# Patient Record
Sex: Male | Born: 1985 | Race: Black or African American | Hispanic: No | Marital: Single | Smoking: Former smoker
Health system: Southern US, Community
[De-identification: ages and names within clinical notes are randomized; demographics above are authoritative.]

## PROBLEM LIST (undated history)

## (undated) ENCOUNTER — Emergency Department (HOSPITAL_COMMUNITY): Admission: EM | Discharge status: Against Medical Advice | Payer: Self-pay

## (undated) DIAGNOSIS — K219 Gastro-esophageal reflux disease without esophagitis: Secondary | ICD-10-CM

## (undated) DIAGNOSIS — R578 Other shock: Secondary | ICD-10-CM

## (undated) DIAGNOSIS — S61512A Laceration without foreign body of left wrist, initial encounter: Secondary | ICD-10-CM

---
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
## 2012-11-20 DIAGNOSIS — D649 Anemia, unspecified: Secondary | ICD-10-CM

## 2012-11-20 DIAGNOSIS — Z9289 Personal history of other medical treatment: Secondary | ICD-10-CM

## 2012-11-20 HISTORY — DX: Personal history of other medical treatment: Z92.89

## 2012-11-20 HISTORY — DX: Anemia, unspecified: D64.9

## 2012-12-11 ENCOUNTER — Observation Stay (HOSPITAL_COMMUNITY)
Admission: EM | Admit: 2012-12-11 | Discharge: 2012-12-12 | Disposition: A | Discharge status: Home / Self Care | Payer: MEDICAID | Attending: General Surgery | Admitting: General Surgery

## 2012-12-11 DIAGNOSIS — R571 Hypovolemic shock: Secondary | ICD-10-CM

## 2012-12-11 DIAGNOSIS — S61509A Unspecified open wound of unspecified wrist, initial encounter: Principal | ICD-10-CM | POA: Insufficient documentation

## 2012-12-11 DIAGNOSIS — S55109A Unspecified injury of radial artery at forearm level, unspecified arm, initial encounter: Secondary | ICD-10-CM | POA: Insufficient documentation

## 2012-12-11 DIAGNOSIS — T794XXA Traumatic shock, initial encounter: Secondary | ICD-10-CM | POA: Insufficient documentation

## 2012-12-11 DIAGNOSIS — S61512A Laceration without foreign body of left wrist, initial encounter: Secondary | ICD-10-CM

## 2012-12-11 DIAGNOSIS — R578 Other shock: Secondary | ICD-10-CM | POA: Diagnosis present

## 2012-12-11 DIAGNOSIS — S66909A Unspecified injury of unspecified muscle, fascia and tendon at wrist and hand level, unspecified hand, initial encounter: Principal | ICD-10-CM | POA: Insufficient documentation

## 2012-12-11 DIAGNOSIS — D62 Acute posthemorrhagic anemia: Secondary | ICD-10-CM

## 2012-12-11 HISTORY — DX: Other shock: R57.8

## 2012-12-11 HISTORY — DX: Laceration without foreign body of left wrist, initial encounter: S61.512A

## 2012-12-12 ENCOUNTER — Encounter (HOSPITAL_COMMUNITY): Payer: Self-pay | Admitting: *Deleted

## 2012-12-12 ENCOUNTER — Emergency Department (HOSPITAL_COMMUNITY): Payer: Self-pay

## 2012-12-12 ENCOUNTER — Encounter (HOSPITAL_COMMUNITY): Admission: EM | Disposition: A | Discharge status: Home / Self Care | Payer: Self-pay | Source: Home / Self Care

## 2012-12-12 ENCOUNTER — Emergency Department (HOSPITAL_COMMUNITY): Payer: Self-pay | Admitting: Anesthesiology

## 2012-12-12 ENCOUNTER — Encounter (HOSPITAL_COMMUNITY): Payer: Self-pay | Admitting: Anesthesiology

## 2012-12-12 DIAGNOSIS — T794XXA Traumatic shock, initial encounter: Secondary | ICD-10-CM

## 2012-12-12 DIAGNOSIS — R578 Other shock: Secondary | ICD-10-CM | POA: Diagnosis present

## 2012-12-12 DIAGNOSIS — S61512A Laceration without foreign body of left wrist, initial encounter: Secondary | ICD-10-CM

## 2012-12-12 DIAGNOSIS — S61509A Unspecified open wound of unspecified wrist, initial encounter: Secondary | ICD-10-CM

## 2012-12-12 DIAGNOSIS — D62 Acute posthemorrhagic anemia: Secondary | ICD-10-CM

## 2012-12-12 HISTORY — DX: Other shock: R57.8

## 2012-12-12 HISTORY — PX: WOUND EXPLORATION: SHX6188

## 2012-12-12 HISTORY — DX: Laceration without foreign body of left wrist, initial encounter: S61.512A

## 2012-12-12 LAB — BASIC METABOLIC PANEL
CO2: 24 mEq/L (ref 19–32)
Calcium: 8.1 mg/dL — ABNORMAL LOW (ref 8.4–10.5)
Creatinine, Ser: 1.09 mg/dL (ref 0.50–1.35)
GFR calc Af Amer: >90 mL/min (ref >90)
Sodium: 139 mEq/L (ref 135–145)

## 2012-12-12 LAB — CBC
HCT: 35.7 % — ABNORMAL LOW (ref 39.0–52.0)
Hemoglobin: 14.3 g/dL (ref 13.0–17.0)
MCH: 28.4 pg (ref 26.0–34.0)
MCH: 28.6 pg (ref 26.0–34.0)
MCH: 28.9 pg (ref 26.0–34.0)
MCHC: 35.3 g/dL (ref 30.0–36.0)
MCV: 84.6 fL (ref 78.0–100.0)
Platelets: 148 10*3/uL — ABNORMAL LOW (ref 150–400)
RBC: 4.51 MIL/uL (ref 4.22–5.81)
RBC: 4.95 MIL/uL (ref 4.22–5.81)
RDW: 14.7 % (ref 11.5–15.5)
RDW: 14.8 % (ref 11.5–15.5)
WBC: 17.6 10*3/uL — ABNORMAL HIGH (ref 4.0–10.5)

## 2012-12-12 LAB — POCT I-STAT, CHEM 8
Chloride: 107 mEq/L (ref 96–112)
Glucose, Bld: 194 mg/dL — ABNORMAL HIGH (ref 70–99)
HCT: 38 % — ABNORMAL LOW (ref 39.0–52.0)
Potassium: 3.7 mEq/L (ref 3.5–5.1)
Sodium: 140 mEq/L (ref 135–145)

## 2012-12-12 LAB — COMPREHENSIVE METABOLIC PANEL
AST: 19 U/L (ref 0–37)
CO2: 19 mEq/L (ref 19–32)
Calcium: 7.5 mg/dL — ABNORMAL LOW (ref 8.4–10.5)
Creatinine, Ser: 1.13 mg/dL (ref 0.50–1.35)
GFR calc Af Amer: >90 mL/min (ref >90)
GFR calc non Af Amer: 87 mL/min — ABNORMAL LOW (ref >90)
Total Protein: 5 g/dL — ABNORMAL LOW (ref 6.0–8.3)

## 2012-12-12 LAB — POCT I-STAT 4, (NA,K, GLUC, HGB,HCT)
HCT: 38 % — ABNORMAL LOW (ref 39.0–52.0)
Hemoglobin: 12.9 g/dL — ABNORMAL LOW (ref 13.0–17.0)
Potassium: 4.1 mEq/L (ref 3.5–5.1)
Sodium: 140 mEq/L (ref 135–145)

## 2012-12-12 LAB — MRSA PCR SCREENING: MRSA by PCR: NEGATIVE

## 2012-12-12 LAB — MASSIVE TRANSFUSION PROTOCOL ORDER (BLOOD BANK NOTIFICATION)

## 2012-12-12 SURGERY — WOUND EXPLORATION
Anesthesia: General | Site: Arm Lower | Laterality: Left | Wound class: Dirty or Infected

## 2012-12-12 MED ORDER — DOCUSATE SODIUM 100 MG PO CAPS
100.0000 mg | ORAL_CAPSULE | Freq: Two times a day (BID) | ORAL | Status: DC
  Filled 2012-12-12: qty 1

## 2012-12-12 MED ORDER — SODIUM CHLORIDE 0.9 % IV SOLN
INTRAVENOUS | Status: AC | PRN
  Administered 2012-12-12 (×3): 1000 mL via INTRAVENOUS

## 2012-12-12 MED ORDER — PANTOPRAZOLE SODIUM 40 MG PO TBEC
40.0000 mg | DELAYED_RELEASE_TABLET | Freq: Every day | ORAL | Status: DC
  Filled 2012-12-12: qty 1

## 2012-12-12 MED ORDER — MORPHINE SULFATE 2 MG/ML IJ SOLN
1.0000 mg | INTRAMUSCULAR | Status: DC | PRN
  Administered 2012-12-12 (×3): 4 mg via INTRAVENOUS
  Filled 2012-12-12 (×3): qty 2

## 2012-12-12 MED ORDER — ACETAMINOPHEN 325 MG PO TABS: 650.0000 mg | ORAL_TABLET | Freq: Four times a day (QID) | ORAL | Status: DC | PRN

## 2012-12-12 MED ORDER — CEFAZOLIN SODIUM-DEXTROSE 2-3 GM-% IV SOLR
INTRAVENOUS | Status: DC | PRN
  Administered 2012-12-12: 2 g via INTRAVENOUS

## 2012-12-12 MED ORDER — OXYCODONE HCL 5 MG PO TABS: 5.0000 mg | ORAL_TABLET | Freq: Four times a day (QID) | ORAL | Status: DC | PRN

## 2012-12-12 MED ORDER — BUPIVACAINE HCL 0.5 % IJ SOLN
INTRAMUSCULAR | Status: DC | PRN
  Administered 2012-12-12: 10 mL

## 2012-12-12 MED ORDER — DEXTROSE 50 % IV SOLN
INTRAVENOUS | Status: AC
  Filled 2012-12-12: qty 50

## 2012-12-12 MED ORDER — PROMETHAZINE HCL 25 MG/ML IJ SOLN: 6.2500 mg | INTRAMUSCULAR | Status: DC | PRN

## 2012-12-12 MED ORDER — OXYCODONE HCL 5 MG PO TABS: 2.5000 mg | ORAL_TABLET | ORAL | Status: DC | PRN

## 2012-12-12 MED ORDER — TETANUS-DIPHTH-ACELL PERTUSSIS 5-2.5-18.5 LF-MCG/0.5 IM SUSP
0.5000 mL | Freq: Once | INTRAMUSCULAR | Status: AC
  Administered 2012-12-12: 0.5 mL via INTRAMUSCULAR

## 2012-12-12 MED ORDER — SODIUM CHLORIDE 0.9 % IV SOLN
INTRAVENOUS | Status: DC | PRN
  Administered 2012-12-12: 01:00:00 via INTRAVENOUS

## 2012-12-12 MED ORDER — FENTANYL CITRATE 0.05 MG/ML IJ SOLN
INTRAMUSCULAR | Status: DC | PRN
  Administered 2012-12-12 (×2): 50 ug via INTRAVENOUS

## 2012-12-12 MED ORDER — PANTOPRAZOLE SODIUM 40 MG IV SOLR
40.0000 mg | Freq: Every day | INTRAVENOUS | Status: DC
  Filled 2012-12-12: qty 40

## 2012-12-12 MED ORDER — ONDANSETRON HCL 4 MG/2ML IJ SOLN
INTRAMUSCULAR | Status: DC | PRN
  Administered 2012-12-12: 4 mg via INTRAVENOUS

## 2012-12-12 MED ORDER — ONDANSETRON HCL 4 MG PO TABS: 4.0000 mg | ORAL_TABLET | Freq: Four times a day (QID) | ORAL | Status: DC | PRN

## 2012-12-12 MED ORDER — OXYCODONE HCL 5 MG PO TABS: 5.0000 mg | ORAL_TABLET | Freq: Once | ORAL | Status: DC | PRN

## 2012-12-12 MED ORDER — LIDOCAINE HCL (CARDIAC) 20 MG/ML IV SOLN
INTRAVENOUS | Status: DC | PRN
  Administered 2012-12-12: 100 mg via INTRAVENOUS

## 2012-12-12 MED ORDER — HYDROMORPHONE HCL PF 1 MG/ML IJ SOLN: 0.2500 mg | INTRAMUSCULAR | Status: DC | PRN

## 2012-12-12 MED ORDER — TETANUS-DIPHTH-ACELL PERTUSSIS 5-2.5-18.5 LF-MCG/0.5 IM SUSP
INTRAMUSCULAR | Status: AC
  Filled 2012-12-12: qty 0.5

## 2012-12-12 MED ORDER — OXYCODONE HCL 5 MG/5ML PO SOLN: 5.0000 mg | Freq: Once | ORAL | Status: DC | PRN

## 2012-12-12 MED ORDER — OXYCODONE HCL 5 MG PO TABS: 5.0000 mg | ORAL_TABLET | ORAL | Status: DC | PRN

## 2012-12-12 MED ORDER — DEXTROSE IN LACTATED RINGERS 5 % IV SOLN
INTRAVENOUS | Status: DC
  Administered 2012-12-12 (×2): via INTRAVENOUS

## 2012-12-12 MED ORDER — CEPHALEXIN 500 MG PO CAPS
500.0000 mg | ORAL_CAPSULE | Freq: Four times a day (QID) | ORAL | Status: DC
  Administered 2012-12-12: 500 mg via ORAL
  Filled 2012-12-12: qty 1

## 2012-12-12 MED ORDER — FENTANYL CITRATE 0.05 MG/ML IJ SOLN: 50.0000 ug | Freq: Once | INTRAMUSCULAR | Status: DC

## 2012-12-12 MED ORDER — LACTATED RINGERS IV SOLN
INTRAVENOUS | Status: DC | PRN
  Administered 2012-12-12: 02:00:00 via INTRAVENOUS

## 2012-12-12 MED ORDER — MIDAZOLAM HCL 2 MG/2ML IJ SOLN: 1.0000 mg | INTRAMUSCULAR | Status: DC | PRN

## 2012-12-12 MED ORDER — SUCCINYLCHOLINE CHLORIDE 20 MG/ML IJ SOLN
INTRAMUSCULAR | Status: DC | PRN
  Administered 2012-12-12: 140 mg via INTRAVENOUS

## 2012-12-12 MED ORDER — PROPOFOL 10 MG/ML IV BOLUS
INTRAVENOUS | Status: DC | PRN
  Administered 2012-12-12: 180 mg via INTRAVENOUS

## 2012-12-12 MED ORDER — ONDANSETRON HCL 4 MG/2ML IJ SOLN: 4.0000 mg | Freq: Four times a day (QID) | INTRAMUSCULAR | Status: DC | PRN

## 2012-12-12 MED ORDER — CEPHALEXIN 500 MG PO CAPS: 500.0000 mg | ORAL_CAPSULE | Freq: Four times a day (QID) | ORAL | Status: DC

## 2012-12-12 MED ORDER — OXYCODONE HCL 5 MG PO TABS: 10.0000 mg | ORAL_TABLET | ORAL | Status: DC | PRN

## 2012-12-12 MED ORDER — IBUPROFEN 200 MG PO TABS: ORAL_TABLET | ORAL | Status: DC

## 2012-12-12 MED ORDER — 0.9 % SODIUM CHLORIDE (POUR BTL) OPTIME
TOPICAL | Status: DC | PRN
  Administered 2012-12-12: 1000 mL

## 2012-12-12 SURGICAL SUPPLY — 33 items
BANDAGE ELASTIC 3 VELCRO ST LF (GAUZE/BANDAGES/DRESSINGS) ×1 IMPLANT
CANISTER SUCTION 2500CC (MISCELLANEOUS) ×1 IMPLANT
CLOTH BEACON ORANGE TIMEOUT ST (SAFETY) ×1 IMPLANT
CORDS BIPOLAR (ELECTRODE) ×1 IMPLANT
COVER LIGHT HANDLE  DEROYL (MISCELLANEOUS) ×1 IMPLANT
GAUZE XEROFORM 5X9 LF (GAUZE/BANDAGES/DRESSINGS) ×1 IMPLANT
GLOVE BIO SURGEON STRL SZ7 (GLOVE) ×1 IMPLANT
GLOVE BIOGEL M STRL SZ7.5 (GLOVE) ×1 IMPLANT
GLOVE BIOGEL PI IND STRL 7.0 (GLOVE) IMPLANT
GLOVE BIOGEL PI INDICATOR 7.0 (GLOVE) ×1
GLOVE SURG SS PI 7.0 STRL IVOR (GLOVE) ×2 IMPLANT
GOWN PREVENTION PLUS XLARGE (GOWN DISPOSABLE) ×1 IMPLANT
GOWN STRL NON-REIN LRG LVL3 (GOWN DISPOSABLE) ×1 IMPLANT
KIT BASIN OR (CUSTOM PROCEDURE TRAY) ×1 IMPLANT
NEEDLE 22X1 1/2 (OR ONLY) (NEEDLE) ×1 IMPLANT
PACK ORTHO EXTREMITY (CUSTOM PROCEDURE TRAY) ×1 IMPLANT
PADDING CAST ABS 4INX4YD NS (CAST SUPPLIES) ×1
PADDING CAST ABS COTTON 4X4 ST (CAST SUPPLIES) IMPLANT
SPLINT FIBERGLASS 3X12 (CAST SUPPLIES) ×1 IMPLANT
SPONGE GAUZE 4X4 12PLY (GAUZE/BANDAGES/DRESSINGS) ×1 IMPLANT
SPONGE LAP 4X18 X RAY DECT (DISPOSABLE) ×1 IMPLANT
STAPLER VISISTAT 35W (STAPLE) ×1 IMPLANT
STOCKINETTE 4X48 STRL (DRAPES) ×1 IMPLANT
SUT PROLENE 4 0 P 3 18 (SUTURE) ×1 IMPLANT
SUT PROLENE 4 0 PS 2 18 (SUTURE) ×1 IMPLANT
SUT PROLENE 5 0 RB 1 DA (SUTURE) ×1 IMPLANT
SUT VIC AB 4-0 P-3 18X BRD (SUTURE) IMPLANT
SUT VIC AB 4-0 P3 18 (SUTURE) ×4
SYR CONTROL 10ML LL (SYRINGE) ×1 IMPLANT
TOWEL OR 17X24 6PK STRL BLUE (TOWEL DISPOSABLE) ×1 IMPLANT
TOWEL OR 17X26 10 PK STRL BLUE (TOWEL DISPOSABLE) ×1 IMPLANT
TUBE SUCT ARGYLE STRL (TUBING) ×1 IMPLANT
YANKAUER SUCT BULB TIP NO VENT (SUCTIONS) ×1 IMPLANT

## 2012-12-12 NOTE — OR Nursing (Signed)
Crime Scene Investigator present in OR pre-operatively to take pictures of patient and wound with permission of surgeon.

## 2012-12-12 NOTE — Op Note (Signed)
Alejandro Shelton, Alejandro Shelton          ACCOUNT NO.:  [id number]  MEDICAL RECORD NO.:  [id number]  LOCATION:  OTFC                         FACILITY:  [facility]  PHYSICIAN:  Johnette Abraham, MD    DATE OF BIRTH:  10-22-1985  DATE OF PROCEDURE:  12/12/2012 DATE OF DISCHARGE:                              OPERATIVE REPORT   PREOPERATIVE DIAGNOSIS:  Complex laceration of the left wrist with hemodynamic instability.  POSTOPERATIVE DIAGNOSIS:  Complex laceration of the left wrist with hemodynamic instability.  PROCEDURE:  Exploration of complex wound, ligation of radial artery. Repair of the abductor pollicis longus tendon.  Repair of the extensor pollicis longus tendon.  Release of the first dorsal compartment with reconstruction of the first dorsal compartment.  Repair of the extensor pollicis brevis tendon, repair of ERCL.  INDICATIONS:  Alejandro Shelton is a black male, who presented to the emergency room with severe bleeding and hemodynamic instability.  On evaluation, he had a very large complex laceration to the distal wrist on the left side.  Upon entering the [facility], he was stabilized.  A tourniquet was inflated  on the left arm to control the bleeding from the laceration.  On my initial evaluation, the hand was cool.  The patient was unable to move the digits.  The dressing was not taken down at this time due to hemodynamic instability and active bleeding.  The patient was consented verbally and taken to the operating room.  DESCRIPTION OF PROCEDURE:  The patient was taken to the operating room, placed supine on the operating room table.  The originally tourniquet was released and a new tourniquet was placed and inflated.  The dressing was removed.  There was a large gaping wound overlying  the first dorsal compartment that extended volarly and dorsally.  There was obvious tendon involvement and most likely arterial injury due to the severity of the bleeding.  The wound was  lengthened both proximally and distally to gain adequate exploration of the structures.  Immediately there was evidence that there was a radial artery injury.  There was a skiving almost splaying of the radial artery lumen for approximately 1 inch. Primary repair was not possible and therefore each end was grasped and suture ligated. Next, further exploration was performed.  Volarly  the FCR tendon was intact and the median nerve was intact.  There was laceration and shredding of the radial dorsal sensory branch. Several branches were resected and placed deep in the soft tissue to avoid neuroma.  There was laceration to both tendons in the first dorsal compartment, each of these were grasped and repaired with modified Kessler repair with 4-0 FiberWire and a epitendinous 5-0 Prolene suture.  The first dorsal compartment was then taken down in a stepwise fashion and Repaired at the conclusion of the tendon repair to prevent bowstringing.   Next, there was evidence that the extensor pollicis longus tendon was lacerated.  The incision had to be lengthened just proximal to Listers tubercle, the EPL tendon was located and both ends were brought in approximation and again repaired with 4-0 FiberWire in a modified Kessler repair with epitendinous 5-0 Prolene suture repair as well.  The ECRL was also repaired in a  similar fashion.  The wound was thoroughly irrigated.  There was entry into the radiocarpal joint. Irrigation of the wrist joint was then performed with approximately 1 L of saline solution.  Afterwards the soft tissues were reapproximated over the tendon repairs with 4-0 Vicryl.  The tourniquet was released. Hemostasis was complete.  Approximately 10 mL of 0.5% plain Marcaine were infiltrated proximally to the wound and around the wound edges for postoperative pain control.  The wound totaling approximately 15 cm was closed in layers.  Xeroform ointment, sterile dressing, and  thumb spica splint were placed.  The patient tolerated the procedure well, was hemodynamic throughout the procedure.  Urine output was approximately 200 mL for the case was approximately an hour and a half.     Johnette Abraham, MD     HCC/MEDQ  D:  12/12/2012  T:  12/12/2012  Job:  [id number]
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
## 2012-12-12 NOTE — ED Notes (Addendum)
Emergency release blood rapid infusion initiated, [facility] unit number [id number]
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
## 2012-12-12 NOTE — ED Notes (Addendum)
Emergency release blood unit #2 started via rapid infuser, [facility] unit number [id number]
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
## 2012-12-12 NOTE — Progress Notes (Signed)
Alejandro Shelton. Corliss Skains, MD, [facility]  General/ Trauma Surgery  12/12/2012 4:42 PM

## 2012-12-12 NOTE — ED Notes (Signed)
Emergency release blood third unit being infused via rapid infuser, unit number [id number]

## 2012-12-12 NOTE — Discharge Summary (Signed)
Wilmon Arms. Corliss Skains, MD, [facility]  General/ Trauma Surgery  12/12/2012 4:56 PM

## 2012-12-12 NOTE — Transfer of Care (Signed)
Immediate Anesthesia Transfer of Care Note  Patient: Alejandro Shelton  Procedure(s) Performed: Procedure(s): WOUND EXPLORATION, LIGATION BLEEDING VESSEL, REPAIR MULTIPLE TENDONS LEFT ARM (Left)  Patient Location: PACU  Anesthesia Type:General  Level of Consciousness: oriented, sedated and patient cooperative  Airway & Oxygen Therapy: Patient connected to nasal cannula oxygen  Post-op Assessment: Report given to PACU RN, Post -op Vital signs reviewed and stable and Patient moving all extremities X 4  Post vital signs: Reviewed and stable  Complications: No apparent anesthesia complications

## 2012-12-12 NOTE — Anesthesia Procedure Notes (Signed)
Procedure Name: Intubation Date/Time: 12/12/2012 1:16 AM Performed by: Molli Hazard Pre-anesthesia Checklist: Patient identified, Emergency Drugs available, Suction available and Patient being monitored Patient Re-evaluated:Patient Re-evaluated prior to inductionOxygen Delivery Method: Circle system utilized Preoxygenation: Pre-oxygenation with 100% oxygen Intubation Type: IV induction, Rapid sequence and Cricoid Pressure applied Laryngoscope Size: Miller and 2 Grade View: Grade I Tube type: Oral Tube size: 7.5 mm Number of attempts: 1 Airway Equipment and Method: Stylet Placement Confirmation: ETT inserted through vocal cords under direct vision,  positive ETCO2 and breath sounds checked- equal and bilateral Secured at: 22 cm Tube secured with: Tape Dental Injury: Teeth and Oropharynx as per pre-operative assessment

## 2012-12-12 NOTE — Progress Notes (Signed)
Utilization Review completed.  
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
## 2012-12-12 NOTE — Progress Notes (Signed)
S:  Pt with mild hand discomfort  O:Blood pressure 137/79, pulse 86, temperature 98.6 F (37 C), temperature source Oral, resp. rate 19, height 5\' 11"  (1.803 m), weight 86.4 kg (190 lb 7.6 oz), SpO2 99.00%. Results for orders placed during the hospital encounter of 12/11/12  MRSA PCR SCREENING     Status: None   Collection Time    12/12/12  5:02 AM      Result Value Range Status   MRSA by PCR NEGATIVE  NEGATIVE Final   Comment:            The GeneXpert MRSA Assay (FDA     approved for NASAL specimens     only), is one component of a     comprehensive MRSA colonization     surveillance program. It is not     intended to diagnose MRSA     infection nor to guide or     monitor treatment for     MRSA infections.    L hand/fingers: n/v intact, finger tips warm, able to move all fingers except for thumb - splint  A:  S/p laceration repair, multiple tendon repair   P:cont elevation, finger motion, splint, NWB L thumb, would give oral abx for 7-10 days; f/u in office in 2 wks

## 2012-12-12 NOTE — Anesthesia Postprocedure Evaluation (Signed)
  Anesthesia Post-op Note  Patient: Alejandro Shelton  Procedure(s) Performed: Procedure(s): WOUND EXPLORATION, LIGATION BLEEDING VESSEL, REPAIR MULTIPLE TENDONS LEFT ARM (Left)  Patient Location: PACU  Anesthesia Type:General  Level of Consciousness: awake  Airway and Oxygen Therapy: Patient Spontanous Breathing  Post-op Pain: mild  Post-op Assessment: Post-op Vital signs reviewed, Patient's Cardiovascular Status Stable, Respiratory Function Stable, Patent Airway, No signs of Nausea or vomiting and Pain level controlled  Post-op Vital Signs: stable  Complications: No apparent anesthesia complications
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
## 2012-12-12 NOTE — Progress Notes (Signed)
Pt tx 6N per MD order, pt VSS, family at [facility], pt and family verbalized understanding of tx, report called to receiving RN, all questions asnwered

## 2012-12-12 NOTE — Progress Notes (Signed)
Day of Surgery  Subjective: Hemodynamically stable Complains of left wrist pain  Objective: Vital signs in last 24 hours: Temp:  [97.6 F (36.4 C)-98.6 F (37 C)] 98.6 F (37 C) (08/23 0700) Pulse Rate:  [76-121] 76 (08/23 0700) Resp:  [11-54] 11 (08/23 0700) BP: (55-138)/(38-88) 127/60 mmHg (08/23 0700) SpO2:  [95 %-100 %] 100 % (08/23 0700) FiO2 (%):  [28 %] 28 % (08/23 0314) Weight:  [190 lb 7.6 oz (86.4 kg)-200 lb (90.719 kg)] 190 lb 7.6 oz (86.4 kg) (08/23 0403)    Intake/Output from previous day: 08/22 0701 - 08/23 0700 In: 5150 [I.V.:4250; Blood:900] Out: 200 [Urine:200] Intake/Output this shift: Total I/O In: 363.3 [I.V.:363.3] Out: -   Lungs clear CV RRR Left arm dressing intact.  Moves fingers  Lab Results:   Recent Labs  12/12/12 0008  12/12/12 0135 12/12/12 0526  WBC 9.7  --   --  20.6*  HGB 14.3  < > 12.9* 12.6*  HCT 41.9  < > 38.0* 35.7*  PLT 302  --   --  150  < > = values in this interval not displayed. BMET  Recent Labs  12/12/12 0130 12/12/12 0135 12/12/12 0526  NA 138 140 139  K 4.1 4.1 4.1  CL 106  --  106  CO2 19  --  24  GLUCOSE 122* 122* 129*  BUN 10  --  10  CREATININE 1.13  --  1.09  CALCIUM 7.5*  --  8.1*   PT/INR  Recent Labs  12/12/12 0008  LABPROT 15.2  INR 1.23   ABG No results found for this basename: PHART, PCO2, PO2, HCO3,  in the last 72 hours  Studies/Results: Dg Hand 2 View Left  12/12/2012   *RADIOLOGY REPORT*  Clinical Data: Trauma.  Laceration to the left wrist and hand.  LEFT HAND - 2 VIEW  Comparison: None.  Findings: Limited two-view study with overlying gauze material limits bone detail.  There is no definite evidence of any acute fracture or displacement of the left wrist. No radiopaque soft tissue foreign bodies are demonstrated.  No focal bone lesion or bone destruction.  IMPRESSION: Overlying gauze material limits technique.  No gross evidence of any acute bony abnormality or radiopaque foreign  bodies.   Original Report Authenticated By: Burman Nieves, M.D.   Dg Chest Port 1 View  12/12/2012   CLINICAL DATA:  Trauma.  EXAM: PORTABLE CHEST - 1 VIEW  COMPARISON:  None.  FINDINGS: The heart size and mediastinal contours are within normal limits. Both lungs are clear.  IMPRESSION: Normal examination.   Electronically Signed   By: Gordan Payment   On: 12/12/2012 00:43    Anti-infectives: Anti-infectives   None      Assessment/Plan: s/p Procedure(s): WOUND EXPLORATION, LIGATION BLEEDING VESSEL, REPAIR MULTIPLE TENDONS LEFT ARM (Left)  Transfer to floor Pain control  LOS: 1 day    Alejandro Shelton 12/12/2012

## 2012-12-12 NOTE — Progress Notes (Signed)
Because of emergent nature of pt's condition, no formal informed consent was signed by patient prior to surgery.  Risks of pt losing is hand was explained to the patient in the Trauma room prior to taking patient to the OR.  The patient expressed understanding of the seriousness of the injury and risks of the surgery.

## 2012-12-13 LAB — TYPE AND SCREEN
Antibody Screen: NEGATIVE
Unit division: 0
Unit division: 0
Unit division: 0
Unit division: 0
Unit division: 0

## 2012-12-13 LAB — PREPARE FRESH FROZEN PLASMA
Unit division: 0
Unit division: 0

## 2012-12-15 ENCOUNTER — Encounter (HOSPITAL_COMMUNITY): Payer: Self-pay | Admitting: General Surgery

## 2012-12-29 ENCOUNTER — Telehealth (HOSPITAL_COMMUNITY): Payer: Self-pay | Admitting: Emergency Medicine

## 2012-12-29 ENCOUNTER — Telehealth (INDEPENDENT_AMBULATORY_CARE_PROVIDER_SITE_OTHER): Payer: Self-pay

## 2012-12-29 NOTE — Telephone Encounter (Signed)
Pt called requesting pain med refill. Pt directed to call Dr Izora Ribas for medication refills for pain from wrist surgery. Pt also given trauma # if he has any questions re: this trauma surgery. Pt states he understands and will call Dr Izora Ribas.

## 2012-12-29 NOTE — Telephone Encounter (Signed)
Referred patient to call Dr. Debby Bud office for any medication refills.

## 2013-02-25 ENCOUNTER — Other Ambulatory Visit: Payer: Self-pay

## 2016-04-18 ENCOUNTER — Encounter (HOSPITAL_COMMUNITY): Payer: Self-pay | Admitting: Family Medicine

## 2016-04-18 ENCOUNTER — Ambulatory Visit (HOSPITAL_COMMUNITY)
Admission: EM | Admit: 2016-04-18 | Discharge: 2016-04-18 | Disposition: A | Discharge status: Home / Self Care | Payer: Self-pay | Attending: Family Medicine | Admitting: Family Medicine

## 2016-04-18 DIAGNOSIS — A64 Unspecified sexually transmitted disease: Secondary | ICD-10-CM

## 2016-04-18 DIAGNOSIS — N342 Other urethritis: Secondary | ICD-10-CM

## 2016-04-18 DIAGNOSIS — F17210 Nicotine dependence, cigarettes, uncomplicated: Secondary | ICD-10-CM | POA: Insufficient documentation

## 2016-04-18 MED ORDER — LIDOCAINE HCL (PF) 1 % IJ SOLN
INTRAMUSCULAR | Status: AC
  Filled 2016-04-18: qty 2

## 2016-04-18 MED ORDER — CEFTRIAXONE SODIUM 250 MG IJ SOLR
INTRAMUSCULAR | Status: AC
  Filled 2016-04-18: qty 250

## 2016-04-18 MED ORDER — AZITHROMYCIN 250 MG PO TABS
1000.0000 mg | ORAL_TABLET | Freq: Once | ORAL | Status: AC
  Administered 2016-04-18: 1000 mg via ORAL

## 2016-04-18 MED ORDER — AZITHROMYCIN 250 MG PO TABS
ORAL_TABLET | ORAL | Status: AC
  Filled 2016-04-18: qty 4

## 2016-04-18 MED ORDER — CEFTRIAXONE SODIUM 250 MG IJ SOLR
250.0000 mg | Freq: Once | INTRAMUSCULAR | Status: AC
  Administered 2016-04-18: 250 mg via INTRAMUSCULAR

## 2016-04-18 NOTE — ED Provider Notes (Signed)
CSN: [id number]     Arrival date & time 04/18/16  1032 History   First MD Initiated Contact with Patient 04/18/16 1133     Chief Complaint  Patient presents with  . Penile Discharge   (Consider location/radiation/quality/duration/timing/severity/associated sxs/prior Treatment) Patient c/o penile discharge for 2 days.  He admits to having unprotected sex recently.   The history is provided by the patient.  Penile Discharge  This is a new problem. The current episode started 2 days ago. The problem occurs constantly. The problem has not changed since onset.Nothing aggravates the symptoms.    Past Medical History:  Diagnosis Date  . Hemorrhagic shock 12/12/2012  . Laceration of wrist, left, complicated 12/12/2012   Past Surgical History:  Procedure Laterality Date  . WOUND EXPLORATION Left 12/12/2012   Procedure: WOUND EXPLORATION, LIGATION BLEEDING VESSEL, REPAIR MULTIPLE TENDONS LEFT ARM;  Surgeon: Knute NeuHarrill Coley, MD;  Location: [facility] OR;  Service: Plastics;  Laterality: Left;   History reviewed. No pertinent family history. Social History  Substance Use Topics  . Smoking status: Current Every Day Smoker    Packs/day: 1.00    Years: 10.00  . Smokeless tobacco: Current User  . Alcohol use 2.5 oz/week    5 drink(s) per week    Review of Systems  Constitutional: Negative.   HENT: Negative.   Eyes: Negative.   Respiratory: Negative.   Cardiovascular: Negative.   Gastrointestinal: Negative.   Endocrine: Negative.   Genitourinary: Positive for discharge.  Musculoskeletal: Negative.   Allergic/Immunologic: Negative.   Neurological: Negative.   Hematological: Negative.   Psychiatric/Behavioral: Negative.     Allergies  Patient has no known allergies.  Home Medications   Prior to Admission medications   Not on File   Meds Ordered and Administered this Visit   Medications  azithromycin (ZITHROMAX) tablet 1,000 mg (not administered)  cefTRIAXone (ROCEPHIN) injection 250  mg (not administered)    BP 137/80   Pulse 72   Temp 98.6 F (37 C)   Resp 18   SpO2 100%  No data found.   Physical Exam  Constitutional: He is oriented to person, place, and time. He appears well-developed and well-nourished.  HENT:  Head: Normocephalic and atraumatic.  Eyes: EOM are normal. Pupils are equal, round, and reactive to light.  Neck: Normal range of motion. Neck supple.  Cardiovascular: Normal rate, regular rhythm and normal heart sounds.   Pulmonary/Chest: Effort normal and breath sounds normal.  Genitourinary: Penile tenderness present.  Genitourinary Comments: Urethral DC clear whitish  Neurological: He is alert and oriented to person, place, and time.  Nursing note and vitals reviewed.   Urgent Care Course   Clinical Course     Procedures (including critical care time)  Labs Review Labs Reviewed  URINE CYTOLOGY ANCILLARY ONLY    Imaging Review No results found.   Visual Acuity Review  Right Eye Distance:   Left Eye Distance:   Bilateral Distance:    Right Eye Near:   Left Eye Near:    Bilateral Near:         MDM   1. STD (male)   2. Urethritis    Rocephin 250mg  IM Azithromycin 250mg  x4  Urine cytology GC/chlamydia and trichomonas      Alejandro CanterWilliam J Israella Hubert, FNP 04/18/16 1158

## 2016-04-18 NOTE — ED Triage Notes (Signed)
Pt here for clear whit discharge coming from penis x 2 days. denise pain.

## 2016-04-18 NOTE — ED Notes (Signed)
Urine specimen obtained and specimen in lab

## 2016-04-19 LAB — URINE CYTOLOGY ANCILLARY ONLY
Chlamydia: NEGATIVE
Neisseria Gonorrhea: NEGATIVE
Trichomonas: NEGATIVE

## 2018-10-23 ENCOUNTER — Encounter (HOSPITAL_COMMUNITY): Payer: Self-pay | Admitting: Emergency Medicine

## 2018-10-23 ENCOUNTER — Ambulatory Visit (HOSPITAL_COMMUNITY)
Admission: EM | Admit: 2018-10-23 | Discharge: 2018-10-23 | Disposition: A | Discharge status: Home / Self Care | Payer: 59 | Attending: Family Medicine | Admitting: Family Medicine

## 2018-10-23 DIAGNOSIS — R369 Urethral discharge, unspecified: Secondary | ICD-10-CM | POA: Diagnosis present

## 2018-10-23 MED ORDER — AZITHROMYCIN 250 MG PO TABS
ORAL_TABLET | ORAL | Status: AC
  Filled 2018-10-23: qty 4

## 2018-10-23 MED ORDER — AZITHROMYCIN 250 MG PO TABS
1000.0000 mg | ORAL_TABLET | Freq: Once | ORAL | Status: AC
  Administered 2018-10-23: 1000 mg via ORAL

## 2018-10-23 MED ORDER — CEFTRIAXONE SODIUM 250 MG IJ SOLR
250.0000 mg | Freq: Once | INTRAMUSCULAR | Status: AC
  Administered 2018-10-23: 250 mg via INTRAMUSCULAR

## 2018-10-23 MED ORDER — CEFTRIAXONE SODIUM 250 MG IJ SOLR
INTRAMUSCULAR | Status: AC
  Filled 2018-10-23: qty 250

## 2018-10-23 NOTE — Discharge Instructions (Signed)
Avoid all forms of sexual intercourse (oral, vaginal, anal) for the next 7 days to avoid spreading/reinfecting. Return if symptoms worsen/do not resolve, you develop fever, abdominal pain, blood in your urine, or are re-exposed to an STI.  

## 2018-10-23 NOTE — ED Notes (Signed)
Sent patient to obtain a urine specimen. Specimen in lab

## 2018-10-23 NOTE — ED Provider Notes (Signed)
[facility]    CSN: [id number] Arrival date & time: 10/23/18  0932     History   Chief Complaint Chief Complaint  Patient presents with  . Penile Discharge    HPI Alejandro Shelton is a 33 y.o. male presenting for acute concern of penile discharge.  Patient states that he noticed discharge yesterday, was worse this morning.  Patient does endorse remote history of chlamydia, status post treatment.  Patient denies penile or testicular pain or swelling, dysuria, urinary frequency, hematuria, fever or abdominal pain.  Patient currently sexually active with 1 male partner, not routinely wearing condoms.   Past Medical History:  Diagnosis Date  . Hemorrhagic shock (HCC) 12/12/2012  . Laceration of wrist, left, complicated 12/12/2012    Patient Active Problem List   Diagnosis Date Noted  . Hemorrhagic shock (HCC) 12/12/2012  . Laceration of wrist, left, complicated 12/12/2012    Past Surgical History:  Procedure Laterality Date  . WOUND EXPLORATION Left 12/12/2012   Procedure: WOUND EXPLORATION, LIGATION BLEEDING VESSEL, REPAIR MULTIPLE TENDONS LEFT ARM;  Surgeon: Knute NeuHarrill Coley, MD;  Location: [facility] OR;  Service: Plastics;  Laterality: Left;       Home Medications    Prior to Admission medications   Not on File    Family History No family history on file.  Social History Social History   Tobacco Use  . Smoking status: Current Every Day Smoker    Packs/day: 1.00    Years: 10.00    Pack years: 10.00  . Smokeless tobacco: Current User  Substance Use Topics  . Alcohol use: Yes    Types: 5 drink(s) per week  . Drug use: Not on file     Allergies   Patient has no known allergies.   Review of Systems As per HPI   Physical Exam Triage Vital Signs ED Triage Vitals  Enc Vitals Group     BP 10/23/18 0947 133/74     Pulse Rate 10/23/18 0947 86     Resp 10/23/18 0947 18     Temp 10/23/18 0947 98.3 F (36.8 C)     Temp Source 10/23/18 0947 Oral   SpO2 10/23/18 0947 100 %     Weight --      Height --      Head Circumference --      Peak Flow --      Pain Score 10/23/18 0953 0     Pain Loc --      Pain Edu? --      Excl. in GC? --    No data found.  Updated Vital Signs BP 133/74 (BP Location: Right Arm)   Pulse 86   Temp 98.3 F (36.8 C) (Oral)   Resp 18   SpO2 100%   Visual Acuity Right Eye Distance:   Left Eye Distance:   Bilateral Distance:    Right Eye Near:   Left Eye Near:    Bilateral Near:     Physical Exam Constitutional:      General: He is not in acute distress. HENT:     Head: Normocephalic and atraumatic.  Eyes:     General: No scleral icterus.    Pupils: Pupils are equal, round, and reactive to light.  Cardiovascular:     Rate and Rhythm: Normal rate.  Pulmonary:     Effort: Pulmonary effort is normal.  Skin:    Coloration: Skin is not jaundiced or pale.  Neurological:     Mental Status:  He is alert and oriented to person, place, and time.      UC Treatments / Results  Labs (all labs ordered are listed, but only abnormal results are displayed) Labs Reviewed  [place]    EKG   Radiology No results found.  Procedures Procedures (including critical care time)  Medications Ordered in UC Medications  cefTRIAXone (ROCEPHIN) injection 250 mg (250 mg Intramuscular Given 10/23/18 1027)  azithromycin (ZITHROMAX) tablet 1,000 mg (1,000 mg Oral Given 10/23/18 1027)  azithromycin (ZITHROMAX) 250 MG tablet (has no administration in time range)  cefTRIAXone (ROCEPHIN) 250 MG injection (has no administration in time range)    Initial Impression / Assessment and Plan / UC Course  I have reviewed the triage vital signs and the nursing notes.  Pertinent labs & imaging results that were available during my care of the patient were reviewed by me and considered in my medical decision making (see chart for details).     33 year old male with remote history of chlamydia  presenting for penile discharge.  Patient requesting treatment in office today, will also send out testing for trichomonas.  Patient given azithromycin and ceftriaxone in office, which he tolerated well.  Discussed safe sex practices as well as return precautions.  Patient verbalized understanding. Final Clinical Impressions(s) / UC Diagnoses   Final diagnoses:  Penile discharge     Discharge Instructions     Avoid all forms of sexual intercourse (oral, vaginal, anal) for the next 7 days to avoid spreading/reinfecting. Return if symptoms worsen/do not resolve, you develop fever, abdominal pain, blood in your urine, or are re-exposed to an STI.    ED Prescriptions    None     Controlled Substance Prescriptions [place] Controlled Substance Registry consulted? Not Applicable   Quincy Sheehan, [place] 10/23/18 1036

## 2018-10-23 NOTE — ED Triage Notes (Signed)
Pt states hes had penile discharge since this morning. No pain.

## 2018-10-27 LAB — URINE CYTOLOGY ANCILLARY ONLY
Chlamydia: POSITIVE — AB
Neisseria Gonorrhea: NEGATIVE
Trichomonas: NEGATIVE

## 2018-10-30 ENCOUNTER — Telehealth (HOSPITAL_COMMUNITY): Payer: Self-pay | Admitting: Emergency Medicine

## 2018-10-30 NOTE — Telephone Encounter (Signed)
Chlamydia is positive.  This was treated at the urgent care visit with po zithromax 1g.  Pt needs education to please refrain from sexual intercourse for 7 days to give the medicine time to work.  Sexual partners need to be notified and tested/treated.  Condoms may reduce risk of reinfection.  Recheck or followup with PCP for further evaluation if symptoms are not improving.  [facility] notified.  Attempted to reach patient. No answer at this time. Voicemail left.    

## 2018-11-03 ENCOUNTER — Telehealth (HOSPITAL_COMMUNITY): Payer: Self-pay | Admitting: Emergency Medicine

## 2018-11-03 NOTE — Telephone Encounter (Signed)
Patient contacted and made aware of all std results, all questions answered.  

## 2020-08-10 ENCOUNTER — Emergency Department (HOSPITAL_COMMUNITY): Payer: Self-pay | Admitting: Certified Registered Nurse Anesthetist

## 2020-08-10 ENCOUNTER — Encounter (HOSPITAL_COMMUNITY): Payer: Self-pay

## 2020-08-10 ENCOUNTER — Emergency Department (HOSPITAL_COMMUNITY): Payer: Self-pay

## 2020-08-10 ENCOUNTER — Inpatient Hospital Stay (HOSPITAL_COMMUNITY)
Admission: EM | Admit: 2020-08-10 | Discharge: 2020-08-31 | DRG: 957 | Disposition: A | Discharge status: Home Health | Payer: Self-pay | Attending: General Surgery | Admitting: General Surgery

## 2020-08-10 ENCOUNTER — Inpatient Hospital Stay (HOSPITAL_COMMUNITY): Payer: Self-pay

## 2020-08-10 ENCOUNTER — Encounter (HOSPITAL_COMMUNITY): Admission: EM | Disposition: A | Discharge status: Home Health | Payer: Self-pay | Source: Home / Self Care

## 2020-08-10 DIAGNOSIS — Z433 Encounter for attention to colostomy: Secondary | ICD-10-CM

## 2020-08-10 DIAGNOSIS — R091 Pleurisy: Secondary | ICD-10-CM

## 2020-08-10 DIAGNOSIS — J9690 Respiratory failure, unspecified, unspecified whether with hypoxia or hypercapnia: Secondary | ICD-10-CM

## 2020-08-10 DIAGNOSIS — T81509A Unspecified complication of foreign body accidentally left in body following unspecified procedure, initial encounter: Secondary | ICD-10-CM

## 2020-08-10 DIAGNOSIS — T1490XA Injury, unspecified, initial encounter: Secondary | ICD-10-CM

## 2020-08-10 DIAGNOSIS — W3400XA Accidental discharge from unspecified firearms or gun, initial encounter: Principal | ICD-10-CM

## 2020-08-10 DIAGNOSIS — R319 Hematuria, unspecified: Secondary | ICD-10-CM | POA: Diagnosis present

## 2020-08-10 DIAGNOSIS — Z23 Encounter for immunization: Secondary | ICD-10-CM

## 2020-08-10 DIAGNOSIS — D696 Thrombocytopenia, unspecified: Secondary | ICD-10-CM | POA: Diagnosis present

## 2020-08-10 DIAGNOSIS — R578 Other shock: Secondary | ICD-10-CM | POA: Diagnosis present

## 2020-08-10 DIAGNOSIS — D62 Acute posthemorrhagic anemia: Secondary | ICD-10-CM | POA: Diagnosis present

## 2020-08-10 DIAGNOSIS — R310 Gross hematuria: Secondary | ICD-10-CM | POA: Diagnosis not present

## 2020-08-10 DIAGNOSIS — S3661XA Primary blast injury of rectum, initial encounter: Secondary | ICD-10-CM | POA: Diagnosis present

## 2020-08-10 DIAGNOSIS — S36591A Other injury of transverse colon, initial encounter: Secondary | ICD-10-CM | POA: Diagnosis present

## 2020-08-10 DIAGNOSIS — Y249XXA Unspecified firearm discharge, undetermined intent, initial encounter: Secondary | ICD-10-CM

## 2020-08-10 DIAGNOSIS — S37011A Minor contusion of right kidney, initial encounter: Secondary | ICD-10-CM | POA: Diagnosis present

## 2020-08-10 DIAGNOSIS — S36400A Unspecified injury of duodenum, initial encounter: Secondary | ICD-10-CM | POA: Diagnosis present

## 2020-08-10 DIAGNOSIS — K567 Ileus, unspecified: Secondary | ICD-10-CM | POA: Diagnosis not present

## 2020-08-10 DIAGNOSIS — E871 Hypo-osmolality and hyponatremia: Secondary | ICD-10-CM | POA: Diagnosis not present

## 2020-08-10 DIAGNOSIS — F32A Depression, unspecified: Secondary | ICD-10-CM | POA: Diagnosis not present

## 2020-08-10 DIAGNOSIS — J9601 Acute respiratory failure with hypoxia: Secondary | ICD-10-CM | POA: Diagnosis present

## 2020-08-10 DIAGNOSIS — N179 Acute kidney failure, unspecified: Secondary | ICD-10-CM | POA: Diagnosis present

## 2020-08-10 DIAGNOSIS — S36418A Primary blast injury of other part of small intestine, initial encounter: Secondary | ICD-10-CM | POA: Diagnosis present

## 2020-08-10 DIAGNOSIS — S31640A Puncture wound with foreign body of abdominal wall, right upper quadrant with penetration into peritoneal cavity, initial encounter: Secondary | ICD-10-CM | POA: Diagnosis present

## 2020-08-10 DIAGNOSIS — K661 Hemoperitoneum: Secondary | ICD-10-CM | POA: Diagnosis present

## 2020-08-10 DIAGNOSIS — F17290 Nicotine dependence, other tobacco product, uncomplicated: Secondary | ICD-10-CM | POA: Diagnosis present

## 2020-08-10 DIAGNOSIS — S36113A Laceration of liver, unspecified degree, initial encounter: Principal | ICD-10-CM | POA: Diagnosis present

## 2020-08-10 DIAGNOSIS — S36511A Primary blast injury of transverse colon, initial encounter: Secondary | ICD-10-CM | POA: Diagnosis present

## 2020-08-10 DIAGNOSIS — Z20822 Contact with and (suspected) exposure to covid-19: Secondary | ICD-10-CM | POA: Diagnosis present

## 2020-08-10 DIAGNOSIS — S31139A Puncture wound of abdominal wall without foreign body, unspecified quadrant without penetration into peritoneal cavity, initial encounter: Secondary | ICD-10-CM

## 2020-08-10 DIAGNOSIS — E876 Hypokalemia: Secondary | ICD-10-CM | POA: Diagnosis not present

## 2020-08-10 HISTORY — PX: FOREIGN BODY REMOVAL: SHX962

## 2020-08-10 HISTORY — PX: PARTIAL COLECTOMY: SHX5273

## 2020-08-10 HISTORY — PX: LIVER REPAIR: SHX6734

## 2020-08-10 HISTORY — PX: DUODENOTOMY: SHX5801

## 2020-08-10 HISTORY — PX: GASTROSTOMY: SHX5249

## 2020-08-10 HISTORY — DX: Puncture wound of abdominal wall without foreign body, unspecified quadrant without penetration into peritoneal cavity, initial encounter: S31.139A

## 2020-08-10 HISTORY — PX: LAPAROTOMY: SHX154

## 2020-08-10 HISTORY — PX: APPLICATION OF WOUND VAC: SHX5189

## 2020-08-10 HISTORY — PX: JEJUNOSTOMY: SHX313

## 2020-08-10 LAB — CBC
HCT: 43.5 % (ref 39.0–52.0)
HCT: 31.8 % — ABNORMAL LOW (ref 39.0–52.0)
HCT: 30.2 % — ABNORMAL LOW (ref 39.0–52.0)
Hemoglobin: 13.8 g/dL (ref 13.0–17.0)
Hemoglobin: 10.8 g/dL — ABNORMAL LOW (ref 13.0–17.0)
Hemoglobin: 10.8 g/dL — ABNORMAL LOW (ref 13.0–17.0)
MCH: 28.6 pg (ref 26.0–34.0)
MCH: 29.4 pg (ref 26.0–34.0)
MCH: 30.3 pg (ref 26.0–34.0)
MCHC: 31.7 g/dL (ref 30.0–36.0)
MCHC: 34 g/dL (ref 30.0–36.0)
MCHC: 35.8 g/dL (ref 30.0–36.0)
MCV: 90.2 fL (ref 80.0–100.0)
MCV: 86.6 fL (ref 80.0–100.0)
MCV: 84.6 fL (ref 80.0–100.0)
Platelets: 204 10*3/uL (ref 150–400)
Platelets: 100 10*3/uL — ABNORMAL LOW (ref 150–400)
Platelets: 90 10*3/uL — ABNORMAL LOW (ref 150–400)
RBC: 4.82 MIL/uL (ref 4.22–5.81)
RBC: 3.67 MIL/uL — ABNORMAL LOW (ref 4.22–5.81)
RBC: 3.57 MIL/uL — ABNORMAL LOW (ref 4.22–5.81)
RDW: 14.8 % (ref 11.5–15.5)
RDW: 14.7 % (ref 11.5–15.5)
RDW: 15.4 % (ref 11.5–15.5)
WBC: 7.5 10*3/uL (ref 4.0–10.5)
WBC: 12.7 10*3/uL — ABNORMAL HIGH (ref 4.0–10.5)
WBC: 12.7 10*3/uL — ABNORMAL HIGH (ref 4.0–10.5)
nRBC: 0 % (ref 0.0–0.2)
nRBC: 0 % (ref 0.0–0.2)
nRBC: 0 % (ref 0.0–0.2)

## 2020-08-10 LAB — COMPREHENSIVE METABOLIC PANEL
ALT: 329 U/L — ABNORMAL HIGH (ref 0–44)
ALT: 347 U/L — ABNORMAL HIGH (ref 0–44)
AST: 274 U/L — ABNORMAL HIGH (ref 15–41)
AST: 323 U/L — ABNORMAL HIGH (ref 15–41)
Albumin: 3.7 g/dL (ref 3.5–5.0)
Albumin: 2.7 g/dL — ABNORMAL LOW (ref 3.5–5.0)
Alkaline Phosphatase: 34 U/L — ABNORMAL LOW (ref 38–126)
Alkaline Phosphatase: 31 U/L — ABNORMAL LOW (ref 38–126)
Anion gap: 14 (ref 5–15)
Anion gap: 7 (ref 5–15)
BUN: 10 mg/dL (ref 6–20)
BUN: 13 mg/dL (ref 6–20)
CO2: 20 mmol/L — ABNORMAL LOW (ref 22–32)
CO2: 21 mmol/L — ABNORMAL LOW (ref 22–32)
Calcium: 9 mg/dL (ref 8.9–10.3)
Calcium: 7.4 mg/dL — ABNORMAL LOW (ref 8.9–10.3)
Chloride: 102 mmol/L (ref 98–111)
Chloride: 109 mmol/L (ref 98–111)
Creatinine, Ser: 1.48 mg/dL — ABNORMAL HIGH (ref 0.61–1.24)
Creatinine, Ser: 1.83 mg/dL — ABNORMAL HIGH (ref 0.61–1.24)
GFR, Estimated: >60 mL/min (ref >60)
GFR, Estimated: 48 mL/min — ABNORMAL LOW (ref >60)
Glucose, Bld: 200 mg/dL — ABNORMAL HIGH (ref 70–99)
Glucose, Bld: 197 mg/dL — ABNORMAL HIGH (ref 70–99)
Potassium: 3.5 mmol/L (ref 3.5–5.1)
Potassium: 3.8 mmol/L (ref 3.5–5.1)
Sodium: 136 mmol/L (ref 135–145)
Sodium: 137 mmol/L (ref 135–145)
Total Bilirubin: 0.6 mg/dL (ref 0.3–1.2)
Total Bilirubin: 0.6 mg/dL (ref 0.3–1.2)
Total Protein: 6.5 g/dL (ref 6.5–8.1)
Total Protein: 4.6 g/dL — ABNORMAL LOW (ref 6.5–8.1)

## 2020-08-10 LAB — RESP PANEL BY RT-PCR (FLU A&B, COVID) ARPGX2
Influenza A by PCR: NEGATIVE
Influenza B by PCR: NEGATIVE
SARS Coronavirus 2 by RT PCR: NEGATIVE

## 2020-08-10 LAB — POCT I-STAT 7, (LYTES, BLD GAS, ICA,H+H)
Acid-Base Excess: 1 mmol/L (ref 0.0–2.0)
Bicarbonate: 25.3 mmol/L (ref 20.0–28.0)
Calcium, Ion: 1.1 mmol/L — ABNORMAL LOW (ref 1.15–1.40)
HCT: 31 % — ABNORMAL LOW (ref 39.0–52.0)
Hemoglobin: 10.5 g/dL — ABNORMAL LOW (ref 13.0–17.0)
O2 Saturation: 100 %
Patient temperature: 91
Potassium: 3.9 mmol/L (ref 3.5–5.1)
Sodium: 138 mmol/L (ref 135–145)
TCO2: 26 mmol/L (ref 22–32)
pCO2 arterial: 32.9 mmHg (ref 32.0–48.0)
pH, Arterial: 7.476 — ABNORMAL HIGH (ref 7.350–7.450)
pO2, Arterial: 526 mmHg — ABNORMAL HIGH (ref 83.0–108.0)

## 2020-08-10 LAB — TRAUMA TEG PANEL
CFF Max Amplitude: 12.7 mm — ABNORMAL LOW (ref 15–32)
Citrated Kaolin (R): 4.7 min (ref 4.6–9.1)
Citrated Rapid TEG (MA): 45 mm — ABNORMAL LOW (ref 52–70)
Lysis at 30 Minutes: 0.9 % (ref 0.0–2.6)

## 2020-08-10 LAB — BLOOD PRODUCT ORDER (VERBAL) VERIFICATION

## 2020-08-10 LAB — I-STAT CHEM 8, ED
BUN: 12 mg/dL (ref 6–20)
Calcium, Ion: 1.16 mmol/L (ref 1.15–1.40)
Chloride: 101 mmol/L (ref 98–111)
Creatinine, Ser: 1.4 mg/dL — ABNORMAL HIGH (ref 0.61–1.24)
Glucose, Bld: 198 mg/dL — ABNORMAL HIGH (ref 70–99)
HCT: 43 % (ref 39.0–52.0)
Hemoglobin: 14.6 g/dL (ref 13.0–17.0)
Potassium: 3.4 mmol/L — ABNORMAL LOW (ref 3.5–5.1)
Sodium: 138 mmol/L (ref 135–145)
TCO2: 25 mmol/L (ref 22–32)

## 2020-08-10 LAB — ETHANOL: Alcohol, Ethyl (B): <10 mg/dL (ref <10)

## 2020-08-10 LAB — LACTIC ACID, PLASMA: Lactic Acid, Venous: 5.8 mmol/L (ref 0.5–1.9)

## 2020-08-10 LAB — PROTIME-INR
INR: 1.1 (ref 0.8–1.2)
Prothrombin Time: 14.2 seconds (ref 11.4–15.2)

## 2020-08-10 LAB — HIV ANTIBODY (ROUTINE TESTING W REFLEX): HIV Screen 4th Generation wRfx: NONREACTIVE

## 2020-08-10 LAB — ABO/RH: ABO/RH(D): A POS

## 2020-08-10 LAB — PREPARE RBC (CROSSMATCH)

## 2020-08-10 LAB — MRSA PCR SCREENING: MRSA by PCR: NEGATIVE

## 2020-08-10 SURGERY — LAPAROTOMY, EXPLORATORY
Anesthesia: General | Site: Abdomen

## 2020-08-10 MED ORDER — PHENYLEPHRINE HCL-NACL 10-0.9 MG/250ML-% IV SOLN: 0.0000 ug/min | INTRAVENOUS | Status: DC

## 2020-08-10 MED ORDER — PANTOPRAZOLE SODIUM 40 MG PO TBEC
40.0000 mg | DELAYED_RELEASE_TABLET | Freq: Every day | ORAL | Status: DC
  Administered 2020-08-18 – 2020-08-31 (×11): 40 mg via ORAL
  Filled 2020-08-10 (×11): qty 1

## 2020-08-10 MED ORDER — ROCURONIUM BROMIDE 10 MG/ML (PF) SYRINGE
PREFILLED_SYRINGE | INTRAVENOUS | Status: AC
  Filled 2020-08-10: qty 10

## 2020-08-10 MED ORDER — ALBUMIN HUMAN 5 % IV SOLN
INTRAVENOUS | Status: AC
  Administered 2020-08-10: 12.5 g
  Filled 2020-08-10: qty 500

## 2020-08-10 MED ORDER — LACTATED RINGERS IV SOLN: INTRAVENOUS | Status: DC

## 2020-08-10 MED ORDER — PHENYLEPHRINE HCL-NACL 10-0.9 MG/250ML-% IV SOLN
INTRAVENOUS | Status: DC | PRN
  Administered 2020-08-10: 25 ug/min via INTRAVENOUS

## 2020-08-10 MED ORDER — PROPOFOL 10 MG/ML IV BOLUS
INTRAVENOUS | Status: AC
  Filled 2020-08-10: qty 40

## 2020-08-10 MED ORDER — SODIUM CHLORIDE 0.9% IV SOLUTION: Freq: Once | INTRAVENOUS | Status: AC

## 2020-08-10 MED ORDER — PHENYLEPHRINE 40 MCG/ML (10ML) SYRINGE FOR IV PUSH (FOR BLOOD PRESSURE SUPPORT)
PREFILLED_SYRINGE | INTRAVENOUS | Status: AC
  Filled 2020-08-10: qty 10

## 2020-08-10 MED ORDER — MIDAZOLAM HCL 2 MG/2ML IJ SOLN
4.0000 mg | Freq: Once | INTRAMUSCULAR | Status: AC
  Administered 2020-08-10: 4 mg via INTRAVENOUS
  Filled 2020-08-10: qty 4

## 2020-08-10 MED ORDER — ROCURONIUM BROMIDE 10 MG/ML (PF) SYRINGE
PREFILLED_SYRINGE | INTRAVENOUS | Status: DC | PRN
  Administered 2020-08-10: 100 mg via INTRAVENOUS
  Administered 2020-08-10: 50 mg via INTRAVENOUS

## 2020-08-10 MED ORDER — PHENYLEPHRINE CONCENTRATED 100MG/250ML (0.4 MG/ML) INFUSION SIMPLE
0.0000 ug/min | INTRAVENOUS | Status: DC
  Administered 2020-08-10: 250 ug/min via INTRAVENOUS
  Administered 2020-08-10: 150 ug/min via INTRAVENOUS
  Administered 2020-08-11: 110 ug/min via INTRAVENOUS
  Filled 2020-08-10 (×4): qty 250

## 2020-08-10 MED ORDER — PROPOFOL 500 MG/50ML IV EMUL
INTRAVENOUS | Status: DC | PRN
  Administered 2020-08-10: 50 ug/kg/min via INTRAVENOUS

## 2020-08-10 MED ORDER — CHLORHEXIDINE GLUCONATE CLOTH 2 % EX PADS
6.0000 | MEDICATED_PAD | Freq: Every day | CUTANEOUS | Status: DC
  Administered 2020-08-10 – 2020-08-31 (×21): 6 via TOPICAL

## 2020-08-10 MED ORDER — FENTANYL 2500MCG IN NS 250ML (10MCG/ML) PREMIX INFUSION
0.0000 ug/h | INTRAVENOUS | Status: DC
  Administered 2020-08-10: 50 ug/h via INTRAVENOUS
  Administered 2020-08-11: 200 ug/h via INTRAVENOUS
  Administered 2020-08-11 – 2020-08-12 (×2): 225 ug/h via INTRAVENOUS
  Filled 2020-08-10 (×4): qty 250

## 2020-08-10 MED ORDER — LACTATED RINGERS IV SOLN: INTRAVENOUS | Status: DC | PRN

## 2020-08-10 MED ORDER — DEXMEDETOMIDINE HCL IN NACL 400 MCG/100ML IV SOLN
0.4000 ug/kg/h | INTRAVENOUS | Status: DC
  Administered 2020-08-10: 0.6 ug/kg/h via INTRAVENOUS
  Administered 2020-08-11 (×2): 1.2 ug/kg/h via INTRAVENOUS
  Administered 2020-08-11: 1 ug/kg/h via INTRAVENOUS
  Administered 2020-08-11 (×3): 1.2 ug/kg/h via INTRAVENOUS
  Administered 2020-08-12: 0.6 ug/kg/h via INTRAVENOUS
  Administered 2020-08-12: 1.2 ug/kg/h via INTRAVENOUS
  Administered 2020-08-12: 1 ug/kg/h via INTRAVENOUS
  Administered 2020-08-12: 0.7 ug/kg/h via INTRAVENOUS
  Filled 2020-08-10 (×2): qty 100
  Filled 2020-08-10 (×2): qty 200
  Filled 2020-08-10 (×7): qty 100

## 2020-08-10 MED ORDER — SODIUM CHLORIDE 0.9 % IV SOLN: INTRAVENOUS | Status: DC | PRN

## 2020-08-10 MED ORDER — 0.9 % SODIUM CHLORIDE (POUR BTL) OPTIME
TOPICAL | Status: DC | PRN
Start: 2020-08-10 — End: 2020-08-10
  Administered 2020-08-10: 2000 mL

## 2020-08-10 MED ORDER — FENTANYL CITRATE (PF) 100 MCG/2ML IJ SOLN: INTRAMUSCULAR | Status: DC | PRN

## 2020-08-10 MED ORDER — ETOMIDATE 2 MG/ML IV SOLN
30.0000 mg | Freq: Once | INTRAVENOUS | Status: AC
  Administered 2020-08-10: 30 mg via INTRAVENOUS
  Filled 2020-08-10: qty 15

## 2020-08-10 MED ORDER — ONDANSETRON 4 MG PO TBDP: 4.0000 mg | ORAL_TABLET | Freq: Four times a day (QID) | ORAL | Status: DC | PRN

## 2020-08-10 MED ORDER — SUCCINYLCHOLINE CHLORIDE 20 MG/ML IJ SOLN
100.0000 mg | Freq: Once | INTRAMUSCULAR | Status: AC
  Administered 2020-08-10: 100 mg via INTRAVENOUS
  Filled 2020-08-10: qty 5

## 2020-08-10 MED ORDER — FENTANYL CITRATE (PF) 250 MCG/5ML IJ SOLN
INTRAMUSCULAR | Status: DC | PRN
  Administered 2020-08-10 (×2): 50 ug via INTRAVENOUS

## 2020-08-10 MED ORDER — PHENYLEPHRINE HCL-NACL 10-0.9 MG/250ML-% IV SOLN
INTRAVENOUS | Status: AC
  Administered 2020-08-10: 10 mg
  Filled 2020-08-10: qty 250

## 2020-08-10 MED ORDER — HEMOSTATIC AGENTS (NO CHARGE) OPTIME
TOPICAL | Status: DC | PRN
  Administered 2020-08-10 (×3): 1 via TOPICAL

## 2020-08-10 MED ORDER — CHLORHEXIDINE GLUCONATE 0.12% ORAL RINSE (MEDLINE KIT)
15.0000 mL | Freq: Two times a day (BID) | OROMUCOSAL | Status: DC
  Administered 2020-08-10 – 2020-08-12 (×5): 15 mL via OROMUCOSAL

## 2020-08-10 MED ORDER — ALBUMIN HUMAN 5 % IV SOLN
25.0000 g | Freq: Once | INTRAVENOUS | Status: AC
  Administered 2020-08-11: 25 g via INTRAVENOUS
  Filled 2020-08-10: qty 500

## 2020-08-10 MED ORDER — PANTOPRAZOLE SODIUM 40 MG IV SOLR
40.0000 mg | Freq: Every day | INTRAVENOUS | Status: DC
  Administered 2020-08-11 – 2020-08-22 (×10): 40 mg via INTRAVENOUS
  Filled 2020-08-10 (×10): qty 40

## 2020-08-10 MED ORDER — FENTANYL CITRATE (PF) 100 MCG/2ML IJ SOLN
50.0000 ug | Freq: Once | INTRAMUSCULAR | Status: AC
  Administered 2020-08-10: 50 ug via INTRAVENOUS

## 2020-08-10 MED ORDER — EPINEPHRINE 1 MG/10ML IJ SOSY
PREFILLED_SYRINGE | INTRAMUSCULAR | Status: AC
  Filled 2020-08-10: qty 10

## 2020-08-10 MED ORDER — PROPOFOL 1000 MG/100ML IV EMUL
0.0000 ug/kg/min | INTRAVENOUS | Status: DC
  Administered 2020-08-10: 40 ug/kg/min via INTRAVENOUS
  Administered 2020-08-10: 50 ug/kg/min via INTRAVENOUS
  Administered 2020-08-11 (×2): 10 ug/kg/min via INTRAVENOUS
  Administered 2020-08-12: 20 ug/kg/min via INTRAVENOUS
  Filled 2020-08-10 (×5): qty 100

## 2020-08-10 MED ORDER — FENTANYL CITRATE (PF) 100 MCG/2ML IJ SOLN: 50.0000 ug | INTRAMUSCULAR | Status: DC | PRN

## 2020-08-10 MED ORDER — METOPROLOL TARTRATE 5 MG/5ML IV SOLN: 5.0000 mg | Freq: Four times a day (QID) | INTRAVENOUS | Status: DC | PRN

## 2020-08-10 MED ORDER — ROCURONIUM BROMIDE 100 MG/10ML IV SOLN: INTRAVENOUS | Status: DC | PRN

## 2020-08-10 MED ORDER — FENTANYL BOLUS VIA INFUSION
50.0000 ug | INTRAVENOUS | Status: DC | PRN
  Filled 2020-08-10: qty 100

## 2020-08-10 MED ORDER — FENTANYL CITRATE (PF) 250 MCG/5ML IJ SOLN
INTRAMUSCULAR | Status: AC
  Filled 2020-08-10: qty 5

## 2020-08-10 MED ORDER — IOHEXOL 300 MG/ML  SOLN
80.0000 mL | Freq: Once | INTRAMUSCULAR | Status: AC | PRN
  Administered 2020-08-10: 80 mL via INTRAVENOUS

## 2020-08-10 MED ORDER — ONDANSETRON HCL 4 MG/2ML IJ SOLN
4.0000 mg | Freq: Four times a day (QID) | INTRAMUSCULAR | Status: DC | PRN
  Administered 2020-08-12 (×2): 4 mg via INTRAVENOUS
  Filled 2020-08-10 (×2): qty 2

## 2020-08-10 MED ORDER — MIDAZOLAM HCL 2 MG/2ML IJ SOLN
INTRAMUSCULAR | Status: AC
  Filled 2020-08-10: qty 2

## 2020-08-10 MED ORDER — MIDAZOLAM HCL 5 MG/5ML IJ SOLN
INTRAMUSCULAR | Status: DC | PRN
  Administered 2020-08-10: 2 mg via INTRAVENOUS

## 2020-08-10 MED ORDER — CEFAZOLIN SODIUM-DEXTROSE 2-4 GM/100ML-% IV SOLN
2.0000 g | Freq: Once | INTRAVENOUS | Status: AC
  Administered 2020-08-10: 2 g via INTRAVENOUS
  Filled 2020-08-10: qty 100

## 2020-08-10 MED ORDER — ORAL CARE MOUTH RINSE
15.0000 mL | OROMUCOSAL | Status: DC
  Administered 2020-08-10 – 2020-08-12 (×18): 15 mL via OROMUCOSAL

## 2020-08-10 MED ORDER — ALBUMIN HUMAN 5 % IV SOLN
25.0000 g | Freq: Once | INTRAVENOUS | Status: AC
  Administered 2020-08-10: 25 g via INTRAVENOUS

## 2020-08-10 MED ORDER — ALBUMIN HUMAN 5 % IV SOLN
25.0000 g | Freq: Once | INTRAVENOUS | Status: AC
  Administered 2020-08-10: 25 g via INTRAVENOUS
  Filled 2020-08-10: qty 500

## 2020-08-10 SURGICAL SUPPLY — 62 items
APL PRP STRL LF DISP 70% ISPRP (MISCELLANEOUS) ×2
BLADE CLIPPER SURG (BLADE) IMPLANT
CANISTER SUCT 3000ML PPV (MISCELLANEOUS) ×3 IMPLANT
CATH MALECOT BARD  24FR (CATHETERS) ×3
CATH MALECOT BARD 24FR (CATHETERS) IMPLANT
CHLORAPREP W/TINT 26 (MISCELLANEOUS) ×3 IMPLANT
COVER SURGICAL LIGHT HANDLE (MISCELLANEOUS) ×3 IMPLANT
COVER WAND RF STERILE (DRAPES) ×3 IMPLANT
DRAPE LAPAROSCOPIC ABDOMINAL (DRAPES) ×3 IMPLANT
DRAPE WARM FLUID 44X44 (DRAPES) ×3 IMPLANT
DRSG OPSITE POSTOP 4X10 (GAUZE/BANDAGES/DRESSINGS) IMPLANT
DRSG OPSITE POSTOP 4X8 (GAUZE/BANDAGES/DRESSINGS) IMPLANT
DRSG PAD ABDOMINAL 8X10 ST (GAUZE/BANDAGES/DRESSINGS) ×1 IMPLANT
ELECT BLADE 6.5 EXT (BLADE) IMPLANT
ELECT CAUTERY BLADE 6.4 (BLADE) ×3 IMPLANT
ELECT REM PT RETURN 9FT ADLT (ELECTROSURGICAL) ×3
ELECTRODE REM PT RTRN 9FT ADLT (ELECTROSURGICAL) ×2 IMPLANT
GAUZE SPONGE 4X4 12PLY STRL (GAUZE/BANDAGES/DRESSINGS) ×2 IMPLANT
GLOVE BIO SURGEON STRL SZ8 (GLOVE) ×3 IMPLANT
GLOVE SRG 8 PF TXTR STRL LF DI (GLOVE) ×2 IMPLANT
GLOVE SURG UNDER POLY LF SZ8 (GLOVE) ×3
GOWN STRL REUS W/ TWL LRG LVL3 (GOWN DISPOSABLE) ×2 IMPLANT
GOWN STRL REUS W/ TWL XL LVL3 (GOWN DISPOSABLE) ×2 IMPLANT
GOWN STRL REUS W/TWL LRG LVL3 (GOWN DISPOSABLE) ×15
GOWN STRL REUS W/TWL XL LVL3 (GOWN DISPOSABLE) ×3
HANDLE SUCTION POOLE (INSTRUMENTS) ×2 IMPLANT
HEMOSTAT SNOW SURGICEL 2X4 (HEMOSTASIS) ×3 IMPLANT
KIT BASIN OR (CUSTOM PROCEDURE TRAY) ×3 IMPLANT
KIT TURNOVER KIT B (KITS) ×3 IMPLANT
LIGASURE IMPACT 36 18CM CVD LR (INSTRUMENTS) ×1 IMPLANT
NS IRRIG 1000ML POUR BTL (IV SOLUTION) ×6 IMPLANT
PACK GENERAL/GYN (CUSTOM PROCEDURE TRAY) ×3 IMPLANT
PAD ARMBOARD 7.5X6 YLW CONV (MISCELLANEOUS) ×3 IMPLANT
PENCIL SMOKE EVACUATOR (MISCELLANEOUS) ×3 IMPLANT
RELOAD PROXIMATE 75MM BLUE (ENDOMECHANICALS) ×6 IMPLANT
RELOAD PROXIMATE TA60MM BLUE (ENDOMECHANICALS) ×3 IMPLANT
RELOAD STAPLE 60 BLU REG PROX (ENDOMECHANICALS) IMPLANT
RELOAD STAPLE 75 3.8 BLU REG (ENDOMECHANICALS) IMPLANT
SPECIMEN JAR LARGE (MISCELLANEOUS) IMPLANT
SPONGE ABD ABTHERA ADVANCE (MISCELLANEOUS) ×1 IMPLANT
SPONGE DRAIN TRACH 4X4 STRL 2S (GAUZE/BANDAGES/DRESSINGS) ×1 IMPLANT
SPONGE LAP 18X18 RF (DISPOSABLE) ×14 IMPLANT
STAPLER GUN LINEAR PROX 60 (STAPLE) ×1 IMPLANT
STAPLER PROXIMATE 75MM BLUE (STAPLE) ×1 IMPLANT
STAPLER VISISTAT (STAPLE) ×1 IMPLANT
STAPLER VISISTAT 35W (STAPLE) ×3 IMPLANT
SUCTION POOLE HANDLE (INSTRUMENTS) ×6
SUT ETHILON 2 0 FS 18 (SUTURE) ×4 IMPLANT
SUT PDS AB 1 TP1 96 (SUTURE) ×6 IMPLANT
SUT SILK 2 0 SH CR/8 (SUTURE) ×3 IMPLANT
SUT SILK 2 0 TIES 10X30 (SUTURE) ×5 IMPLANT
SUT SILK 2 0SH CR/8 30 (SUTURE) ×2 IMPLANT
SUT SILK 3 0 SH CR/8 (SUTURE) ×3 IMPLANT
SUT SILK 3 0 TIES 10X30 (SUTURE) ×4 IMPLANT
SUT SILK 3 0SH CR/8 30 (SUTURE) ×1 IMPLANT
SYR HYPO PISTON GT 60 (SYRINGE) ×1 IMPLANT
TAPE CLOTH SOFT 2X10 (GAUZE/BANDAGES/DRESSINGS) ×1 IMPLANT
TAPE CLOTH SURG 4X10 WHT LF (GAUZE/BANDAGES/DRESSINGS) ×1 IMPLANT
TOWEL GREEN STERILE (TOWEL DISPOSABLE) ×3 IMPLANT
TRAY FOLEY MTR SLVR 16FR STAT (SET/KITS/TRAYS/PACK) IMPLANT
TUBE GASTROSTOMY 18F (CATHETERS) ×2 IMPLANT
YANKAUER SUCT BULB TIP NO VENT (SUCTIONS) IMPLANT

## 2020-08-10 NOTE — ED Notes (Signed)
NT was unsuccessful with manual bp

## 2020-08-10 NOTE — Anesthesia Procedure Notes (Signed)
Arterial Line Insertion Start/End4/21/2022 8:54 AM, 08/10/2020 8:54 AM Performed by: Achille Rich, MD, anesthesiologist  Patient location: Pre-op. Preanesthetic checklist: patient identified, IV checked, site marked, risks and benefits discussed, surgical consent, monitors and equipment checked, pre-op evaluation, timeout performed and anesthesia consent Lidocaine 1% used for infiltration Left, radial was placed Catheter size: 20 Fr Hand hygiene performed  and maximum sterile barriers used   Attempts: 1 Procedure performed using ultrasound guided technique. Ultrasound Notes:anatomy identified, needle tip was noted to be adjacent to the nerve/plexus identified and no ultrasound evidence of intravascular and/or intraneural injection Following insertion, dressing applied. Post procedure assessment: normal and unchanged  Patient tolerated the procedure well with no immediate complications.

## 2020-08-10 NOTE — Progress Notes (Signed)
Patient ID: Alejandro Shelton, male   DOB: June 16, 1985, 35 y.o.   MRN: [id number] I updated his mother and father at the bedside.  Violeta Gelinas, MD, MPH, FACS Please use [email or web address] to contact on call provider

## 2020-08-10 NOTE — ED Notes (Signed)
Pt transported to OR
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
## 2020-08-10 NOTE — Progress Notes (Signed)
Patient received 2 units PRBC and 1 Platelet. Patient received  blood products due to an emergent  need. Blood was checked and verified by 2 RNs. Total volumes have been charted in the I/Os.
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
## 2020-08-10 NOTE — Progress Notes (Signed)
Patient intubated by ED MD without complications.  Patient then bagged to OR without complications.
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
## 2020-08-10 NOTE — Progress Notes (Signed)
Patient ID: Alejandro Shelton, male   DOB: 18-Jul-1985, 35 y.o.   MRN: [id number] CT C/A/P reviewed with radiologist and IR. No active extrav from R kidney nor from the liver. Violeta Gelinas, MD, MPH, FACS Please use [email or web address] to contact on call provider
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
## 2020-08-10 NOTE — TOC CAGE-AID Note (Signed)
Transition of Care [facility]) - CAGE-AID Screening   Patient Details  Name: Alejandro Shelton MRN: [id number] Date of Birth: [birth year]  Transition of Care [facility]) CM/SW Contact:    Janora Norlander, RN Phone Number: [phone] 08/10/2020, 7:57 PM   Clinical Narrative: Pt intubated and sedated -- unable to participate in screening.   CAGE-AID Screening: Substance Abuse Screening unable to be completed due to: : Patient unable to participate             Substance Abuse Education Offered: No
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
## 2020-08-10 NOTE — Progress Notes (Signed)
Patient arrived to unit

## 2020-08-10 NOTE — OR Nursing (Signed)
Critical lab - Lactic Acid ( 5.8) reported to Dr. Janee Morn while in OR suite.

## 2020-08-10 NOTE — Progress Notes (Signed)
Transported pt to CT scan and back without complication. Rt will monitor.
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
## 2020-08-11 ENCOUNTER — Inpatient Hospital Stay (HOSPITAL_COMMUNITY): Payer: Self-pay

## 2020-08-11 ENCOUNTER — Inpatient Hospital Stay (HOSPITAL_COMMUNITY): Payer: Self-pay | Admitting: Certified Registered Nurse Anesthetist

## 2020-08-11 ENCOUNTER — Encounter (HOSPITAL_COMMUNITY): Admission: EM | Disposition: A | Discharge status: Home Health | Payer: Self-pay | Source: Home / Self Care

## 2020-08-11 ENCOUNTER — Encounter (HOSPITAL_COMMUNITY): Payer: Self-pay | Admitting: General Surgery

## 2020-08-11 HISTORY — PX: COLOSTOMY: SHX63

## 2020-08-11 HISTORY — PX: LAPAROTOMY: SHX154

## 2020-08-11 LAB — CBC
HCT: 34.2 % — ABNORMAL LOW (ref 39.0–52.0)
Hemoglobin: 11.8 g/dL — ABNORMAL LOW (ref 13.0–17.0)
MCH: 28.8 pg (ref 26.0–34.0)
MCHC: 34.5 g/dL (ref 30.0–36.0)
MCV: 83.4 fL (ref 80.0–100.0)
Platelets: 87 10*3/uL — ABNORMAL LOW (ref 150–400)
RBC: 4.1 MIL/uL — ABNORMAL LOW (ref 4.22–5.81)
RDW: 15 % (ref 11.5–15.5)
WBC: 16.9 10*3/uL — ABNORMAL HIGH (ref 4.0–10.5)
nRBC: 0 % (ref 0.0–0.2)

## 2020-08-11 LAB — POCT I-STAT 7, (LYTES, BLD GAS, ICA,H+H)
Acid-Base Excess: 0 mmol/L (ref 0.0–2.0)
Acid-Base Excess: 1 mmol/L (ref 0.0–2.0)
Bicarbonate: 25.9 mmol/L (ref 20.0–28.0)
Bicarbonate: 24.4 mmol/L (ref 20.0–28.0)
Calcium, Ion: 1.08 mmol/L — ABNORMAL LOW (ref 1.15–1.40)
Calcium, Ion: 1.02 mmol/L — ABNORMAL LOW (ref 1.15–1.40)
HCT: 31 % — ABNORMAL LOW (ref 39.0–52.0)
HCT: 33 % — ABNORMAL LOW (ref 39.0–52.0)
Hemoglobin: 10.5 g/dL — ABNORMAL LOW (ref 13.0–17.0)
Hemoglobin: 11.2 g/dL — ABNORMAL LOW (ref 13.0–17.0)
O2 Saturation: 100 %
O2 Saturation: 100 %
Patient temperature: 35
Patient temperature: 37.3
Potassium: 3.9 mmol/L (ref 3.5–5.1)
Potassium: 4.8 mmol/L (ref 3.5–5.1)
Sodium: 139 mmol/L (ref 135–145)
Sodium: 138 mmol/L (ref 135–145)
TCO2: 27 mmol/L (ref 22–32)
TCO2: 25 mmol/L (ref 22–32)
pCO2 arterial: 42.9 mmHg (ref 32.0–48.0)
pCO2 arterial: 35 mmHg (ref 32.0–48.0)
pH, Arterial: 7.38 (ref 7.350–7.450)
pH, Arterial: 7.452 — ABNORMAL HIGH (ref 7.350–7.450)
pO2, Arterial: 281 mmHg — ABNORMAL HIGH (ref 83.0–108.0)
pO2, Arterial: 164 mmHg — ABNORMAL HIGH (ref 83.0–108.0)

## 2020-08-11 LAB — PHOSPHORUS: Phosphorus: 2.8 mg/dL (ref 2.5–4.6)

## 2020-08-11 LAB — PREPARE FRESH FROZEN PLASMA
Unit division: 0
Unit division: 0
Unit division: 0
Unit division: 0

## 2020-08-11 LAB — COMPREHENSIVE METABOLIC PANEL
ALT: 637 U/L — ABNORMAL HIGH (ref 0–44)
AST: 606 U/L — ABNORMAL HIGH (ref 15–41)
Albumin: 3.3 g/dL — ABNORMAL LOW (ref 3.5–5.0)
Alkaline Phosphatase: 29 U/L — ABNORMAL LOW (ref 38–126)
Anion gap: 9 (ref 5–15)
BUN: 20 mg/dL (ref 6–20)
CO2: 21 mmol/L — ABNORMAL LOW (ref 22–32)
Calcium: 7.5 mg/dL — ABNORMAL LOW (ref 8.9–10.3)
Chloride: 106 mmol/L (ref 98–111)
Creatinine, Ser: 2.16 mg/dL — ABNORMAL HIGH (ref 0.61–1.24)
GFR, Estimated: 39 mL/min — ABNORMAL LOW (ref >60)
Glucose, Bld: 115 mg/dL — ABNORMAL HIGH (ref 70–99)
Potassium: 4.6 mmol/L (ref 3.5–5.1)
Sodium: 136 mmol/L (ref 135–145)
Total Bilirubin: 0.7 mg/dL (ref 0.3–1.2)
Total Protein: 5.1 g/dL — ABNORMAL LOW (ref 6.5–8.1)

## 2020-08-11 LAB — PROTIME-INR
INR: 1.2 (ref 0.8–1.2)
Prothrombin Time: 15.3 seconds — ABNORMAL HIGH (ref 11.4–15.2)

## 2020-08-11 LAB — BPAM FFP
Blood Product Expiration Date: 202204212359
Blood Product Expiration Date: 202204212359
Blood Product Expiration Date: 202204272359
Blood Product Expiration Date: 202204272359
Blood Product Expiration Date: 202204292359
Blood Product Expiration Date: 202204302359
ISSUE DATE / TIME: 202204210854
ISSUE DATE / TIME: 202204210854
ISSUE DATE / TIME: 202204210906
ISSUE DATE / TIME: 202204210906
ISSUE DATE / TIME: 202204210906
ISSUE DATE / TIME: 202204211236
Unit Type and Rh: 6200
Unit Type and Rh: 6200
Unit Type and Rh: 6200
Unit Type and Rh: 6200
Unit Type and Rh: 6200
Unit Type and Rh: 6200

## 2020-08-11 LAB — PREPARE RBC (CROSSMATCH)

## 2020-08-11 LAB — TRIGLYCERIDES: Triglycerides: 90 mg/dL (ref <150)

## 2020-08-11 LAB — GLUCOSE, CAPILLARY
Glucose-Capillary: 137 mg/dL — ABNORMAL HIGH (ref 70–99)
Glucose-Capillary: 127 mg/dL — ABNORMAL HIGH (ref 70–99)
Glucose-Capillary: 124 mg/dL — ABNORMAL HIGH (ref 70–99)

## 2020-08-11 LAB — PREPARE PLATELET PHERESIS: Unit division: 0

## 2020-08-11 LAB — BPAM PLATELET PHERESIS
Blood Product Expiration Date: 202204222359
ISSUE DATE / TIME: 202204211620
Unit Type and Rh: 5100

## 2020-08-11 LAB — SURGICAL PCR SCREEN
MRSA, PCR: NEGATIVE
Staphylococcus aureus: NEGATIVE

## 2020-08-11 LAB — MAGNESIUM: Magnesium: 1.4 mg/dL — ABNORMAL LOW (ref 1.7–2.4)

## 2020-08-11 SURGERY — LAPAROTOMY, EXPLORATORY
Anesthesia: General | Site: Abdomen | Laterality: Right

## 2020-08-11 MED ORDER — VITAL HIGH PROTEIN PO LIQD: 1000.0000 mL | ORAL | Status: DC

## 2020-08-11 MED ORDER — FENTANYL CITRATE (PF) 250 MCG/5ML IJ SOLN
INTRAMUSCULAR | Status: DC | PRN
  Administered 2020-08-11: 50 ug via INTRAVENOUS

## 2020-08-11 MED ORDER — MIDAZOLAM HCL 2 MG/2ML IJ SOLN
INTRAMUSCULAR | Status: AC
  Filled 2020-08-11: qty 2

## 2020-08-11 MED ORDER — PROPOFOL 10 MG/ML IV BOLUS
INTRAVENOUS | Status: AC
  Filled 2020-08-11: qty 20

## 2020-08-11 MED ORDER — MAGNESIUM SULFATE 4 GM/100ML IV SOLN
4.0000 g | Freq: Once | INTRAVENOUS | Status: AC
  Administered 2020-08-11: 4 g via INTRAVENOUS
  Filled 2020-08-11: qty 100

## 2020-08-11 MED ORDER — PIPERACILLIN-TAZOBACTAM 3.375 G IVPB 30 MIN: 3.3750 g | Freq: Three times a day (TID) | INTRAVENOUS | Status: DC

## 2020-08-11 MED ORDER — LACTATED RINGERS IV BOLUS
1000.0000 mL | Freq: Once | INTRAVENOUS | Status: AC
  Administered 2020-08-11: 1000 mL via INTRAVENOUS

## 2020-08-11 MED ORDER — FENTANYL CITRATE (PF) 250 MCG/5ML IJ SOLN
INTRAMUSCULAR | Status: AC
  Filled 2020-08-11: qty 5

## 2020-08-11 MED ORDER — NOREPINEPHRINE 4 MG/250ML-% IV SOLN
0.0000 ug/min | INTRAVENOUS | Status: DC
  Administered 2020-08-11: 6 ug/min via INTRAVENOUS
  Administered 2020-08-11: 2 ug/min via INTRAVENOUS
  Filled 2020-08-11: qty 250

## 2020-08-11 MED ORDER — NOREPINEPHRINE 4 MG/250ML-% IV SOLN
0.0000 ug/min | INTRAVENOUS | Status: DC
Start: 2020-08-11 — End: 2020-08-11
  Filled 2020-08-11: qty 250

## 2020-08-11 MED ORDER — SODIUM PHOSPHATES 45 MMOLE/15ML IV SOLN
20.0000 mmol | Freq: Once | INTRAVENOUS | Status: AC
  Administered 2020-08-11: 20 mmol via INTRAVENOUS
  Filled 2020-08-11: qty 6.67

## 2020-08-11 MED ORDER — PROSOURCE TF PO LIQD
45.0000 mL | Freq: Two times a day (BID) | ORAL | Status: DC
  Administered 2020-08-11 – 2020-08-12 (×2): 45 mL
  Filled 2020-08-11: qty 45

## 2020-08-11 MED ORDER — LACTATED RINGERS IV SOLN: INTRAVENOUS | Status: DC | PRN

## 2020-08-11 MED ORDER — OXYCODONE HCL 5 MG/5ML PO SOLN
5.0000 mg | ORAL | Status: DC | PRN
  Administered 2020-08-11 – 2020-08-12 (×3): 10 mg
  Filled 2020-08-11 (×3): qty 10

## 2020-08-11 MED ORDER — PIVOT 1.5 CAL PO LIQD
1000.0000 mL | ORAL | Status: DC
  Administered 2020-08-11: 1000 mL

## 2020-08-11 MED ORDER — MIDAZOLAM HCL 5 MG/5ML IJ SOLN
INTRAMUSCULAR | Status: DC | PRN
  Administered 2020-08-11: 2 mg via INTRAVENOUS

## 2020-08-11 MED ORDER — 0.9 % SODIUM CHLORIDE (POUR BTL) OPTIME
TOPICAL | Status: DC | PRN
  Administered 2020-08-11 (×2): 1000 mL

## 2020-08-11 MED ORDER — ACETAMINOPHEN 10 MG/ML IV SOLN
1000.0000 mg | Freq: Four times a day (QID) | INTRAVENOUS | Status: AC
  Administered 2020-08-11 – 2020-08-12 (×4): 1000 mg via INTRAVENOUS
  Filled 2020-08-11 (×4): qty 100

## 2020-08-11 MED ORDER — PIPERACILLIN-TAZOBACTAM 3.375 G IVPB
3.3750 g | Freq: Three times a day (TID) | INTRAVENOUS | Status: AC
  Administered 2020-08-11 – 2020-08-15 (×12): 3.375 g via INTRAVENOUS
  Filled 2020-08-11 (×12): qty 50

## 2020-08-11 MED ORDER — ROCURONIUM BROMIDE 10 MG/ML (PF) SYRINGE
PREFILLED_SYRINGE | INTRAVENOUS | Status: AC
  Filled 2020-08-11: qty 30

## 2020-08-11 MED ORDER — ROCURONIUM BROMIDE 100 MG/10ML IV SOLN
INTRAVENOUS | Status: DC | PRN
  Administered 2020-08-11: 50 mg via INTRAVENOUS
  Administered 2020-08-11: 40 mg via INTRAVENOUS
  Administered 2020-08-11: 60 mg via INTRAVENOUS

## 2020-08-11 MED ORDER — ALBUMIN HUMAN 5 % IV SOLN: INTRAVENOUS | Status: DC | PRN

## 2020-08-11 SURGICAL SUPPLY — 42 items
BNDG GAUZE ELAST 4 BULKY (GAUZE/BANDAGES/DRESSINGS) ×1 IMPLANT
CANISTER SUCT 3000ML PPV (MISCELLANEOUS) ×3 IMPLANT
CANISTER WOUNDNEG PRESSURE 500 (CANNISTER) ×1 IMPLANT
COVER SURGICAL LIGHT HANDLE (MISCELLANEOUS) ×3 IMPLANT
DRAIN CHANNEL 19F RND (DRAIN) ×2 IMPLANT
DRAPE LAPAROSCOPIC ABDOMINAL (DRAPES) ×3 IMPLANT
DRAPE UNIVERSAL (DRAPES) ×3 IMPLANT
DRAPE WARM FLUID 44X44 (DRAPES) ×3 IMPLANT
DRSG TELFA 3X8 NADH (GAUZE/BANDAGES/DRESSINGS) ×3 IMPLANT
DRSG VAC ATS MED SENSATRAC (GAUZE/BANDAGES/DRESSINGS) ×1 IMPLANT
ELECT CAUTERY BLADE 6.4 (BLADE) ×3 IMPLANT
ELECT REM PT RETURN 9FT ADLT (ELECTROSURGICAL) ×3
ELECTRODE REM PT RTRN 9FT ADLT (ELECTROSURGICAL) ×2 IMPLANT
EVACUATOR SILICONE 100CC (DRAIN) ×2 IMPLANT
GLOVE BIO SURGEON STRL SZ 6.5 (GLOVE) ×3 IMPLANT
GLOVE BIOGEL PI IND STRL 6 (GLOVE) ×2 IMPLANT
GLOVE BIOGEL PI INDICATOR 6 (GLOVE) ×1
GOWN STRL REUS W/ TWL LRG LVL3 (GOWN DISPOSABLE) ×4 IMPLANT
GOWN STRL REUS W/TWL LRG LVL3 (GOWN DISPOSABLE) ×6
HANDLE SUCTION POOLE (INSTRUMENTS) ×2 IMPLANT
KIT BASIN OR (CUSTOM PROCEDURE TRAY) ×3 IMPLANT
KIT OSTOMY DRAINABLE 2.75 STR (WOUND CARE) ×1 IMPLANT
KIT TURNOVER KIT B (KITS) ×3 IMPLANT
LIGASURE IMPACT 36 18CM CVD LR (INSTRUMENTS) ×1 IMPLANT
NS IRRIG 1000ML POUR BTL (IV SOLUTION) ×6 IMPLANT
PACK GENERAL/GYN (CUSTOM PROCEDURE TRAY) ×3 IMPLANT
PAD ABD 8X10 STRL (GAUZE/BANDAGES/DRESSINGS) ×1 IMPLANT
PAD ARMBOARD 7.5X6 YLW CONV (MISCELLANEOUS) ×3 IMPLANT
PAD DRESSING TELFA 3X8 NADH (GAUZE/BANDAGES/DRESSINGS) IMPLANT
PENCIL SMOKE EVACUATOR (MISCELLANEOUS) ×3 IMPLANT
SPONGE LAP 18X18 RF (DISPOSABLE) IMPLANT
STAPLER VISISTAT 35W (STAPLE) ×3 IMPLANT
SUCTION POOLE HANDLE (INSTRUMENTS) ×3
SUT ETHILON 2 0 FS 18 (SUTURE) ×2 IMPLANT
SUT PDS AB 1 TP1 96 (SUTURE) ×2 IMPLANT
SUT SILK 2 0 SH CR/8 (SUTURE) ×3 IMPLANT
SUT SILK 2 0 TIES 10X30 (SUTURE) ×3 IMPLANT
SUT SILK 3 0 SH CR/8 (SUTURE) ×3 IMPLANT
SUT SILK 3 0 TIES 10X30 (SUTURE) ×3 IMPLANT
SUT VIC AB 3-0 SH 18 (SUTURE) ×3 IMPLANT
TAPE CLOTH SURG 4X10 WHT LF (GAUZE/BANDAGES/DRESSINGS) ×1 IMPLANT
TOWEL GREEN STERILE (TOWEL DISPOSABLE) ×3 IMPLANT
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
## 2020-08-11 NOTE — OR Nursing (Signed)
Two retained laps retrieved from patient. Final count had 2 additional laps.    XRAY performed.   Radiologist confirmed no retained laps: Dr. Haynes Dage

## 2020-08-11 NOTE — Progress Notes (Signed)
Received pt back from OR. Placed back on vent and increased FiO2 for extra support post op. Will decrease based on spo2. Pt stable throughout.

## 2020-08-11 NOTE — Transfer of Care (Signed)
Immediate Anesthesia Transfer of Care Note  Patient: PACE LAMADRID  Procedure(s) Performed: RE-EXPLORATORY LAPAROTOMY WITH PRIMARY FASCIAL CLOSURE. (N/A Abdomen) COLOSTOMY (Right Abdomen)  Patient Location: ICU  Anesthesia Type:General  Level of Consciousness: Patient remains intubated per anesthesia plan  Airway & Oxygen Therapy: Patient placed on Ventilator (see vital sign flow sheet for setting)  Post-op Assessment: Report given to RN and Post -op Vital signs reviewed and stable  Post vital signs: Reviewed  Last Vitals:  Vitals Value Taken Time  BP    Temp 36.5 C 08/11/20 1456  Pulse 77 08/11/20 1456  Resp 17 08/11/20 1456  SpO2 100 % 08/11/20 1456  Vitals shown include unvalidated device data.  Last Pain:  Vitals:   08/11/20 1000  TempSrc: Esophageal         Complications: No complications documented.

## 2020-08-11 NOTE — Progress Notes (Signed)
Patient still hypotensive dropping SBPs into low 80s and HR 105 post 2U PRBCs and 1u platelets this evening.  Notified Dr Janee Morn of having to increase neo gtt. Received orders for 1U PRBC and draw INR.

## 2020-08-12 LAB — MAGNESIUM: Magnesium: 2.2 mg/dL (ref 1.7–2.4)

## 2020-08-12 LAB — BASIC METABOLIC PANEL
Anion gap: 8 (ref 5–15)
BUN: 20 mg/dL (ref 6–20)
CO2: 23 mmol/L (ref 22–32)
Calcium: 7.7 mg/dL — ABNORMAL LOW (ref 8.9–10.3)
Chloride: 104 mmol/L (ref 98–111)
Creatinine, Ser: 1.98 mg/dL — ABNORMAL HIGH (ref 0.61–1.24)
GFR, Estimated: 44 mL/min — ABNORMAL LOW (ref >60)
Glucose, Bld: 112 mg/dL — ABNORMAL HIGH (ref 70–99)
Potassium: 4.3 mmol/L (ref 3.5–5.1)
Sodium: 135 mmol/L (ref 135–145)

## 2020-08-12 LAB — CBC WITH DIFFERENTIAL/PLATELET
Abs Immature Granulocytes: 0.37 10*3/uL — ABNORMAL HIGH (ref 0.00–0.07)
Basophils Absolute: 0 10*3/uL (ref 0.0–0.1)
Basophils Relative: 0 %
Eosinophils Absolute: 0 10*3/uL (ref 0.0–0.5)
Eosinophils Relative: 0 %
HCT: 27.6 % — ABNORMAL LOW (ref 39.0–52.0)
Hemoglobin: 9.6 g/dL — ABNORMAL LOW (ref 13.0–17.0)
Immature Granulocytes: 2 %
Lymphocytes Relative: 3 %
Lymphs Abs: 0.5 10*3/uL — ABNORMAL LOW (ref 0.7–4.0)
MCH: 29.5 pg (ref 26.0–34.0)
MCHC: 34.8 g/dL (ref 30.0–36.0)
MCV: 84.9 fL (ref 80.0–100.0)
Monocytes Absolute: 1 10*3/uL (ref 0.1–1.0)
Monocytes Relative: 6 %
Neutro Abs: 15.6 10*3/uL — ABNORMAL HIGH (ref 1.7–7.7)
Neutrophils Relative %: 89 %
Platelets: 77 10*3/uL — ABNORMAL LOW (ref 150–400)
RBC: 3.25 MIL/uL — ABNORMAL LOW (ref 4.22–5.81)
RDW: 15.1 % (ref 11.5–15.5)
WBC Morphology: INCREASED
WBC: 17.4 10*3/uL — ABNORMAL HIGH (ref 4.0–10.5)
nRBC: 0 % (ref 0.0–0.2)

## 2020-08-12 LAB — POCT I-STAT 7, (LYTES, BLD GAS, ICA,H+H)
Acid-base deficit: 1 mmol/L (ref 0.0–2.0)
Acid-base deficit: 3 mmol/L — ABNORMAL HIGH (ref 0.0–2.0)
Bicarbonate: 23.4 mmol/L (ref 20.0–28.0)
Bicarbonate: 24.4 mmol/L (ref 20.0–28.0)
Calcium, Ion: 0.98 mmol/L — ABNORMAL LOW (ref 1.15–1.40)
Calcium, Ion: 1.04 mmol/L — ABNORMAL LOW (ref 1.15–1.40)
HCT: 26 % — ABNORMAL LOW (ref 39.0–52.0)
HCT: 27 % — ABNORMAL LOW (ref 39.0–52.0)
Hemoglobin: 8.8 g/dL — ABNORMAL LOW (ref 13.0–17.0)
Hemoglobin: 9.2 g/dL — ABNORMAL LOW (ref 13.0–17.0)
O2 Saturation: 100 %
O2 Saturation: 100 %
Patient temperature: 36.4
Patient temperature: 37.5
Potassium: 4.2 mmol/L (ref 3.5–5.1)
Potassium: 4.6 mmol/L (ref 3.5–5.1)
Sodium: 138 mmol/L (ref 135–145)
Sodium: 140 mmol/L (ref 135–145)
TCO2: 25 mmol/L (ref 22–32)
TCO2: 26 mmol/L (ref 22–32)
pCO2 arterial: 41.5 mmHg (ref 32.0–48.0)
pCO2 arterial: 46.6 mmHg (ref 32.0–48.0)
pH, Arterial: 7.31 — ABNORMAL LOW (ref 7.350–7.450)
pH, Arterial: 7.373 (ref 7.350–7.450)
pO2, Arterial: 181 mmHg — ABNORMAL HIGH (ref 83.0–108.0)
pO2, Arterial: 217 mmHg — ABNORMAL HIGH (ref 83.0–108.0)

## 2020-08-12 LAB — GLUCOSE, CAPILLARY
Glucose-Capillary: 117 mg/dL — ABNORMAL HIGH (ref 70–99)
Glucose-Capillary: 113 mg/dL — ABNORMAL HIGH (ref 70–99)
Glucose-Capillary: 107 mg/dL — ABNORMAL HIGH (ref 70–99)
Glucose-Capillary: 118 mg/dL — ABNORMAL HIGH (ref 70–99)
Glucose-Capillary: 122 mg/dL — ABNORMAL HIGH (ref 70–99)

## 2020-08-12 MED ORDER — SIMETHICONE 80 MG PO CHEW
80.0000 mg | CHEWABLE_TABLET | Freq: Four times a day (QID) | ORAL | Status: DC | PRN
  Administered 2020-08-19 – 2020-08-30 (×13): 80 mg via ORAL
  Filled 2020-08-12 (×14): qty 1

## 2020-08-12 MED ORDER — ENSURE ENLIVE PO LIQD: 237.0000 mL | Freq: Two times a day (BID) | ORAL | Status: DC

## 2020-08-12 MED ORDER — SODIUM CHLORIDE 0.9 % IV SOLN
12.5000 mg | Freq: Four times a day (QID) | INTRAVENOUS | Status: DC | PRN
  Administered 2020-08-15: 12.5 mg via INTRAVENOUS
  Filled 2020-08-12 (×2): qty 0.5

## 2020-08-12 MED ORDER — MORPHINE SULFATE (PF) 2 MG/ML IV SOLN
2.0000 mg | INTRAVENOUS | Status: DC | PRN
  Administered 2020-08-12: 4 mg via INTRAVENOUS
  Administered 2020-08-12 (×2): 2 mg via INTRAVENOUS
  Administered 2020-08-12: 4 mg via INTRAVENOUS
  Filled 2020-08-12 (×2): qty 1
  Filled 2020-08-12 (×2): qty 2

## 2020-08-12 MED ORDER — HYDROMORPHONE HCL 1 MG/ML IJ SOLN
1.0000 mg | INTRAMUSCULAR | Status: DC | PRN
  Administered 2020-08-12 – 2020-08-18 (×42): 1 mg via INTRAVENOUS
  Filled 2020-08-12 (×42): qty 1

## 2020-08-12 NOTE — Procedures (Signed)
Extubation Procedure Note  Patient Details:   Name: Alejandro Shelton DOB: [birth year] MRN: [id number]   Airway Documentation:    Vent end date: 08/12/20 Vent end time: 0920   Evaluation  O2 sats: stable throughout Complications: No apparent complications Patient did tolerate procedure well. Bilateral Breath Sounds: Diminished   Yes   Pt extubated to 3L [place] per MD order. Pt had positive cuff leak noted prior to extubation. Pt able to speak and has strong productive cough post extubation. Pt stable throughout with no complications. Pt encouraged to use [place] to clear secretions.   Carolan Shiver 08/12/2020, 10:41 AM
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
## 2020-08-12 NOTE — Progress Notes (Signed)
Spoke with Dr. Sheliah Hatch regarding abdominal pain and n/v persistent after Morphine and Zofran. Orders received for Dilaudid and Phenergan and to place g tube to gravity.   After g tube placed to gravity 2500 ml of brown gastric fluid drained within 20 minutes and patient stated he felt relief from pressure and n/v.

## 2020-08-13 ENCOUNTER — Encounter (HOSPITAL_COMMUNITY): Payer: Self-pay | Admitting: Surgery

## 2020-08-13 LAB — BPAM RBC
Blood Product Expiration Date: 202205122359
Blood Product Expiration Date: 202205122359
Blood Product Expiration Date: 202205122359
Blood Product Expiration Date: 202205122359
Blood Product Expiration Date: 202205122359
Blood Product Expiration Date: 202205132359
Blood Product Expiration Date: 202205152359
Blood Product Expiration Date: 202205162359
Blood Product Expiration Date: 202205202359
Blood Product Expiration Date: 202205202359
ISSUE DATE / TIME: 202204210854
ISSUE DATE / TIME: 202204210854
ISSUE DATE / TIME: 202204210908
ISSUE DATE / TIME: 202204210908
ISSUE DATE / TIME: 202204210955
ISSUE DATE / TIME: 202204210955
ISSUE DATE / TIME: 202204211236
ISSUE DATE / TIME: 202204211540
ISSUE DATE / TIME: 202204211620
ISSUE DATE / TIME: 202204220029
Unit Type and Rh: 5100
Unit Type and Rh: 5100
Unit Type and Rh: 5100
Unit Type and Rh: 5100
Unit Type and Rh: 6200
Unit Type and Rh: 6200
Unit Type and Rh: 6200
Unit Type and Rh: 6200
Unit Type and Rh: 6200
Unit Type and Rh: 6200

## 2020-08-13 LAB — TYPE AND SCREEN
ABO/RH(D): A POS
Antibody Screen: NEGATIVE
Unit division: 0
Unit division: 0
Unit division: 0
Unit division: 0
Unit division: 0
Unit division: 0
Unit division: 0
Unit division: 0
Unit division: 0
Unit division: 0

## 2020-08-13 LAB — CBC
HCT: 23.8 % — ABNORMAL LOW (ref 39.0–52.0)
Hemoglobin: 8.1 g/dL — ABNORMAL LOW (ref 13.0–17.0)
MCH: 29.1 pg (ref 26.0–34.0)
MCHC: 34 g/dL (ref 30.0–36.0)
MCV: 85.6 fL (ref 80.0–100.0)
Platelets: 87 10*3/uL — ABNORMAL LOW (ref 150–400)
RBC: 2.78 MIL/uL — ABNORMAL LOW (ref 4.22–5.81)
RDW: 14.9 % (ref 11.5–15.5)
WBC: 17.1 10*3/uL — ABNORMAL HIGH (ref 4.0–10.5)
nRBC: 0 % (ref 0.0–0.2)

## 2020-08-13 LAB — GLUCOSE, CAPILLARY
Glucose-Capillary: 100 mg/dL — ABNORMAL HIGH (ref 70–99)
Glucose-Capillary: 115 mg/dL — ABNORMAL HIGH (ref 70–99)
Glucose-Capillary: 131 mg/dL — ABNORMAL HIGH (ref 70–99)
Glucose-Capillary: 155 mg/dL — ABNORMAL HIGH (ref 70–99)
Glucose-Capillary: 99 mg/dL (ref 70–99)
Glucose-Capillary: 99 mg/dL (ref 70–99)

## 2020-08-13 LAB — BASIC METABOLIC PANEL
Anion gap: 7 (ref 5–15)
BUN: 20 mg/dL (ref 6–20)
CO2: 26 mmol/L (ref 22–32)
Calcium: 8 mg/dL — ABNORMAL LOW (ref 8.9–10.3)
Chloride: 103 mmol/L (ref 98–111)
Creatinine, Ser: 1.81 mg/dL — ABNORMAL HIGH (ref 0.61–1.24)
GFR, Estimated: 49 mL/min — ABNORMAL LOW (ref >60)
Glucose, Bld: 106 mg/dL — ABNORMAL HIGH (ref 70–99)
Potassium: 4 mmol/L (ref 3.5–5.1)
Sodium: 136 mmol/L (ref 135–145)

## 2020-08-13 MED ORDER — FENTANYL CITRATE (PF) 100 MCG/2ML IJ SOLN
50.0000 ug | INTRAMUSCULAR | Status: DC | PRN
  Administered 2020-08-13 – 2020-08-14 (×5): 50 ug via INTRAVENOUS
  Filled 2020-08-13 (×5): qty 2

## 2020-08-13 NOTE — Plan of Care (Signed)
  Problem: Pain Managment: Goal: General experience of comfort will improve Outcome: Progressing   

## 2020-08-13 NOTE — Plan of Care (Signed)
°  Problem: Coping: °Goal: Level of anxiety will decrease °Outcome: Progressing °  °
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
## 2020-08-13 NOTE — Anesthesia Postprocedure Evaluation (Signed)
Anesthesia Post Note  Patient: Alejandro Shelton  Procedure(s) Performed: RE-EXPLORATORY LAPAROTOMY WITH PRIMARY FASCIAL CLOSURE. (N/A Abdomen) COLOSTOMY (Right Abdomen)     Patient location during evaluation: SICU Anesthesia Type: General Level of consciousness: sedated Pain management: pain level controlled Vital Signs Assessment: post-procedure vital signs reviewed and stable Respiratory status: patient remains intubated per anesthesia plan Cardiovascular status: stable Postop Assessment: no apparent nausea or vomiting Anesthetic complications: no   No complications documented.  Last Vitals:  Vitals:   08/13/20 1800 08/13/20 2000  BP: (!) 144/72   Pulse: 91   Resp: (!) 26   Temp:  37.8 C  SpO2: 96%     Last Pain:  Vitals:   08/13/20 2000  TempSrc: Oral  PainSc:                  [name]

## 2020-08-14 LAB — SURGICAL PATHOLOGY

## 2020-08-14 LAB — BASIC METABOLIC PANEL
Anion gap: 8 (ref 5–15)
BUN: 17 mg/dL (ref 6–20)
CO2: 28 mmol/L (ref 22–32)
Calcium: 8.5 mg/dL — ABNORMAL LOW (ref 8.9–10.3)
Chloride: 100 mmol/L (ref 98–111)
Creatinine, Ser: 1.49 mg/dL — ABNORMAL HIGH (ref 0.61–1.24)
GFR, Estimated: >60 mL/min (ref >60)
Glucose, Bld: 113 mg/dL — ABNORMAL HIGH (ref 70–99)
Potassium: 3.6 mmol/L (ref 3.5–5.1)
Sodium: 136 mmol/L (ref 135–145)

## 2020-08-14 LAB — CBC
HCT: 23.7 % — ABNORMAL LOW (ref 39.0–52.0)
Hemoglobin: 7.9 g/dL — ABNORMAL LOW (ref 13.0–17.0)
MCH: 28.8 pg (ref 26.0–34.0)
MCHC: 33.3 g/dL (ref 30.0–36.0)
MCV: 86.5 fL (ref 80.0–100.0)
Platelets: 166 10*3/uL (ref 150–400)
RBC: 2.74 MIL/uL — ABNORMAL LOW (ref 4.22–5.81)
RDW: 15.2 % (ref 11.5–15.5)
WBC: 19.6 10*3/uL — ABNORMAL HIGH (ref 4.0–10.5)
nRBC: 0 % (ref 0.0–0.2)

## 2020-08-14 LAB — GLUCOSE, CAPILLARY
Glucose-Capillary: 101 mg/dL — ABNORMAL HIGH (ref 70–99)
Glucose-Capillary: 103 mg/dL — ABNORMAL HIGH (ref 70–99)
Glucose-Capillary: 104 mg/dL — ABNORMAL HIGH (ref 70–99)
Glucose-Capillary: 105 mg/dL — ABNORMAL HIGH (ref 70–99)
Glucose-Capillary: 106 mg/dL — ABNORMAL HIGH (ref 70–99)
Glucose-Capillary: 88 mg/dL (ref 70–99)
Glucose-Capillary: 98 mg/dL (ref 70–99)

## 2020-08-14 MED ORDER — ENOXAPARIN SODIUM 30 MG/0.3ML ~~LOC~~ SOLN
30.0000 mg | SUBCUTANEOUS | Status: DC
  Administered 2020-08-14 – 2020-08-15 (×2): 30 mg via SUBCUTANEOUS
  Filled 2020-08-14 (×2): qty 0.3

## 2020-08-14 MED ORDER — METHOCARBAMOL 1000 MG/10ML IJ SOLN
1000.0000 mg | Freq: Three times a day (TID) | INTRAVENOUS | Status: DC
  Administered 2020-08-14 – 2020-08-15 (×3): 1000 mg via INTRAVENOUS
  Filled 2020-08-14 (×4): qty 10

## 2020-08-14 MED ORDER — ACETAMINOPHEN 10 MG/ML IV SOLN
1000.0000 mg | Freq: Four times a day (QID) | INTRAVENOUS | Status: AC
  Administered 2020-08-14 – 2020-08-15 (×4): 1000 mg via INTRAVENOUS
  Filled 2020-08-14 (×4): qty 100

## 2020-08-14 MED ORDER — SCOPOLAMINE 1 MG/3DAYS TD PT72
1.0000 | MEDICATED_PATCH | TRANSDERMAL | Status: DC
  Administered 2020-08-14: 1.5 mg via TRANSDERMAL
  Filled 2020-08-14: qty 1

## 2020-08-14 MED ORDER — POTASSIUM CHLORIDE 10 MEQ/50ML IV SOLN
10.0000 meq | INTRAVENOUS | Status: AC
  Administered 2020-08-14 (×4): 10 meq via INTRAVENOUS
  Filled 2020-08-14 (×4): qty 50

## 2020-08-14 MED ORDER — METHOCARBAMOL 1000 MG/10ML IJ SOLN
1000.0000 mg | Freq: Three times a day (TID) | INTRAVENOUS | Status: DC
  Filled 2020-08-14 (×2): qty 10

## 2020-08-14 NOTE — Consult Note (Signed)
WOC Nurse ostomy follow up Patient receiving care in [facility] 4N22. Stoma type/location: RUQ colostomy Stomal assessment/size: between 1 3/4 and 2 inches; round, edematous, moist Peristomal assessment: deferred Treatment options for stomal/peristomal skin: barrier ring Output: none at this time  Ostomy pouching: 2pc. Flat. Supplies requested:  Education provided: none  Keep the following ostomy supplies at the bedside: [employer] Gin #[phone]; skin barrier, [employer] [place] #2; barrier rings, [employer] [place] [phone]  Enrolled patient in [place] [employer] Start Discharge program: No Helmut Muster, RN, MSN, CWOCN, CNS-BC, pager [phone]
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
## 2020-08-14 NOTE — Plan of Care (Signed)
  Problem: Coping: Goal: Exhibits appropriate coping mechanism and reduced anxiety resulting from physical and emotional stress Outcome: Progressing   Problem: Coping: Goal: Level of anxiety will decrease Outcome: Progressing

## 2020-08-14 NOTE — Progress Notes (Signed)
Spoke with Dr. Cliffton Asters concerning patient complaint of abdominal tightness and lack of g tube output. Order received to flush g tube with 10 ml of saline and place to gravity.   After flushing 1100 ml of green tinged fluid drained in about ten minutes and patient stated pressure was relieved.

## 2020-08-15 LAB — CBC
HCT: 21.7 % — ABNORMAL LOW (ref 39.0–52.0)
Hemoglobin: 7.3 g/dL — ABNORMAL LOW (ref 13.0–17.0)
MCH: 29.3 pg (ref 26.0–34.0)
MCHC: 33.6 g/dL (ref 30.0–36.0)
MCV: 87.1 fL (ref 80.0–100.0)
Platelets: 187 10*3/uL (ref 150–400)
RBC: 2.49 MIL/uL — ABNORMAL LOW (ref 4.22–5.81)
RDW: 15.4 % (ref 11.5–15.5)
WBC: 15.6 10*3/uL — ABNORMAL HIGH (ref 4.0–10.5)
nRBC: 0 % (ref 0.0–0.2)

## 2020-08-15 LAB — BASIC METABOLIC PANEL
Anion gap: 9 (ref 5–15)
BUN: 18 mg/dL (ref 6–20)
CO2: 24 mmol/L (ref 22–32)
Calcium: 7.5 mg/dL — ABNORMAL LOW (ref 8.9–10.3)
Chloride: 105 mmol/L (ref 98–111)
Creatinine, Ser: 1.29 mg/dL — ABNORMAL HIGH (ref 0.61–1.24)
GFR, Estimated: >60 mL/min (ref >60)
Glucose, Bld: 92 mg/dL (ref 70–99)
Potassium: 3.2 mmol/L — ABNORMAL LOW (ref 3.5–5.1)
Sodium: 138 mmol/L (ref 135–145)

## 2020-08-15 LAB — GLUCOSE, CAPILLARY
Glucose-Capillary: 94 mg/dL (ref 70–99)
Glucose-Capillary: 92 mg/dL (ref 70–99)
Glucose-Capillary: 108 mg/dL — ABNORMAL HIGH (ref 70–99)
Glucose-Capillary: 105 mg/dL — ABNORMAL HIGH (ref 70–99)
Glucose-Capillary: 91 mg/dL (ref 70–99)
Glucose-Capillary: 104 mg/dL — ABNORMAL HIGH (ref 70–99)

## 2020-08-15 MED ORDER — ACETAMINOPHEN 10 MG/ML IV SOLN
1000.0000 mg | Freq: Four times a day (QID) | INTRAVENOUS | Status: AC
  Administered 2020-08-15 – 2020-08-16 (×3): 1000 mg via INTRAVENOUS
  Filled 2020-08-15 (×3): qty 100

## 2020-08-15 MED ORDER — PNEUMOCOCCAL VAC POLYVALENT 25 MCG/0.5ML IJ INJ
0.5000 mL | INJECTION | Freq: Once | INTRAMUSCULAR | Status: AC
  Administered 2020-08-16: 0.5 mL via INTRAMUSCULAR
  Filled 2020-08-15: qty 0.5

## 2020-08-15 MED ORDER — OXYCODONE HCL 5 MG/5ML PO SOLN
5.0000 mg | ORAL | Status: DC | PRN
  Administered 2020-08-16: 10 mg via ORAL
  Administered 2020-08-16: 5 mg via ORAL
  Administered 2020-08-17 – 2020-08-18 (×4): 10 mg via ORAL
  Administered 2020-08-18: 5 mg via ORAL
  Administered 2020-08-19 – 2020-08-31 (×26): 10 mg via ORAL
  Filled 2020-08-15 (×21): qty 10
  Filled 2020-08-15: qty 5
  Filled 2020-08-15 (×12): qty 10
  Filled 2020-08-15: qty 5

## 2020-08-15 MED ORDER — ENOXAPARIN SODIUM 30 MG/0.3ML ~~LOC~~ SOLN
30.0000 mg | Freq: Two times a day (BID) | SUBCUTANEOUS | Status: DC
  Administered 2020-08-15 – 2020-08-30 (×28): 30 mg via SUBCUTANEOUS
  Filled 2020-08-15 (×30): qty 0.3

## 2020-08-15 MED ORDER — POTASSIUM CHLORIDE 10 MEQ/50ML IV SOLN
10.0000 meq | INTRAVENOUS | Status: AC
  Administered 2020-08-15 (×10): 10 meq via INTRAVENOUS
  Filled 2020-08-15 (×9): qty 50
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
## 2020-08-15 NOTE — Consult Note (Signed)
WOC Nurse ostomy follow up Patient receiving care in [facility] 4N22. Stoma type/location:  RUQ colostomy Stomal assessment/size: 1 3/4 inches, edematous, sutures intact, moist Peristomal assessment: intact Treatment options for stomal/peristomal skin: barrier ring Output: drops of serosanginous in pouch Ostomy pouching: 2pc.  2 3/4 inch flat barrier and pouch. Education provided:  Patient was sleepy as he had recently received a dose of pain medication.  But he did look at the stoma, and watched all steps of a pouch removal and new pouch placement. Pattern and supplies at the bedside. Enrolled patient in [place] Secure Start Discharge program: No Helmut Muster, RN, MSN, [facility], CNS-BC, pager [phone]
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
## 2020-08-15 NOTE — Progress Notes (Signed)
[name] G tube has had no output since 0530, pt complaining increased pain and tightness in abdomen despite pain medications given. Communicated with MD Lovick via phone call; verbal order obtained to flush G-tube with 15-20 mL.  Instilled 15 mL, G tube immediately drained 525 output. Pt pain has improved. Will continue to monitor.
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
## 2020-08-16 ENCOUNTER — Inpatient Hospital Stay: Payer: Self-pay

## 2020-08-16 LAB — BASIC METABOLIC PANEL
Anion gap: 6 (ref 5–15)
BUN: 21 mg/dL — ABNORMAL HIGH (ref 6–20)
CO2: 27 mmol/L (ref 22–32)
Calcium: 8.4 mg/dL — ABNORMAL LOW (ref 8.9–10.3)
Chloride: 104 mmol/L (ref 98–111)
Creatinine, Ser: 1.26 mg/dL — ABNORMAL HIGH (ref 0.61–1.24)
GFR, Estimated: >60 mL/min (ref >60)
Glucose, Bld: 118 mg/dL — ABNORMAL HIGH (ref 70–99)
Potassium: 3.8 mmol/L (ref 3.5–5.1)
Sodium: 137 mmol/L (ref 135–145)

## 2020-08-16 LAB — CBC
HCT: 25.8 % — ABNORMAL LOW (ref 39.0–52.0)
Hemoglobin: 8.5 g/dL — ABNORMAL LOW (ref 13.0–17.0)
MCH: 28.7 pg (ref 26.0–34.0)
MCHC: 32.9 g/dL (ref 30.0–36.0)
MCV: 87.2 fL (ref 80.0–100.0)
Platelets: 303 10*3/uL (ref 150–400)
RBC: 2.96 MIL/uL — ABNORMAL LOW (ref 4.22–5.81)
RDW: 15.3 % (ref 11.5–15.5)
WBC: 17.2 10*3/uL — ABNORMAL HIGH (ref 4.0–10.5)
nRBC: 0.1 % (ref 0.0–0.2)

## 2020-08-16 LAB — GLUCOSE, CAPILLARY
Glucose-Capillary: 97 mg/dL (ref 70–99)
Glucose-Capillary: 112 mg/dL — ABNORMAL HIGH (ref 70–99)
Glucose-Capillary: 88 mg/dL (ref 70–99)
Glucose-Capillary: 97 mg/dL (ref 70–99)
Glucose-Capillary: 101 mg/dL — ABNORMAL HIGH (ref 70–99)

## 2020-08-16 MED ORDER — SODIUM CHLORIDE 0.9% FLUSH
10.0000 mL | Freq: Two times a day (BID) | INTRAVENOUS | Status: DC
  Administered 2020-08-16 – 2020-08-17 (×3): 10 mL
  Administered 2020-08-17: 20 mL
  Administered 2020-08-18 – 2020-08-19 (×3): 10 mL
  Administered 2020-08-19: 20 mL
  Administered 2020-08-20 – 2020-08-31 (×20): 10 mL

## 2020-08-16 MED ORDER — SODIUM CHLORIDE 0.9% FLUSH
10.0000 mL | INTRAVENOUS | Status: DC | PRN
  Administered 2020-08-20: 30 mL
  Administered 2020-08-29: 10 mL

## 2020-08-16 MED ORDER — POTASSIUM CHLORIDE 10 MEQ/50ML IV SOLN
10.0000 meq | INTRAVENOUS | Status: AC
Start: 2020-08-16 — End: 2020-08-16
  Administered 2020-08-16 (×2): 10 meq via INTRAVENOUS
  Filled 2020-08-16 (×2): qty 50
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
## 2020-08-16 NOTE — Progress Notes (Signed)
Peripherally Inserted Central Catheter Placement  The IV Nurse has discussed with the patient and/or persons authorized to consent for the patient, the purpose of this procedure and the potential benefits and risks involved with this procedure.  The benefits include less needle sticks, lab draws from the catheter, and the patient may be discharged home with the catheter. Risks include, but not limited to, infection, bleeding, blood clot (thrombus formation), and puncture of an artery; nerve damage and irregular heartbeat and possibility to perform a PICC exchange if needed/ordered by physician.  Alternatives to this procedure were also discussed.  Bard Power PICC patient education guide, fact sheet on infection prevention and patient information card has been provided to patient /or left at bedside.    PICC Placement Documentation  PICC Double Lumen 08/16/20 PICC Right Basilic 40 cm 1 cm (Active)  Exposed Catheter (cm) 1 cm 08/16/20 1248  Lumen #1 Status Flushed;Saline locked;Blood return noted 08/16/20 1248  Lumen #2 Status Flushed;Saline locked;Blood return noted 08/16/20 1248  Dressing Type Transparent;Securing device 08/16/20 1248  Dressing Status Clean;Dry;Intact 08/16/20 1248  Antimicrobial disc in place? Yes 08/16/20 1248  Safety Lock Not Applicable 08/16/20 1248  Dressing Change Due 08/23/20 08/16/20 1248       Romie Jumper 08/16/2020, 12:52 PM
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
## 2020-08-17 ENCOUNTER — Encounter (HOSPITAL_COMMUNITY): Payer: Self-pay

## 2020-08-17 ENCOUNTER — Other Ambulatory Visit: Payer: Self-pay

## 2020-08-17 LAB — CBC
HCT: 25.8 % — ABNORMAL LOW (ref 39.0–52.0)
Hemoglobin: 8.7 g/dL — ABNORMAL LOW (ref 13.0–17.0)
MCH: 29 pg (ref 26.0–34.0)
MCHC: 33.7 g/dL (ref 30.0–36.0)
MCV: 86 fL (ref 80.0–100.0)
Platelets: 360 10*3/uL (ref 150–400)
RBC: 3 MIL/uL — ABNORMAL LOW (ref 4.22–5.81)
RDW: 15.4 % (ref 11.5–15.5)
WBC: 19.2 10*3/uL — ABNORMAL HIGH (ref 4.0–10.5)
nRBC: 0.2 % (ref 0.0–0.2)

## 2020-08-17 LAB — BASIC METABOLIC PANEL
Anion gap: 9 (ref 5–15)
BUN: 22 mg/dL — ABNORMAL HIGH (ref 6–20)
CO2: 26 mmol/L (ref 22–32)
Calcium: 8.4 mg/dL — ABNORMAL LOW (ref 8.9–10.3)
Chloride: 101 mmol/L (ref 98–111)
Creatinine, Ser: 1.31 mg/dL — ABNORMAL HIGH (ref 0.61–1.24)
GFR, Estimated: >60 mL/min (ref >60)
Glucose, Bld: 112 mg/dL — ABNORMAL HIGH (ref 70–99)
Potassium: 3.6 mmol/L (ref 3.5–5.1)
Sodium: 136 mmol/L (ref 135–145)

## 2020-08-17 LAB — GLUCOSE, CAPILLARY
Glucose-Capillary: 107 mg/dL — ABNORMAL HIGH (ref 70–99)
Glucose-Capillary: 114 mg/dL — ABNORMAL HIGH (ref 70–99)
Glucose-Capillary: 102 mg/dL — ABNORMAL HIGH (ref 70–99)

## 2020-08-17 MED ORDER — ZOLPIDEM TARTRATE 5 MG PO TABS
5.0000 mg | ORAL_TABLET | Freq: Every evening | ORAL | Status: DC | PRN
  Administered 2020-08-19 – 2020-08-23 (×4): 5 mg via ORAL
  Filled 2020-08-17 (×4): qty 1

## 2020-08-17 NOTE — Consult Note (Signed)
WOC Nurse ostomy follow up Mother at bedside.  Patient lives with her and she will be assisting with his care as needed  Informed patient that he will be learning to self care and mom can be a back up and he is minimally participative.  Stoma type/location: RUQ colostomy Stomal assessment/size: 1 3/4" edematous, pink and moist Peristomal assessment: intact  Drains present at 12 o'clock and 6 o'clock.  Barrier trimmed to accommodate.  Treatment options for stomal/peristomal skin: barrier ring and 2 piece pouch.  Output liquid brown stool Ostomy pouching:2pc. 2 3/4"  Supplies in room  Education provided: Pouch change performed and mother observes. We discuss twice weekly pouch changes, emptying when 1/3 full and rationale for barrier ring. Mother states she will be here for next pouch change.  Here at all times, she says.  Enrolled patient in [place] Secure Start Discharge program: No Will follow. Maple Hudson MSN, RN, FNP-BC CWON Wound, Ostomy, Continence Nurse Pager [phone]
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
## 2020-08-18 ENCOUNTER — Inpatient Hospital Stay (HOSPITAL_COMMUNITY): Payer: Self-pay

## 2020-08-18 LAB — CBC
HCT: 28.8 % — ABNORMAL LOW (ref 39.0–52.0)
Hemoglobin: 9.5 g/dL — ABNORMAL LOW (ref 13.0–17.0)
MCH: 28 pg (ref 26.0–34.0)
MCHC: 33 g/dL (ref 30.0–36.0)
MCV: 85 fL (ref 80.0–100.0)
Platelets: 449 10*3/uL — ABNORMAL HIGH (ref 150–400)
RBC: 3.39 MIL/uL — ABNORMAL LOW (ref 4.22–5.81)
RDW: 15.5 % (ref 11.5–15.5)
WBC: 20.7 10*3/uL — ABNORMAL HIGH (ref 4.0–10.5)
nRBC: 0.1 % (ref 0.0–0.2)

## 2020-08-18 LAB — BASIC METABOLIC PANEL
Anion gap: 11 (ref 5–15)
BUN: 22 mg/dL — ABNORMAL HIGH (ref 6–20)
CO2: 25 mmol/L (ref 22–32)
Calcium: 8.7 mg/dL — ABNORMAL LOW (ref 8.9–10.3)
Chloride: 100 mmol/L (ref 98–111)
Creatinine, Ser: 1.3 mg/dL — ABNORMAL HIGH (ref 0.61–1.24)
GFR, Estimated: >60 mL/min (ref >60)
Glucose, Bld: 100 mg/dL — ABNORMAL HIGH (ref 70–99)
Potassium: 4 mmol/L (ref 3.5–5.1)
Sodium: 136 mmol/L (ref 135–145)

## 2020-08-18 LAB — PHOSPHORUS: Phosphorus: 4.2 mg/dL (ref 2.5–4.6)

## 2020-08-18 LAB — MAGNESIUM: Magnesium: 2.2 mg/dL (ref 1.7–2.4)

## 2020-08-18 LAB — GLUCOSE, CAPILLARY
Glucose-Capillary: 97 mg/dL (ref 70–99)
Glucose-Capillary: 119 mg/dL — ABNORMAL HIGH (ref 70–99)
Glucose-Capillary: 122 mg/dL — ABNORMAL HIGH (ref 70–99)

## 2020-08-18 MED ORDER — INSULIN ASPART 100 UNIT/ML IJ SOLN
0.0000 [IU] | Freq: Four times a day (QID) | INTRAMUSCULAR | Status: DC
  Administered 2020-08-19 – 2020-08-23 (×11): 1 [IU] via SUBCUTANEOUS

## 2020-08-18 MED ORDER — ACETAMINOPHEN 10 MG/ML IV SOLN
1000.0000 mg | Freq: Four times a day (QID) | INTRAVENOUS | Status: AC
  Administered 2020-08-18 – 2020-08-19 (×4): 1000 mg via INTRAVENOUS
  Filled 2020-08-18 (×5): qty 100

## 2020-08-18 MED ORDER — IOHEXOL 300 MG/ML  SOLN
100.0000 mL | Freq: Once | INTRAMUSCULAR | Status: AC | PRN
  Administered 2020-08-18: 100 mL via INTRAVENOUS

## 2020-08-18 MED ORDER — METHOCARBAMOL 1000 MG/10ML IJ SOLN
1000.0000 mg | Freq: Three times a day (TID) | INTRAVENOUS | Status: DC
  Administered 2020-08-18 – 2020-08-31 (×38): 1000 mg via INTRAVENOUS
  Filled 2020-08-18 (×7): qty 10
  Filled 2020-08-18: qty 1000
  Filled 2020-08-18 (×12): qty 10
  Filled 2020-08-18: qty 1000
  Filled 2020-08-18 (×4): qty 10
  Filled 2020-08-18 (×2): qty 1000
  Filled 2020-08-18 (×5): qty 10
  Filled 2020-08-18: qty 1000
  Filled 2020-08-18 (×9): qty 10

## 2020-08-18 MED ORDER — IOHEXOL 9 MG/ML PO SOLN
ORAL | Status: AC
  Administered 2020-08-18: 500 mL
  Filled 2020-08-18: qty 1000

## 2020-08-18 MED ORDER — CHROMIC CHLORIDE 40 MCG/10ML IV SOLN
INTRAVENOUS | Status: AC
Start: 2020-08-18 — End: 2020-08-19
  Filled 2020-08-18: qty 765.6

## 2020-08-18 MED ORDER — HYDROMORPHONE HCL 1 MG/ML IJ SOLN
0.5000 mg | INTRAMUSCULAR | Status: DC | PRN
  Administered 2020-08-18 – 2020-08-21 (×16): 1 mg via INTRAVENOUS
  Filled 2020-08-18 (×16): qty 1
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
## 2020-08-18 NOTE — Plan of Care (Signed)
  Problem: Clinical Measurements: Goal: Will remain free from infection Outcome: Progressing Goal: Respiratory complications will improve Outcome: Completed/Met   Problem: Coping: Goal: Level of anxiety will decrease Outcome: Progressing
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
## 2020-08-18 NOTE — Plan of Care (Signed)
  Problem: Education: Goal: Knowledge of General Education information will improve Description: Including pain rating scale, medication(s)/side effects and non-pharmacologic comfort measures Outcome: Progressing   Problem: Health Behavior/Discharge Planning: Goal: Ability to manage health-related needs will improve Outcome: Progressing   Problem: Clinical Measurements: Goal: Ability to maintain clinical measurements within normal limits will improve Outcome: Progressing Goal: Will remain free from infection Outcome: Progressing Goal: Diagnostic test results will improve Outcome: Progressing Goal: Cardiovascular complication will be avoided Outcome: Progressing   Problem: Activity: Goal: Risk for activity intolerance will decrease Outcome: Progressing   Problem: Nutrition: Goal: Adequate nutrition will be maintained Outcome: Progressing   Problem: Coping: Goal: Level of anxiety will decrease Outcome: Progressing   Problem: Elimination: Goal: Will not experience complications related to bowel motility Outcome: Progressing Goal: Will not experience complications related to urinary retention Outcome: Progressing   Problem: Pain Managment: Goal: General experience of comfort will improve Outcome: Progressing   Problem: Safety: Goal: Ability to remain free from injury will improve Outcome: Progressing   Problem: Skin Integrity: Goal: Risk for impaired skin integrity will decrease Outcome: Progressing   Problem: Activity: Goal: Ability to perform activities at highest level will improve Outcome: Progressing Goal: Ability to avoid complications of mobility impairment will improve Outcome: Progressing Goal: Ability to tolerate increased activity will improve Outcome: Progressing Goal: Will remain free from falls Outcome: Progressing   Problem: Respiratory: Goal: Ability to maintain adequate ventilation will improve Outcome: Progressing   Problem: Tissue  Perfusion: Goal: Hemodynamically stable with effective tissue perfusion will improve Outcome: Progressing Goal: Postoperative complications will be avoided or minimized Outcome: Progressing   Problem: Skin Integrity: Goal: Ability to maintain adequate tissue integrity will improve Outcome: Progressing   Problem: Infection: Goal: Risk for infection will decrease (spleen) Outcome: Progressing   Problem: Bowel/Gastric: Goal: Gastrointestinal status for postoperative course will improve Outcome: Progressing Goal: GI tract motility and GI tissue perfusion will improve Outcome: Progressing Goal: Ability to demonstrate the techniques of an individualized bowel program will improve Outcome: Progressing   Problem: Urinary Elimination: Goal: Ability to achieve a regular elimination pattern will improve Outcome: Progressing   Problem: Fluid Volume: Goal: Return to normovolemic state will improve Outcome: Progressing   Problem: Metabolic: Goal: Ability to maintain a metabolic balance will improve Outcome: Progressing   Problem: Education: Goal: Knowledge of the prescribed therapeutic regimen will improve Outcome: Progressing Goal: Knowledge of injury and care (of blunt abdominal trauma) will improve Outcome: Progressing   Problem: Coping: Goal: Exhibits appropriate coping mechanism and reduced anxiety resulting from physical and emotional stress Outcome: Progressing   Problem: Self-Concept: Goal: Ability to acknowledge body changes will improve Outcome: Progressing
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
## 2020-08-18 NOTE — Progress Notes (Signed)
OT NOTE  Working with PT in the bed and declining further workup due to pain.   Timmothy Euler, OTR/L  Acute Rehabilitation Services Pager: [phone] Office: [phone] .
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
## 2020-08-19 LAB — TRIGLYCERIDES: Triglycerides: 122 mg/dL (ref <150)

## 2020-08-19 LAB — DIFFERENTIAL
Abs Immature Granulocytes: 0.65 10*3/uL — ABNORMAL HIGH (ref 0.00–0.07)
Basophils Absolute: 0 10*3/uL (ref 0.0–0.1)
Basophils Relative: 0 %
Eosinophils Absolute: 0.2 10*3/uL (ref 0.0–0.5)
Eosinophils Relative: 1 %
Immature Granulocytes: 3 %
Lymphocytes Relative: 6 %
Lymphs Abs: 1.5 10*3/uL (ref 0.7–4.0)
Monocytes Absolute: 2 10*3/uL — ABNORMAL HIGH (ref 0.1–1.0)
Monocytes Relative: 8 %
Neutro Abs: 19.6 10*3/uL — ABNORMAL HIGH (ref 1.7–7.7)
Neutrophils Relative %: 82 %

## 2020-08-19 LAB — PREALBUMIN: Prealbumin: 15.2 mg/dL — ABNORMAL LOW (ref 18–38)

## 2020-08-19 LAB — URINALYSIS, ROUTINE W REFLEX MICROSCOPIC
Bacteria, UA: NONE SEEN
Bilirubin Urine: NEGATIVE
Glucose, UA: NEGATIVE mg/dL
Ketones, ur: NEGATIVE mg/dL
Leukocytes,Ua: NEGATIVE
Nitrite: NEGATIVE
Protein, ur: 100 mg/dL — AB
RBC / HPF: >50 RBC/hpf — ABNORMAL HIGH (ref 0–5)
Specific Gravity, Urine: 1.028 (ref 1.005–1.030)
pH: 6 (ref 5.0–8.0)

## 2020-08-19 LAB — COMPREHENSIVE METABOLIC PANEL
ALT: 146 U/L — ABNORMAL HIGH (ref 0–44)
AST: 56 U/L — ABNORMAL HIGH (ref 15–41)
Albumin: 2.6 g/dL — ABNORMAL LOW (ref 3.5–5.0)
Alkaline Phosphatase: 86 U/L (ref 38–126)
Anion gap: 10 (ref 5–15)
BUN: 19 mg/dL (ref 6–20)
CO2: 24 mmol/L (ref 22–32)
Calcium: 8.3 mg/dL — ABNORMAL LOW (ref 8.9–10.3)
Chloride: 97 mmol/L — ABNORMAL LOW (ref 98–111)
Creatinine, Ser: 1.17 mg/dL (ref 0.61–1.24)
GFR, Estimated: >60 mL/min (ref >60)
Glucose, Bld: 122 mg/dL — ABNORMAL HIGH (ref 70–99)
Potassium: 3.4 mmol/L — ABNORMAL LOW (ref 3.5–5.1)
Sodium: 131 mmol/L — ABNORMAL LOW (ref 135–145)
Total Bilirubin: 1.7 mg/dL — ABNORMAL HIGH (ref 0.3–1.2)
Total Protein: 6.4 g/dL — ABNORMAL LOW (ref 6.5–8.1)

## 2020-08-19 LAB — GLUCOSE, CAPILLARY
Glucose-Capillary: 133 mg/dL — ABNORMAL HIGH (ref 70–99)
Glucose-Capillary: 132 mg/dL — ABNORMAL HIGH (ref 70–99)
Glucose-Capillary: 116 mg/dL — ABNORMAL HIGH (ref 70–99)

## 2020-08-19 LAB — CBC
HCT: 26.6 % — ABNORMAL LOW (ref 39.0–52.0)
Hemoglobin: 9.4 g/dL — ABNORMAL LOW (ref 13.0–17.0)
MCH: 29.8 pg (ref 26.0–34.0)
MCHC: 35.3 g/dL (ref 30.0–36.0)
MCV: 84.4 fL (ref 80.0–100.0)
Platelets: 492 10*3/uL — ABNORMAL HIGH (ref 150–400)
RBC: 3.15 MIL/uL — ABNORMAL LOW (ref 4.22–5.81)
RDW: 15.1 % (ref 11.5–15.5)
WBC: 24 10*3/uL — ABNORMAL HIGH (ref 4.0–10.5)
nRBC: 0 % (ref 0.0–0.2)

## 2020-08-19 LAB — PHOSPHORUS: Phosphorus: 3.2 mg/dL (ref 2.5–4.6)

## 2020-08-19 LAB — MAGNESIUM: Magnesium: 2.1 mg/dL (ref 1.7–2.4)

## 2020-08-19 MED ORDER — TRAVASOL 10 % IV SOLN
INTRAVENOUS | Status: AC
  Filled 2020-08-19: qty 1113.6

## 2020-08-19 MED ORDER — SODIUM CHLORIDE 0.9 % IV SOLN
INTRAVENOUS | Status: DC
  Administered 2020-08-23: 1000 mL via INTRAVENOUS

## 2020-08-19 MED ORDER — POTASSIUM CHLORIDE 10 MEQ/50ML IV SOLN
10.0000 meq | INTRAVENOUS | Status: AC
  Administered 2020-08-19 (×5): 10 meq via INTRAVENOUS
  Filled 2020-08-19 (×5): qty 50

## 2020-08-19 NOTE — Progress Notes (Signed)
Pt left tube clamped as verbally ordered by PA in pt room. Will continue to closely monitor. Pt oob in chair getting a bath. IS education and breathing exercises provided to pt and he voices understanding. Dionne Bucy RN
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
## 2020-08-19 NOTE — Progress Notes (Signed)
Pt report discomfort in abd and tightness, pain medication helped a little, pt requested for G-tube to be unclamp for draining to see if it will help him. 1100 output drained and pt report some relieve. Pt G-tube was clamped per pt request to see he can tolerated the clamped G-tube for the rest of the shift. Pt reported discomfort again around 1645 and after flushing pt requested for G-tube to be unclamped. G-tube unclamped and opened to gravity. Pt in bed with mother at bedside and call light within reach. Will continue to closely monitor pt. Dionne Bucy RN
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
## 2020-08-20 ENCOUNTER — Inpatient Hospital Stay (HOSPITAL_COMMUNITY): Payer: Self-pay

## 2020-08-20 LAB — CBC
HCT: 27.6 % — ABNORMAL LOW (ref 39.0–52.0)
Hemoglobin: 9.5 g/dL — ABNORMAL LOW (ref 13.0–17.0)
MCH: 29 pg (ref 26.0–34.0)
MCHC: 34.4 g/dL (ref 30.0–36.0)
MCV: 84.1 fL (ref 80.0–100.0)
Platelets: 551 10*3/uL — ABNORMAL HIGH (ref 150–400)
RBC: 3.28 MIL/uL — ABNORMAL LOW (ref 4.22–5.81)
RDW: 15.2 % (ref 11.5–15.5)
WBC: 26.5 10*3/uL — ABNORMAL HIGH (ref 4.0–10.5)
nRBC: 0 % (ref 0.0–0.2)

## 2020-08-20 LAB — GLUCOSE, CAPILLARY
Glucose-Capillary: 125 mg/dL — ABNORMAL HIGH (ref 70–99)
Glucose-Capillary: 130 mg/dL — ABNORMAL HIGH (ref 70–99)
Glucose-Capillary: 120 mg/dL — ABNORMAL HIGH (ref 70–99)
Glucose-Capillary: 115 mg/dL — ABNORMAL HIGH (ref 70–99)

## 2020-08-20 LAB — BASIC METABOLIC PANEL
Anion gap: 10 (ref 5–15)
BUN: 18 mg/dL (ref 6–20)
CO2: 23 mmol/L (ref 22–32)
Calcium: 8.4 mg/dL — ABNORMAL LOW (ref 8.9–10.3)
Chloride: 100 mmol/L (ref 98–111)
Creatinine, Ser: 1.19 mg/dL (ref 0.61–1.24)
GFR, Estimated: >60 mL/min (ref >60)
Glucose, Bld: 117 mg/dL — ABNORMAL HIGH (ref 70–99)
Potassium: 3.8 mmol/L (ref 3.5–5.1)
Sodium: 133 mmol/L — ABNORMAL LOW (ref 135–145)

## 2020-08-20 LAB — MAGNESIUM: Magnesium: 2 mg/dL (ref 1.7–2.4)

## 2020-08-20 LAB — URINE CULTURE

## 2020-08-20 LAB — PHOSPHORUS: Phosphorus: 3.1 mg/dL (ref 2.5–4.6)

## 2020-08-20 MED ORDER — TRAVASOL 10 % IV SOLN
INTRAVENOUS | Status: AC
  Filled 2020-08-20: qty 1531.2

## 2020-08-20 MED ORDER — POTASSIUM CHLORIDE 10 MEQ/50ML IV SOLN
10.0000 meq | INTRAVENOUS | Status: AC
  Administered 2020-08-20 (×2): 10 meq via INTRAVENOUS
  Filled 2020-08-20 (×2): qty 50
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
## 2020-08-20 NOTE — Progress Notes (Signed)
Patient requested to rest throughout the night and to skip midnight vital signs. Ambien given per patient request. Patient is asleep and seems very comfortable. No distress noted.
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
## 2020-08-21 ENCOUNTER — Inpatient Hospital Stay (HOSPITAL_COMMUNITY): Payer: Self-pay

## 2020-08-21 HISTORY — PX: IR GJ TUBE CHANGE: IMG1440

## 2020-08-21 LAB — CBC
HCT: 27.7 % — ABNORMAL LOW (ref 39.0–52.0)
Hemoglobin: 9.4 g/dL — ABNORMAL LOW (ref 13.0–17.0)
MCH: 29.3 pg (ref 26.0–34.0)
MCHC: 33.9 g/dL (ref 30.0–36.0)
MCV: 86.3 fL (ref 80.0–100.0)
Platelets: 645 10*3/uL — ABNORMAL HIGH (ref 150–400)
RBC: 3.21 MIL/uL — ABNORMAL LOW (ref 4.22–5.81)
RDW: 15.2 % (ref 11.5–15.5)
WBC: 25.8 10*3/uL — ABNORMAL HIGH (ref 4.0–10.5)
nRBC: 0 % (ref 0.0–0.2)

## 2020-08-21 LAB — DIFFERENTIAL
Abs Immature Granulocytes: 0.79 10*3/uL — ABNORMAL HIGH (ref 0.00–0.07)
Basophils Absolute: 0.1 10*3/uL (ref 0.0–0.1)
Basophils Relative: 0 %
Eosinophils Absolute: 0.2 10*3/uL (ref 0.0–0.5)
Eosinophils Relative: 1 %
Immature Granulocytes: 3 %
Lymphocytes Relative: 6 %
Lymphs Abs: 1.6 10*3/uL (ref 0.7–4.0)
Monocytes Absolute: 2 10*3/uL — ABNORMAL HIGH (ref 0.1–1.0)
Monocytes Relative: 8 %
Neutro Abs: 21.1 10*3/uL — ABNORMAL HIGH (ref 1.7–7.7)
Neutrophils Relative %: 82 %

## 2020-08-21 LAB — PHOSPHORUS: Phosphorus: 3.4 mg/dL (ref 2.5–4.6)

## 2020-08-21 LAB — COMPREHENSIVE METABOLIC PANEL
ALT: 210 U/L — ABNORMAL HIGH (ref 0–44)
AST: 128 U/L — ABNORMAL HIGH (ref 15–41)
Albumin: 2.7 g/dL — ABNORMAL LOW (ref 3.5–5.0)
Alkaline Phosphatase: 121 U/L (ref 38–126)
Anion gap: 11 (ref 5–15)
BUN: 17 mg/dL (ref 6–20)
CO2: 23 mmol/L (ref 22–32)
Calcium: 8.7 mg/dL — ABNORMAL LOW (ref 8.9–10.3)
Chloride: 97 mmol/L — ABNORMAL LOW (ref 98–111)
Creatinine, Ser: 1.11 mg/dL (ref 0.61–1.24)
GFR, Estimated: >60 mL/min (ref >60)
Glucose, Bld: 115 mg/dL — ABNORMAL HIGH (ref 70–99)
Potassium: 4.4 mmol/L (ref 3.5–5.1)
Sodium: 131 mmol/L — ABNORMAL LOW (ref 135–145)
Total Bilirubin: 0.9 mg/dL (ref 0.3–1.2)
Total Protein: 6.7 g/dL (ref 6.5–8.1)

## 2020-08-21 LAB — GLUCOSE, CAPILLARY
Glucose-Capillary: 128 mg/dL — ABNORMAL HIGH (ref 70–99)
Glucose-Capillary: 111 mg/dL — ABNORMAL HIGH (ref 70–99)
Glucose-Capillary: 129 mg/dL — ABNORMAL HIGH (ref 70–99)
Glucose-Capillary: 130 mg/dL — ABNORMAL HIGH (ref 70–99)

## 2020-08-21 LAB — PREALBUMIN: Prealbumin: 19.7 mg/dL (ref 18–38)

## 2020-08-21 LAB — TRIGLYCERIDES: Triglycerides: 112 mg/dL (ref <150)

## 2020-08-21 LAB — MAGNESIUM: Magnesium: 2 mg/dL (ref 1.7–2.4)

## 2020-08-21 MED ORDER — IOHEXOL 300 MG/ML  SOLN
50.0000 mL | Freq: Once | INTRAMUSCULAR | Status: AC | PRN
  Administered 2020-08-21: 20 mL

## 2020-08-21 MED ORDER — LIDOCAINE HCL 1 % IJ SOLN
INTRAMUSCULAR | Status: AC
  Filled 2020-08-21: qty 20

## 2020-08-21 MED ORDER — HYDROMORPHONE HCL 1 MG/ML IJ SOLN: 0.5000 mg | INTRAMUSCULAR | Status: DC | PRN

## 2020-08-21 MED ORDER — LIDOCAINE HCL (PF) 1 % IJ SOLN
INTRAMUSCULAR | Status: DC | PRN
  Administered 2020-08-21: 10 mL

## 2020-08-21 MED ORDER — IOHEXOL 300 MG/ML  SOLN: 100.0000 mL | Freq: Once | INTRAMUSCULAR | Status: DC | PRN

## 2020-08-21 MED ORDER — STERILE WATER FOR INJECTION IJ SOLN
INTRAMUSCULAR | Status: AC
  Filled 2020-08-21: qty 10

## 2020-08-21 MED ORDER — LIDOCAINE 5 % EX PTCH
1.0000 | MEDICATED_PATCH | CUTANEOUS | Status: DC
  Administered 2020-08-21 – 2020-08-31 (×11): 1 via TRANSDERMAL
  Filled 2020-08-21 (×11): qty 1

## 2020-08-21 MED ORDER — HYDROMORPHONE HCL 1 MG/ML IJ SOLN
0.5000 mg | INTRAMUSCULAR | Status: DC | PRN
  Administered 2020-08-21 – 2020-08-24 (×23): 1 mg via INTRAVENOUS
  Administered 2020-08-24: 1.5 mg via INTRAVENOUS
  Administered 2020-08-24: 1 mg via INTRAVENOUS
  Filled 2020-08-21 (×2): qty 1
  Filled 2020-08-21: qty 2
  Filled 2020-08-21 (×22): qty 1

## 2020-08-21 MED ORDER — TRAVASOL 10 % IV SOLN
INTRAVENOUS | Status: AC
  Filled 2020-08-21: qty 1531.2

## 2020-08-21 MED ORDER — ACETAMINOPHEN 10 MG/ML IV SOLN
1000.0000 mg | Freq: Four times a day (QID) | INTRAVENOUS | Status: AC
  Administered 2020-08-21 – 2020-08-22 (×4): 1000 mg via INTRAVENOUS
  Filled 2020-08-21 (×4): qty 100

## 2020-08-21 MED ORDER — LIDOCAINE VISCOUS HCL 2 % MT SOLN
OROMUCOSAL | Status: AC
  Filled 2020-08-21: qty 15
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
## 2020-08-21 NOTE — Consult Note (Signed)
WOC Nurse ostomy follow up Attempted to see patient for ostomy and teaching. Patient remains out of the room. Helmut Muster, RN, MSN, CWOCN, CNS-BC, pager [phone]
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
## 2020-08-21 NOTE — Progress Notes (Signed)
PT Cancellation Note  Patient Details Name: Alejandro Shelton MRN: [id number] DOB: Jul 29, 1985   Cancelled Treatment:    Reason Eval/Treat Not Completed: Other (comment) Pt requesting to rest.  Lillia Pauls, PT, DPT Acute Rehabilitation Services Pager [phone] Office [phone]    Norval Morton 08/21/2020, 10:50 AM

## 2020-08-21 NOTE — Procedures (Signed)
Interventional Radiology Procedure Note  Procedure:  Attempted gastrostomy conversion to gastrojejunostomy  Findings: Please refer to procedural dictation for full description.  Due to tortuous anatomy of indwelling gastrostomy and surgical gastrojejunostomy, unable to track new gastrojejunal tube over wire without losing access.  Attempt at replacement of standard gastrostomy tube (18 Fr and 14 Fr) unsuccessful, despite track balloon dilation up to 8 mm. Attempted placement of 12 Fr Foley along side wire, also unsuccessful, unable to push beyond anterior wall of stomach.  Eventually, 5 Fr angled tip catheter was placed and sutured in place for future access.  Complications: None immediate  Estimated Blood Loss: < 5 mL  Recommendations: Recommend conversion of gastrostomy to gastrojejunostomy with endoscopic guidance. May need nastoenteric gastric decompression tube in mean time.    Marliss Coots, MD

## 2020-08-21 NOTE — Consult Note (Signed)
WOC Nurse ostomy follow up Attempted to see patient in [facility] [id number]. Patient was in VIR. Will attempt to see around 1300 today. May have to be postponed until tomorrow. Helmut Muster, RN, MSN, CWOCN, CNS-BC, pager [phone]

## 2020-08-21 NOTE — Progress Notes (Signed)
OT Cancellation Note  Patient Details Name: Alejandro Shelton MRN: [id number] DOB: 11-May-1985   Cancelled Treatment:    Reason Eval/Treat Not Completed: Other (comment) (Pt wanting to rest. Will return as schedule allows.)  Florie Carico M Jameson Tormey Parrish Daddario MSOT, OTR/L Acute Rehab Pager: [phone] Office: [phone] 08/21/2020, 10:07 AM
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
## 2020-08-21 NOTE — Progress Notes (Signed)
Pt returned back from IR. Pt was in pain. Dilaudid given. Dressing to abdomen was not intact. Dressed at this time. Ostomy was leaking. Pt refused for it to be changed at this time due to pain. Gauze applied around ostomy at this time. Pt asking for water. Pt does not have g-tube due to unable to place in IR. No means of being able to drain stomach at this time. Stomach is distended and painful to pt. Educated pt that at this time he would need to be NPO until I was able to talk with MD about plan. Pt understands. Pt irritable and upset at this time. Dilaudid was given as oxycodone is unable to be given via PO or G-tube.

## 2020-08-22 ENCOUNTER — Inpatient Hospital Stay (HOSPITAL_COMMUNITY): Payer: Self-pay | Admitting: Anesthesiology

## 2020-08-22 ENCOUNTER — Encounter (HOSPITAL_COMMUNITY): Admission: EM | Disposition: A | Discharge status: Home Health | Payer: Self-pay | Source: Home / Self Care

## 2020-08-22 ENCOUNTER — Encounter (HOSPITAL_COMMUNITY): Payer: Self-pay

## 2020-08-22 HISTORY — PX: PEG PLACEMENT: SHX5437

## 2020-08-22 HISTORY — PX: ESOPHAGOGASTRODUODENOSCOPY: SHX5428

## 2020-08-22 LAB — BASIC METABOLIC PANEL
Anion gap: 10 (ref 5–15)
BUN: 18 mg/dL (ref 6–20)
CO2: 22 mmol/L (ref 22–32)
Calcium: 8.5 mg/dL — ABNORMAL LOW (ref 8.9–10.3)
Chloride: 99 mmol/L (ref 98–111)
Creatinine, Ser: 1.12 mg/dL (ref 0.61–1.24)
GFR, Estimated: >60 mL/min (ref >60)
Glucose, Bld: 127 mg/dL — ABNORMAL HIGH (ref 70–99)
Potassium: 4.6 mmol/L (ref 3.5–5.1)
Sodium: 131 mmol/L — ABNORMAL LOW (ref 135–145)

## 2020-08-22 LAB — GLUCOSE, CAPILLARY
Glucose-Capillary: 116 mg/dL — ABNORMAL HIGH (ref 70–99)
Glucose-Capillary: 123 mg/dL — ABNORMAL HIGH (ref 70–99)
Glucose-Capillary: 123 mg/dL — ABNORMAL HIGH (ref 70–99)
Glucose-Capillary: 126 mg/dL — ABNORMAL HIGH (ref 70–99)
Glucose-Capillary: 136 mg/dL — ABNORMAL HIGH (ref 70–99)

## 2020-08-22 LAB — CBC
HCT: 27 % — ABNORMAL LOW (ref 39.0–52.0)
Hemoglobin: 8.8 g/dL — ABNORMAL LOW (ref 13.0–17.0)
MCH: 27.9 pg (ref 26.0–34.0)
MCHC: 32.6 g/dL (ref 30.0–36.0)
MCV: 85.7 fL (ref 80.0–100.0)
Platelets: 622 10*3/uL — ABNORMAL HIGH (ref 150–400)
RBC: 3.15 MIL/uL — ABNORMAL LOW (ref 4.22–5.81)
RDW: 14.9 % (ref 11.5–15.5)
WBC: 24.7 10*3/uL — ABNORMAL HIGH (ref 4.0–10.5)
nRBC: 0 % (ref 0.0–0.2)

## 2020-08-22 SURGERY — INSERTION, PEG TUBE
Anesthesia: Monitor Anesthesia Care | Wound class: Clean Contaminated

## 2020-08-22 MED ORDER — GUAIFENESIN 100 MG/5ML PO SOLN
20.0000 mL | Freq: Two times a day (BID) | ORAL | Status: DC
  Administered 2020-08-22 – 2020-08-29 (×10): 400 mg
  Filled 2020-08-22 (×3): qty 10
  Filled 2020-08-22: qty 40
  Filled 2020-08-22: qty 10
  Filled 2020-08-22: qty 20
  Filled 2020-08-22: qty 10
  Filled 2020-08-22: qty 20
  Filled 2020-08-22 (×2): qty 10
  Filled 2020-08-22: qty 20
  Filled 2020-08-22: qty 40
  Filled 2020-08-22: qty 10
  Filled 2020-08-22: qty 20
  Filled 2020-08-22: qty 40
  Filled 2020-08-22 (×2): qty 20

## 2020-08-22 MED ORDER — BUTAMBEN-TETRACAINE-BENZOCAINE 2-2-14 % EX AERO
INHALATION_SPRAY | CUTANEOUS | Status: DC | PRN
  Administered 2020-08-22: 2 via TOPICAL

## 2020-08-22 MED ORDER — PROPOFOL 500 MG/50ML IV EMUL
INTRAVENOUS | Status: DC | PRN
  Administered 2020-08-22: 100 ug/kg/min via INTRAVENOUS

## 2020-08-22 MED ORDER — PROPOFOL 10 MG/ML IV BOLUS
INTRAVENOUS | Status: DC | PRN
  Administered 2020-08-22 (×2): 30 mg via INTRAVENOUS
  Administered 2020-08-22: 20 mg via INTRAVENOUS
  Administered 2020-08-22: 30 mg via INTRAVENOUS
  Administered 2020-08-22: 20 mg via INTRAVENOUS
  Administered 2020-08-22: 30 mg via INTRAVENOUS
  Administered 2020-08-22: 20 mg via INTRAVENOUS

## 2020-08-22 MED ORDER — SODIUM CHLORIDE 0.9 % IV SOLN: INTRAVENOUS | Status: DC | PRN

## 2020-08-22 MED ORDER — PHENOL 1.4 % MT LIQD
1.0000 | OROMUCOSAL | Status: DC | PRN
  Administered 2020-08-22: 1 via OROMUCOSAL
  Filled 2020-08-22: qty 177

## 2020-08-22 MED ORDER — GUAIFENESIN ER 600 MG PO TB12: 600.0000 mg | ORAL_TABLET | Freq: Two times a day (BID) | ORAL | Status: DC

## 2020-08-22 MED ORDER — WHITE PETROLATUM EX OINT
TOPICAL_OINTMENT | CUTANEOUS | Status: AC
  Filled 2020-08-22: qty 28.35

## 2020-08-22 MED ORDER — TRAVASOL 10 % IV SOLN
INTRAVENOUS | Status: AC
  Filled 2020-08-22: qty 1531.2

## 2020-08-22 NOTE — Consult Note (Addendum)
WOC Nurse ostomy follow up Patient receiving care in [facility] 4N08. Patient's mother in room at time of my visit Stoma type/location: RUQ colostomy Stomal assessment/size: pink, moist, productive Peristomal assessment: deferred, pouch changed yesterday by primary RN Treatment options for stomal/peristomal skin: barrier ring Output: 25 ml liquid green, much flatus Ostomy pouching: 2pc. [employer] [place] [phone] Gigi Gin #2 for skin barrier, [employer] [place] [phone] for barrier ring Education provided:  Without any instructions, patient sat on the side of the bed opened and emptied his pouch, cleaned the tail, and closed the pouch. He knew what the barrier ring was for. I explained that since the pouch was just placed yesterday, the WOC nurse will assist him with the change process tomorrow or Thursday. Enrolled patient in [place] Secure Start Discharge program: Yes with supplies going to his mother's address.  She has agreed to open the box and follow the instructions to receive continued supplies.  [employer] [place] numbers and quantities for supplies provided to the [place] for ordering and placement at the bedside. Helmut Muster, RN, MSN, CWOCN, CNS-BC, pager [phone]

## 2020-08-22 NOTE — Progress Notes (Signed)
PT Cancellation Note  Patient Details Name: Alejandro Shelton MRN: [id number] DOB: 11/22/1985   Cancelled Treatment:    Reason Eval/Treat Not Completed: Patient at procedure or test/unavailable - will check back.  [name], PT DPT Acute Rehabilitation Services Pager [phone]  Office [phone]    [name] 08/22/2020, 9:55 AM
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
## 2020-08-22 NOTE — Anesthesia Procedure Notes (Signed)
Procedure Name: MAC Date/Time: 08/22/2020 9:45 AM Performed by: Jenne Campus, CRNA Pre-anesthesia Checklist: Patient identified, Emergency Drugs available, Patient being monitored, Suction available and Timeout performed Oxygen Delivery Method: Nasal cannula

## 2020-08-22 NOTE — Progress Notes (Signed)
OT Cancellation Note  Patient Details Name: Alejandro Shelton MRN: [id number] DOB: 09/21/1985   Cancelled Treatment:    Reason Eval/Treat Not Completed: Patient at procedure or test/ unavailable (Will return as schedule allows.)  [facility] MSOT, OTR/L Acute Rehab Pager: [phone] Office: [phone] 08/22/2020, 9:11 AM
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
## 2020-08-22 NOTE — Transfer of Care (Signed)
Immediate Anesthesia Transfer of Care Note  Patient: Alejandro Shelton  Procedure(s) Performed: PERCUTANEOUS ENDOSCOPIC GASTROSTOMY (PEG) PLACEMENT (N/A ) ESOPHAGOGASTRODUODENOSCOPY (EGD) (N/A )  Patient Location: Endoscopy Unit  Anesthesia Type:MAC  Level of Consciousness: awake, oriented and patient cooperative  Airway & Oxygen Therapy: Patient Spontanous Breathing and Patient connected to nasal cannula oxygen  Post-op Assessment: Report given to RN and Post -op Vital signs reviewed and stable  Post vital signs: Reviewed  Last Vitals:  Vitals Value Taken Time  BP    Temp    Pulse    Resp    SpO2      Last Pain:  Vitals:   08/22/20 0854  TempSrc: Oral  PainSc: 7       Patients Stated Pain Goal: 0 (29/52/84 1324)  Complications: No complications documented.

## 2020-08-22 NOTE — Consult Note (Signed)
WOC Nurse ostomy follow up I attempted to see the patient for ostomy care and teaching, but the patient is in endoscopy. Helmut Muster, RN, MSN, CWOCN, CNS-BC, pager [phone]

## 2020-08-22 NOTE — Anesthesia Postprocedure Evaluation (Signed)
Anesthesia Post Note  Patient: JEN EPPINGER  Procedure(s) Performed: PERCUTANEOUS ENDOSCOPIC GASTROSTOMY (PEG) PLACEMENT (N/A ) ESOPHAGOGASTRODUODENOSCOPY (EGD) (N/A )     Patient location during evaluation: Endoscopy Anesthesia Type: MAC Level of consciousness: awake and alert, patient cooperative and oriented Pain management: pain level controlled Vital Signs Assessment: post-procedure vital signs reviewed and stable Respiratory status: spontaneous breathing, nonlabored ventilation and respiratory function stable Cardiovascular status: blood pressure returned to baseline and stable Postop Assessment: no apparent nausea or vomiting Anesthetic complications: no   No complications documented.  Last Vitals:  Vitals:   08/22/20 1030 08/22/20 1040  BP: 128/85 (!) 137/91  Pulse: (!) 108 (!) 114  Resp: (!) 24 (!) 29  Temp:    SpO2: 98% 97%    Last Pain:  Vitals:   08/22/20 1040  TempSrc:   PainSc: 9                  Ashe Graybeal,E. Ashantia Amaral

## 2020-08-23 ENCOUNTER — Encounter (HOSPITAL_COMMUNITY): Payer: Self-pay | Admitting: General Surgery

## 2020-08-23 LAB — BASIC METABOLIC PANEL
Anion gap: 10 (ref 5–15)
BUN: 19 mg/dL (ref 6–20)
CO2: 24 mmol/L (ref 22–32)
Calcium: 8.6 mg/dL — ABNORMAL LOW (ref 8.9–10.3)
Chloride: 97 mmol/L — ABNORMAL LOW (ref 98–111)
Creatinine, Ser: 1.13 mg/dL (ref 0.61–1.24)
GFR, Estimated: >60 mL/min (ref >60)
Glucose, Bld: 131 mg/dL — ABNORMAL HIGH (ref 70–99)
Potassium: 4.6 mmol/L (ref 3.5–5.1)
Sodium: 131 mmol/L — ABNORMAL LOW (ref 135–145)

## 2020-08-23 LAB — GLUCOSE, CAPILLARY
Glucose-Capillary: 103 mg/dL — ABNORMAL HIGH (ref 70–99)
Glucose-Capillary: 125 mg/dL — ABNORMAL HIGH (ref 70–99)
Glucose-Capillary: 127 mg/dL — ABNORMAL HIGH (ref 70–99)

## 2020-08-23 LAB — CBC
HCT: 25.4 % — ABNORMAL LOW (ref 39.0–52.0)
Hemoglobin: 8.6 g/dL — ABNORMAL LOW (ref 13.0–17.0)
MCH: 28.8 pg (ref 26.0–34.0)
MCHC: 33.9 g/dL (ref 30.0–36.0)
MCV: 84.9 fL (ref 80.0–100.0)
Platelets: 722 10*3/uL — ABNORMAL HIGH (ref 150–400)
RBC: 2.99 MIL/uL — ABNORMAL LOW (ref 4.22–5.81)
RDW: 15.1 % (ref 11.5–15.5)
WBC: 22 10*3/uL — ABNORMAL HIGH (ref 4.0–10.5)
nRBC: 0 % (ref 0.0–0.2)

## 2020-08-23 MED ORDER — TRACE MINERALS CU-MN-SE-ZN 300-55-60-3000 MCG/ML IV SOLN
INTRAVENOUS | Status: AC
  Filled 2020-08-23: qty 1531.2

## 2020-08-23 NOTE — Plan of Care (Signed)
  Problem: Education: Goal: Knowledge of General Education information will improve Description Including pain rating scale, medication(s)/side effects and non-pharmacologic comfort measures Outcome: Progressing   Problem: Health Behavior/Discharge Planning: Goal: Ability to manage health-related needs will improve Outcome: Progressing   
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
## 2020-08-23 NOTE — Consult Note (Signed)
WOC Nurse ostomy follow up Stoma type/location: RUQ colostomy Patient in bed. In a lot of pain and requests to do teaching Thursday. Bedside RN is administering Dilaudid at this time.  He has been up and walking twice.  His pouch is intact I will see him Thursday AM.   Will follow.  Maple Hudson MSN, RN, FNP-BC CWON Wound, Ostomy, Continence Nurse Pager [phone]
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
## 2020-08-24 LAB — COMPREHENSIVE METABOLIC PANEL
ALT: 156 U/L — ABNORMAL HIGH (ref 0–44)
AST: 61 U/L — ABNORMAL HIGH (ref 15–41)
Albumin: 2.4 g/dL — ABNORMAL LOW (ref 3.5–5.0)
Alkaline Phosphatase: 132 U/L — ABNORMAL HIGH (ref 38–126)
Anion gap: 9 (ref 5–15)
BUN: 21 mg/dL — ABNORMAL HIGH (ref 6–20)
CO2: 23 mmol/L (ref 22–32)
Calcium: 8.3 mg/dL — ABNORMAL LOW (ref 8.9–10.3)
Chloride: 98 mmol/L (ref 98–111)
Creatinine, Ser: 1.01 mg/dL (ref 0.61–1.24)
GFR, Estimated: >60 mL/min (ref >60)
Glucose, Bld: 121 mg/dL — ABNORMAL HIGH (ref 70–99)
Potassium: 4.2 mmol/L (ref 3.5–5.1)
Sodium: 130 mmol/L — ABNORMAL LOW (ref 135–145)
Total Bilirubin: 1.1 mg/dL (ref 0.3–1.2)
Total Protein: 6.7 g/dL (ref 6.5–8.1)

## 2020-08-24 LAB — CBC
HCT: 24.4 % — ABNORMAL LOW (ref 39.0–52.0)
Hemoglobin: 8.1 g/dL — ABNORMAL LOW (ref 13.0–17.0)
MCH: 28.4 pg (ref 26.0–34.0)
MCHC: 33.2 g/dL (ref 30.0–36.0)
MCV: 85.6 fL (ref 80.0–100.0)
Platelets: 700 10*3/uL — ABNORMAL HIGH (ref 150–400)
RBC: 2.85 MIL/uL — ABNORMAL LOW (ref 4.22–5.81)
RDW: 15 % (ref 11.5–15.5)
WBC: 18.7 10*3/uL — ABNORMAL HIGH (ref 4.0–10.5)
nRBC: 0 % (ref 0.0–0.2)

## 2020-08-24 LAB — GLUCOSE, CAPILLARY
Glucose-Capillary: 111 mg/dL — ABNORMAL HIGH (ref 70–99)
Glucose-Capillary: 129 mg/dL — ABNORMAL HIGH (ref 70–99)

## 2020-08-24 LAB — PHOSPHORUS: Phosphorus: 3.9 mg/dL (ref 2.5–4.6)

## 2020-08-24 LAB — MAGNESIUM: Magnesium: 1.9 mg/dL (ref 1.7–2.4)

## 2020-08-24 MED ORDER — BOOST / RESOURCE BREEZE PO LIQD CUSTOM
1.0000 | Freq: Two times a day (BID) | ORAL | Status: DC
  Administered 2020-08-29 – 2020-08-30 (×3): 1 via ORAL

## 2020-08-24 MED ORDER — HYDROMORPHONE HCL 1 MG/ML IJ SOLN
0.5000 mg | INTRAMUSCULAR | Status: DC | PRN
  Administered 2020-08-24 – 2020-08-31 (×54): 1 mg via INTRAVENOUS
  Filled 2020-08-24 (×55): qty 1

## 2020-08-24 MED ORDER — TRAVASOL 10 % IV SOLN
INTRAVENOUS | Status: AC
  Filled 2020-08-24: qty 1531.2

## 2020-08-24 MED ORDER — ACETAMINOPHEN 500 MG PO TABS
1000.0000 mg | ORAL_TABLET | Freq: Four times a day (QID) | ORAL | Status: DC
  Administered 2020-08-24 – 2020-08-31 (×28): 1000 mg via ORAL
  Filled 2020-08-24 (×31): qty 2

## 2020-08-24 MED ORDER — MELATONIN 5 MG PO TABS
5.0000 mg | ORAL_TABLET | Freq: Every evening | ORAL | Status: DC | PRN
  Administered 2020-08-24: 5 mg via ORAL
  Filled 2020-08-24: qty 1

## 2020-08-24 MED ORDER — TRAMADOL HCL 50 MG PO TABS
50.0000 mg | ORAL_TABLET | Freq: Four times a day (QID) | ORAL | Status: DC
  Administered 2020-08-24 – 2020-08-31 (×27): 50 mg via ORAL
  Filled 2020-08-24 (×27): qty 1
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
## 2020-08-24 NOTE — Progress Notes (Signed)
Staples removed from chest/abdomen x3. Abdominal wet-dry wound dressing complete. Moderate drainage at all tube sites, enforced dressings with drain gauge.
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
## 2020-08-25 LAB — BASIC METABOLIC PANEL
Anion gap: 6 (ref 5–15)
BUN: 16 mg/dL (ref 6–20)
CO2: 27 mmol/L (ref 22–32)
Calcium: 8.4 mg/dL — ABNORMAL LOW (ref 8.9–10.3)
Chloride: 97 mmol/L — ABNORMAL LOW (ref 98–111)
Creatinine, Ser: 1.02 mg/dL (ref 0.61–1.24)
GFR, Estimated: >60 mL/min (ref >60)
Glucose, Bld: 120 mg/dL — ABNORMAL HIGH (ref 70–99)
Potassium: 3.9 mmol/L (ref 3.5–5.1)
Sodium: 130 mmol/L — ABNORMAL LOW (ref 135–145)

## 2020-08-25 LAB — MAGNESIUM: Magnesium: 2.2 mg/dL (ref 1.7–2.4)

## 2020-08-25 LAB — GLUCOSE, CAPILLARY
Glucose-Capillary: 112 mg/dL — ABNORMAL HIGH (ref 70–99)
Glucose-Capillary: 123 mg/dL — ABNORMAL HIGH (ref 70–99)
Glucose-Capillary: 137 mg/dL — ABNORMAL HIGH (ref 70–99)

## 2020-08-25 MED ORDER — TRAVASOL 10 % IV SOLN
INTRAVENOUS | Status: AC
  Filled 2020-08-25: qty 1531.2
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
## 2020-08-26 LAB — URINALYSIS, ROUTINE W REFLEX MICROSCOPIC
Bacteria, UA: NONE SEEN
Bilirubin Urine: NEGATIVE
Glucose, UA: NEGATIVE mg/dL
Ketones, ur: NEGATIVE mg/dL
Leukocytes,Ua: NEGATIVE
Nitrite: NEGATIVE
Protein, ur: 30 mg/dL — AB
RBC / HPF: >50 RBC/hpf — ABNORMAL HIGH (ref 0–5)
Specific Gravity, Urine: 1.016 (ref 1.005–1.030)
pH: 6 (ref 5.0–8.0)

## 2020-08-26 LAB — CBC
HCT: 27.9 % — ABNORMAL LOW (ref 39.0–52.0)
Hemoglobin: 9.1 g/dL — ABNORMAL LOW (ref 13.0–17.0)
MCH: 28.3 pg (ref 26.0–34.0)
MCHC: 32.6 g/dL (ref 30.0–36.0)
MCV: 86.9 fL (ref 80.0–100.0)
Platelets: 735 10*3/uL — ABNORMAL HIGH (ref 150–400)
RBC: 3.21 MIL/uL — ABNORMAL LOW (ref 4.22–5.81)
RDW: 15.1 % (ref 11.5–15.5)
WBC: 14.4 10*3/uL — ABNORMAL HIGH (ref 4.0–10.5)
nRBC: 0 % (ref 0.0–0.2)

## 2020-08-26 LAB — BASIC METABOLIC PANEL
Anion gap: 10 (ref 5–15)
BUN: 15 mg/dL (ref 6–20)
CO2: 27 mmol/L (ref 22–32)
Calcium: 8.9 mg/dL (ref 8.9–10.3)
Chloride: 96 mmol/L — ABNORMAL LOW (ref 98–111)
Creatinine, Ser: 1.07 mg/dL (ref 0.61–1.24)
GFR, Estimated: >60 mL/min (ref >60)
Glucose, Bld: 106 mg/dL — ABNORMAL HIGH (ref 70–99)
Potassium: 4.4 mmol/L (ref 3.5–5.1)
Sodium: 133 mmol/L — ABNORMAL LOW (ref 135–145)

## 2020-08-26 LAB — GLUCOSE, CAPILLARY
Glucose-Capillary: 111 mg/dL — ABNORMAL HIGH (ref 70–99)
Glucose-Capillary: 110 mg/dL — ABNORMAL HIGH (ref 70–99)

## 2020-08-26 LAB — MAGNESIUM: Magnesium: 2.3 mg/dL (ref 1.7–2.4)

## 2020-08-26 LAB — PHOSPHORUS: Phosphorus: 4.3 mg/dL (ref 2.5–4.6)

## 2020-08-26 MED ORDER — TRAVASOL 10 % IV SOLN
INTRAVENOUS | Status: AC
  Filled 2020-08-26: qty 1531.2

## 2020-08-26 MED ORDER — SODIUM CHLORIDE 0.9 % IV BOLUS
1000.0000 mL | Freq: Once | INTRAVENOUS | Status: AC
  Administered 2020-08-26: 1000 mL via INTRAVENOUS

## 2020-08-26 NOTE — Progress Notes (Signed)
   08/26/20 1940  Assess: MEWS Score  Temp 98.9 F (37.2 C)  BP (!) 158/86  Pulse Rate (!) 112  ECG Heart Rate (!) 114  Resp 18  Level of Consciousness Alert  SpO2 95 %  O2 Device Room Air  Assess: MEWS Score  MEWS Temp 0  MEWS Systolic 0  MEWS Pulse 2  MEWS RR 0  MEWS LOC 0  MEWS Score 2  MEWS Score Color Yellow  Assess: if the MEWS score is Yellow or Red  Were vital signs taken at a resting state? Yes  Focused Assessment No change from prior assessment  Early Detection of Sepsis Score *See Row Information* Low  MEWS guidelines implemented *See Row Information* Yes  Treat  MEWS Interventions Administered scheduled meds/treatments  Take Vital Signs  Increase Vital Sign Frequency  Yellow: Q 2hr X 2 then Q 4hr X 2, if remains yellow, continue Q 4hrs  Escalate  MEWS: Escalate Yellow: discuss with charge nurse/RN and consider discussing with provider and RRT  Notify: Charge Nurse/RN  Name of Charge Nurse/RN Notified Gladys, RN  Date Charge Nurse/RN Notified 08/26/20  Time Charge Nurse/RN Notified 1953  Document  Progress note created (see row info) Yes
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
## 2020-08-26 NOTE — Progress Notes (Signed)
Seen for PM check around 1600 5/7 One large volume void this afternoon Kept in room for demonstration-- thin pink-tinged urine, no clots, translucent. UA actually improved in hemoglobin content still with >50 RBC compared to last UA 1 wk prior Will continue to trend urine output, quality, and serum Hg; revisit liberalizing diet tomorrow Patient agreeable
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
## 2020-08-27 LAB — GLUCOSE, CAPILLARY: Glucose-Capillary: 122 mg/dL — ABNORMAL HIGH (ref 70–99)

## 2020-08-27 LAB — BASIC METABOLIC PANEL
Anion gap: 8 (ref 5–15)
BUN: 18 mg/dL (ref 6–20)
CO2: 23 mmol/L (ref 22–32)
Calcium: 8.5 mg/dL — ABNORMAL LOW (ref 8.9–10.3)
Chloride: 100 mmol/L (ref 98–111)
Creatinine, Ser: 1.19 mg/dL (ref 0.61–1.24)
GFR, Estimated: >60 mL/min (ref >60)
Glucose, Bld: 137 mg/dL — ABNORMAL HIGH (ref 70–99)
Potassium: 4.2 mmol/L (ref 3.5–5.1)
Sodium: 131 mmol/L — ABNORMAL LOW (ref 135–145)

## 2020-08-27 MED ORDER — TRAVASOL 10 % IV SOLN
INTRAVENOUS | Status: AC
  Filled 2020-08-27: qty 1531.2

## 2020-08-27 NOTE — Plan of Care (Signed)
  Problem: Education: Goal: Knowledge of General Education information will improve Description: Including pain rating scale, medication(s)/side effects and non-pharmacologic comfort measures Outcome: Progressing   Problem: Health Behavior/Discharge Planning: Goal: Ability to manage health-related needs will improve Outcome: Progressing   Problem: Clinical Measurements: Goal: Ability to maintain clinical measurements within normal limits will improve Outcome: Progressing Goal: Will remain free from infection Outcome: Progressing Goal: Diagnostic test results will improve Outcome: Progressing Goal: Cardiovascular complication will be avoided Outcome: Progressing   Problem: Activity: Goal: Risk for activity intolerance will decrease Outcome: Progressing   Problem: Nutrition: Goal: Adequate nutrition will be maintained Outcome: Progressing   Problem: Coping: Goal: Level of anxiety will decrease Outcome: Progressing   Problem: Elimination: Goal: Will not experience complications related to bowel motility Outcome: Progressing Goal: Will not experience complications related to urinary retention Outcome: Progressing   Problem: Pain Managment: Goal: General experience of comfort will improve Outcome: Progressing   Problem: Safety: Goal: Ability to remain free from injury will improve Outcome: Progressing   Problem: Skin Integrity: Goal: Risk for impaired skin integrity will decrease Outcome: Progressing   Problem: Activity: Goal: Ability to perform activities at highest level will improve Outcome: Progressing Goal: Ability to avoid complications of mobility impairment will improve Outcome: Progressing Goal: Ability to tolerate increased activity will improve Outcome: Progressing Goal: Will remain free from falls Outcome: Progressing   Problem: Respiratory: Goal: Ability to maintain adequate ventilation will improve Outcome: Progressing   Problem: Tissue  Perfusion: Goal: Hemodynamically stable with effective tissue perfusion will improve Outcome: Progressing Goal: Postoperative complications will be avoided or minimized Outcome: Progressing   Problem: Skin Integrity: Goal: Ability to maintain adequate tissue integrity will improve Outcome: Progressing   Problem: Infection: Goal: Risk for infection will decrease (spleen) Outcome: Progressing   Problem: Bowel/Gastric: Goal: Gastrointestinal status for postoperative course will improve Outcome: Progressing Goal: GI tract motility and GI tissue perfusion will improve Outcome: Progressing Goal: Ability to demonstrate the techniques of an individualized bowel program will improve Outcome: Progressing   Problem: Urinary Elimination: Goal: Ability to achieve a regular elimination pattern will improve Outcome: Progressing   Problem: Fluid Volume: Goal: Return to normovolemic state will improve Outcome: Progressing   Problem: Metabolic: Goal: Ability to maintain a metabolic balance will improve Outcome: Progressing   Problem: Education: Goal: Knowledge of the prescribed therapeutic regimen will improve Outcome: Progressing Goal: Knowledge of injury and care (of blunt abdominal trauma) will improve Outcome: Progressing   Problem: Coping: Goal: Exhibits appropriate coping mechanism and reduced anxiety resulting from physical and emotional stress Outcome: Progressing   Problem: Self-Concept: Goal: Ability to acknowledge body changes will improve Outcome: Progressing

## 2020-08-27 NOTE — Progress Notes (Signed)
Patient refusing dressing change on midline incision; dressing looks clean, dry, and intact with no notable drainage at this time.  Pt did let me change the split gauze dressing around the peg tube due to scant drainage.
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
## 2020-08-28 LAB — DIFFERENTIAL
Abs Immature Granulocytes: 0.57 10*3/uL — ABNORMAL HIGH (ref 0.00–0.07)
Basophils Absolute: 0.1 10*3/uL (ref 0.0–0.1)
Basophils Relative: 1 %
Eosinophils Absolute: 0.3 10*3/uL (ref 0.0–0.5)
Eosinophils Relative: 2 %
Immature Granulocytes: 4 %
Lymphocytes Relative: 8 %
Lymphs Abs: 1.3 10*3/uL (ref 0.7–4.0)
Monocytes Absolute: 1.5 10*3/uL — ABNORMAL HIGH (ref 0.1–1.0)
Monocytes Relative: 9 %
Neutro Abs: 12.2 10*3/uL — ABNORMAL HIGH (ref 1.7–7.7)
Neutrophils Relative %: 76 %

## 2020-08-28 LAB — CBC
HCT: 27 % — ABNORMAL LOW (ref 39.0–52.0)
Hemoglobin: 8.8 g/dL — ABNORMAL LOW (ref 13.0–17.0)
MCH: 28.1 pg (ref 26.0–34.0)
MCHC: 32.6 g/dL (ref 30.0–36.0)
MCV: 86.3 fL (ref 80.0–100.0)
Platelets: 653 10*3/uL — ABNORMAL HIGH (ref 150–400)
RBC: 3.13 MIL/uL — ABNORMAL LOW (ref 4.22–5.81)
RDW: 15.1 % (ref 11.5–15.5)
WBC: 16 10*3/uL — ABNORMAL HIGH (ref 4.0–10.5)
nRBC: 0 % (ref 0.0–0.2)

## 2020-08-28 LAB — PREALBUMIN: Prealbumin: 27.9 mg/dL (ref 18–38)

## 2020-08-28 LAB — COMPREHENSIVE METABOLIC PANEL
ALT: 155 U/L — ABNORMAL HIGH (ref 0–44)
AST: 45 U/L — ABNORMAL HIGH (ref 15–41)
Albumin: 2.5 g/dL — ABNORMAL LOW (ref 3.5–5.0)
Alkaline Phosphatase: 149 U/L — ABNORMAL HIGH (ref 38–126)
Anion gap: 9 (ref 5–15)
BUN: 18 mg/dL (ref 6–20)
CO2: 24 mmol/L (ref 22–32)
Calcium: 8.9 mg/dL (ref 8.9–10.3)
Chloride: 98 mmol/L (ref 98–111)
Creatinine, Ser: 1.2 mg/dL (ref 0.61–1.24)
GFR, Estimated: >60 mL/min (ref >60)
Glucose, Bld: 111 mg/dL — ABNORMAL HIGH (ref 70–99)
Potassium: 4.8 mmol/L (ref 3.5–5.1)
Sodium: 131 mmol/L — ABNORMAL LOW (ref 135–145)
Total Bilirubin: 0.8 mg/dL (ref 0.3–1.2)
Total Protein: 7.7 g/dL (ref 6.5–8.1)

## 2020-08-28 LAB — TRIGLYCERIDES: Triglycerides: 99 mg/dL (ref <150)

## 2020-08-28 LAB — PHOSPHORUS: Phosphorus: 4.4 mg/dL (ref 2.5–4.6)

## 2020-08-28 LAB — MAGNESIUM: Magnesium: 2.2 mg/dL (ref 1.7–2.4)

## 2020-08-28 MED ORDER — TRAVASOL 10 % IV SOLN
INTRAVENOUS | Status: AC
  Filled 2020-08-28: qty 1531.2
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
## 2020-08-29 LAB — CBC
HCT: 29.8 % — ABNORMAL LOW (ref 39.0–52.0)
Hemoglobin: 9.6 g/dL — ABNORMAL LOW (ref 13.0–17.0)
MCH: 27.8 pg (ref 26.0–34.0)
MCHC: 32.2 g/dL (ref 30.0–36.0)
MCV: 86.4 fL (ref 80.0–100.0)
Platelets: 681 10*3/uL — ABNORMAL HIGH (ref 150–400)
RBC: 3.45 MIL/uL — ABNORMAL LOW (ref 4.22–5.81)
RDW: 15 % (ref 11.5–15.5)
WBC: 15.9 10*3/uL — ABNORMAL HIGH (ref 4.0–10.5)
nRBC: 0 % (ref 0.0–0.2)

## 2020-08-29 LAB — COMPREHENSIVE METABOLIC PANEL
ALT: 142 U/L — ABNORMAL HIGH (ref 0–44)
AST: 39 U/L (ref 15–41)
Albumin: 2.7 g/dL — ABNORMAL LOW (ref 3.5–5.0)
Alkaline Phosphatase: 161 U/L — ABNORMAL HIGH (ref 38–126)
Anion gap: 7 (ref 5–15)
BUN: 17 mg/dL (ref 6–20)
CO2: 25 mmol/L (ref 22–32)
Calcium: 9.2 mg/dL (ref 8.9–10.3)
Chloride: 99 mmol/L (ref 98–111)
Creatinine, Ser: 1.01 mg/dL (ref 0.61–1.24)
GFR, Estimated: >60 mL/min (ref >60)
Glucose, Bld: 105 mg/dL — ABNORMAL HIGH (ref 70–99)
Potassium: 4.6 mmol/L (ref 3.5–5.1)
Sodium: 131 mmol/L — ABNORMAL LOW (ref 135–145)
Total Bilirubin: 0.6 mg/dL (ref 0.3–1.2)
Total Protein: 8.1 g/dL (ref 6.5–8.1)

## 2020-08-29 MED ORDER — TRAVASOL 10 % IV SOLN
INTRAVENOUS | Status: AC
  Filled 2020-08-29: qty 765.6
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
## 2020-08-29 NOTE — Plan of Care (Signed)
  Problem: Education: Goal: Knowledge of General Education information will improve Description Including pain rating scale, medication(s)/side effects and non-pharmacologic comfort measures Outcome: Progressing   Problem: Health Behavior/Discharge Planning: Goal: Ability to manage health-related needs will improve Outcome: Progressing   
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
## 2020-08-30 MED ORDER — ENSURE ENLIVE PO LIQD
237.0000 mL | Freq: Three times a day (TID) | ORAL | Status: DC
  Administered 2020-08-30 – 2020-08-31 (×2): 237 mL via ORAL

## 2020-08-30 MED ORDER — ENSURE ENLIVE PO LIQD
237.0000 mL | Freq: Two times a day (BID) | ORAL | Status: DC
  Administered 2020-08-30: 237 mL via ORAL

## 2020-08-30 MED ORDER — POLYETHYLENE GLYCOL 3350 17 G PO PACK
17.0000 g | PACK | Freq: Two times a day (BID) | ORAL | Status: DC
  Administered 2020-08-30: 17 g via ORAL
  Filled 2020-08-30 (×2): qty 1
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
## 2020-08-30 NOTE — Plan of Care (Signed)
  Problem: Education: Goal: Knowledge of General Education information will improve Description: Including pain rating scale, medication(s)/side effects and non-pharmacologic comfort measures Outcome: Completed/Met   Problem: Clinical Measurements: Goal: Ability to maintain clinical measurements within normal limits will improve Outcome: Progressing Goal: Will remain free from infection Outcome: Progressing Goal: Diagnostic test results will improve Outcome: Progressing Goal: Cardiovascular complication will be avoided Outcome: Completed/Met   Problem: Activity: Goal: Risk for activity intolerance will decrease Outcome: Completed/Met   Problem: Nutrition: Goal: Adequate nutrition will be maintained Outcome: Progressing   Problem: Coping: Goal: Level of anxiety will decrease Outcome: Progressing
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
## 2020-08-31 ENCOUNTER — Other Ambulatory Visit (HOSPITAL_COMMUNITY): Payer: Self-pay

## 2020-08-31 LAB — CBC
HCT: 30.3 % — ABNORMAL LOW (ref 39.0–52.0)
Hemoglobin: 9.8 g/dL — ABNORMAL LOW (ref 13.0–17.0)
MCH: 27.6 pg (ref 26.0–34.0)
MCHC: 32.3 g/dL (ref 30.0–36.0)
MCV: 85.4 fL (ref 80.0–100.0)
Platelets: 654 10*3/uL — ABNORMAL HIGH (ref 150–400)
RBC: 3.55 MIL/uL — ABNORMAL LOW (ref 4.22–5.81)
RDW: 14.8 % (ref 11.5–15.5)
WBC: 16.4 10*3/uL — ABNORMAL HIGH (ref 4.0–10.5)
nRBC: 0 % (ref 0.0–0.2)

## 2020-08-31 LAB — BASIC METABOLIC PANEL
Anion gap: 12 (ref 5–15)
BUN: 15 mg/dL (ref 6–20)
CO2: 20 mmol/L — ABNORMAL LOW (ref 22–32)
Calcium: 9.7 mg/dL (ref 8.9–10.3)
Chloride: 97 mmol/L — ABNORMAL LOW (ref 98–111)
Creatinine, Ser: 1.02 mg/dL (ref 0.61–1.24)
GFR, Estimated: >60 mL/min (ref >60)
Glucose, Bld: 107 mg/dL — ABNORMAL HIGH (ref 70–99)
Potassium: 4.4 mmol/L (ref 3.5–5.1)
Sodium: 129 mmol/L — ABNORMAL LOW (ref 135–145)

## 2020-08-31 MED ORDER — METHOCARBAMOL 750 MG PO TABS
750.0000 mg | ORAL_TABLET | Freq: Three times a day (TID) | ORAL | Status: DC
  Administered 2020-08-31 (×2): 750 mg via ORAL
  Filled 2020-08-31 (×2): qty 1

## 2020-08-31 MED ORDER — ACETAMINOPHEN 500 MG PO TABS
1000.0000 mg | ORAL_TABLET | Freq: Four times a day (QID) | ORAL | 0 refills | Status: DC | PRN
  Filled 2020-08-31: qty 30, 4d supply, fill #0

## 2020-08-31 MED ORDER — TRAMADOL HCL 50 MG PO TABS
100.0000 mg | ORAL_TABLET | Freq: Four times a day (QID) | ORAL | 0 refills | Status: DC | PRN
  Filled 2020-08-31: qty 28, 4d supply, fill #0

## 2020-08-31 MED ORDER — TRAMADOL HCL 50 MG PO TABS
100.0000 mg | ORAL_TABLET | Freq: Four times a day (QID) | ORAL | Status: DC
  Administered 2020-08-31: 100 mg via ORAL
  Filled 2020-08-31: qty 2

## 2020-08-31 MED ORDER — METHOCARBAMOL 750 MG PO TABS
750.0000 mg | ORAL_TABLET | Freq: Three times a day (TID) | ORAL | 0 refills | Status: DC | PRN
  Filled 2020-08-31: qty 30, 10d supply, fill #0

## 2020-08-31 MED ORDER — PANTOPRAZOLE SODIUM 40 MG PO TBEC
40.0000 mg | DELAYED_RELEASE_TABLET | Freq: Every day | ORAL | 0 refills | Status: DC
  Filled 2020-08-31: qty 30, 30d supply, fill #0

## 2020-08-31 NOTE — TOC Transition Note (Signed)
Transition of Care [facility]) - CM/SW Discharge Note   Patient Details  Name: Alejandro Shelton MRN: [id number] Date of Birth: 08/21/1985  Transition of Care [facility]) CM/SW Contact:  Glennon Mac, RN Phone Number: 08/31/2020, 3:51 PM   Clinical Narrative:   Pt medically stable for discharge home with mother to assist with care.  PT/OT recommending no OP follow up.  Pt needs HHRN for wound care at discharge; referral to [facility] for [facility].  Start of care date 09/04/20, and this is acceptable with MD.  Referral to [facility] for DME; RW and 3 in 1 to be delivered to bedside prior to dc.  Pt is uninsured, but is eligible for medication assistance through [facility] program. DC Rx sent to [facility] and filled using MATCH letter.  Pt has been referred to [facility], and they will call patient for follow up.      Final next level of care: Home w Home Health Services Barriers to Discharge: Barriers Resolved   Patient Goals and CMS Choice Patient states their goals for this hospitalization and ongoing recovery are:: to go home CMS Medicare.gov Compare Post Acute Care list provided to:: Patient Choice offered to / list presented to : Patient  Discharge Placement                       Discharge Plan and Services   Discharge Planning Services: CM Consult Post Acute Care Choice: Home Health          DME Arranged: 3-N-1,Walker rolling DME Agency: [facility] Date DME Agency Contacted: 08/31/20 Time DME Agency Contacted: 1311 Representative spoke with at DME Agency: Velna Hatchet HH Arranged: RN HH Agency: [facility] Date HH Agency Contacted: 08/31/20 Time HH Agency Contacted: 1300 Representative spoke with at [facility] Agency: Pearson Grippe  Social Determinants of Health (SDOH) Interventions     Readmission Risk Interventions No flowsheet data found.  Quintella Baton, RN, BSN  Trauma/Neuro ICU Case Manager [phone]

## 2020-08-31 NOTE — Progress Notes (Signed)
Changed dressing and educated mother & family member on how to do dressing changes after discharge verbalized understanding. Patient was provided with a couple days of wound care and colostomy supplies until Home health services makes first visit. Patient and family verbalized understanding of providing wound care. RX's was provided by [facility] delivered to room by pharmacy staff. Patient also had rolling walker and BSC delivered to room. All DME equipment, RX's personal items and wound care  and colostomy supplies packed up and sent with patient.  Discharge instructions, RX's and follow up appts explained and provided to patient verbalized understanding. Patient left floor via wheelchair accompanied by staff. No c/o pain or shortness of breath at d/c.   Jasmeen Fritsch, Kae Heller, RN

## 2020-08-31 NOTE — Plan of Care (Signed)
  Problem: Health Behavior/Discharge Planning: Goal: Ability to manage health-related needs will improve Outcome: Progressing   Problem: Clinical Measurements: Goal: Ability to maintain clinical measurements within normal limits will improve Outcome: Progressing Goal: Will remain free from infection Outcome: Progressing   
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
## 2020-08-31 NOTE — Discharge Instructions (Signed)
Please go to the following website to apply for Medicaid: [email or web address] or call [phone] for assistance.
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
## 2020-08-31 NOTE — Consult Note (Signed)
WOC Nurse ostomy follow up This is the second pouch change patient has performed for me.  Last pouch change was 08/24/20 with me.  Patient encouraged to perform this independently with me observing and helping as needed.  HE lives alone and wants to be able to manage this without assistance. I am confident he can do this.  He expresses that all of this is depressing.  I offer emotional support and offer encouragement that we will assist him in getting back into normal life. Stoma type/location: RUQ colostomy Stomal assessment/size: 2"  Peristomal assessment: Intact  Multiple tubes and drains in the peristomal area.  We must cut two slits in the barrier to accommodate.  Patient is able to do this.  Treatment options for stomal/peristomal skin: barrier ring Output thick brown stool.  Ostomy pouching: 2pc. 2 3/4" pouch with barrier ring. ORdered 5 additional pouch sets today for discharge. He has been enrolled in secure start. He is given information regarding indigent program although he has applied for Medicaid.  He is interested in closed end pouches if possible  And I will have additional samples sent to him.   Education provided: Patient removes old pouch.  Tenderness around drain tubes.  He cleanses skin and pats dry. Applies barrier ring.  He has cut his barrier to 2" independently and snaps pouch and barrier together.  He applies bag.  We demonstrate emptying into toilet.  Cleaning end of bag with toilet paper rolled and rolling closed. He demonstrates burping the bag to expel flatus, but I have ordered filtered pouches for home as well. He feels he can manage this.  He will need ongoing teaching for wound care and drain management. He would benefit from [facility] if possible. He is still weak but has no stairs to climb to get into his home. He feels his home bathroom is set up to do self care.  I give him additional written materials on life with an ostomy and outpatient information for the ostomy clinic if he  needs assistance after discharge. [phone]) Enrolled patient in [place] Secure Start Discharge program: Yes Will follow thru discharge.  [place]le [place] MSN, RN, FNP-BC CWON Wound, Ostomy, Continence Nurse Pager [phone]

## 2020-08-31 NOTE — Plan of Care (Signed)
  Problem: Increased Nutrient Needs (NI-5.1) Goal: Food and/or nutrient delivery Description: Individualized approach for food/nutrient provision. Outcome: Adequate for Discharge   Problem: Health Behavior/Discharge Planning: Goal: Ability to manage health-related needs will improve 08/31/2020 1707 by Genevie Ann, RN Outcome: Adequate for Discharge 08/31/2020 0911 by Genevie Ann, RN Outcome: Progressing   Problem: Clinical Measurements: Goal: Ability to maintain clinical measurements within normal limits will improve 08/31/2020 1707 by Genevie Ann, RN Outcome: Adequate for Discharge 08/31/2020 0911 by Genevie Ann, RN Outcome: Progressing Goal: Will remain free from infection 08/31/2020 1707 by Genevie Ann, RN Outcome: Adequate for Discharge 08/31/2020 0911 by Genevie Ann, RN Outcome: Progressing Goal: Diagnostic test results will improve Outcome: Adequate for Discharge   Problem: Nutrition: Goal: Adequate nutrition will be maintained Outcome: Adequate for Discharge   Problem: Nutrition: Goal: Adequate nutrition will be maintained Outcome: Adequate for Discharge   Problem: Coping: Goal: Level of anxiety will decrease Outcome: Adequate for Discharge   Problem: Elimination: Goal: Will not experience complications related to bowel motility Outcome: Adequate for Discharge Goal: Will not experience complications related to urinary retention Outcome: Adequate for Discharge   Problem: Pain Managment: Goal: General experience of comfort will improve Outcome: Adequate for Discharge   Problem: Safety: Goal: Ability to remain free from injury will improve Outcome: Adequate for Discharge   Problem: Safety: Goal: Ability to remain free from injury will improve Outcome: Adequate for Discharge   Problem: Skin Integrity: Goal: Risk for impaired skin integrity will decrease Outcome: Adequate for Discharge   Problem: Activity: Goal: Ability to  perform activities at highest level will improve Outcome: Adequate for Discharge Goal: Ability to avoid complications of mobility impairment will improve Outcome: Adequate for Discharge Goal: Ability to tolerate increased activity will improve Outcome: Adequate for Discharge Goal: Will remain free from falls Outcome: Adequate for Discharge   Problem: Respiratory: Goal: Ability to maintain adequate ventilation will improve Outcome: Adequate for Discharge   Problem: Respiratory: Goal: Ability to maintain adequate ventilation will improve Outcome: Adequate for Discharge   Problem: Tissue Perfusion: Goal: Hemodynamically stable with effective tissue perfusion will improve Outcome: Adequate for Discharge Goal: Postoperative complications will be avoided or minimized Outcome: Adequate for Discharge   Problem: Tissue Perfusion: Goal: Hemodynamically stable with effective tissue perfusion will improve Outcome: Adequate for Discharge Goal: Postoperative complications will be avoided or minimized Outcome: Adequate for Discharge   Problem: Skin Integrity: Goal: Ability to maintain adequate tissue integrity will improve Outcome: Adequate for Discharge   Problem: Skin Integrity: Goal: Ability to maintain adequate tissue integrity will improve Outcome: Adequate for Discharge   Problem: Infection: Goal: Risk for infection will decrease (spleen) Outcome: Adequate for Discharge   Problem: Bowel/Gastric: Goal: Gastrointestinal status for postoperative course will improve Outcome: Adequate for Discharge Goal: GI tract motility and GI tissue perfusion will improve Outcome: Adequate for Discharge Goal: Ability to demonstrate the techniques of an individualized bowel program will improve Outcome: Adequate for Discharge

## 2020-08-31 NOTE — Progress Notes (Signed)
CSW spoke with patient at bedside to discuss how to apply for Medicaid - CSW placed information in AVS for further review.  Edwin Dada, MSW, LCSW Transitions of Care  Clinical Social Worker II [phone]

## 2020-09-05 ENCOUNTER — Other Ambulatory Visit: Payer: Self-pay

## 2020-09-05 ENCOUNTER — Encounter (HOSPITAL_COMMUNITY)
Admission: RE | Admit: 2020-09-05 | Discharge: 2020-09-05 | Disposition: A | Discharge status: Home / Self Care | Payer: Self-pay | Source: Ambulatory Visit | Attending: Nurse Practitioner | Admitting: Nurse Practitioner

## 2020-09-05 DIAGNOSIS — Z433 Encounter for attention to colostomy: Secondary | ICD-10-CM | POA: Insufficient documentation

## 2020-09-05 DIAGNOSIS — K9400 Colostomy complication, unspecified: Secondary | ICD-10-CM
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
## 2020-09-05 NOTE — Discharge Instructions (Signed)
Call [facility] for follow up appointment.   Call ostomy clinic as needed.  I will call prescription for ostomy supplies to [facility] Update hospital and doctor/pharmacy with Medicaid numbers when you get it.  Rest, eat balanced diet and heal.   New template cut for ostomy pouch Instructed how to use deodorant.

## 2020-09-06 ENCOUNTER — Other Ambulatory Visit (HOSPITAL_COMMUNITY): Payer: Self-pay | Admitting: Nurse Practitioner

## 2020-09-06 DIAGNOSIS — K9400 Colostomy complication, unspecified: Secondary | ICD-10-CM

## 2020-09-07 ENCOUNTER — Other Ambulatory Visit (HOSPITAL_COMMUNITY): Payer: Self-pay | Admitting: Nurse Practitioner

## 2020-09-07 DIAGNOSIS — K9400 Colostomy complication, unspecified: Secondary | ICD-10-CM

## 2020-09-12 ENCOUNTER — Other Ambulatory Visit (HOSPITAL_COMMUNITY): Payer: Self-pay | Admitting: Nurse Practitioner

## 2020-09-12 DIAGNOSIS — K9400 Colostomy complication, unspecified: Secondary | ICD-10-CM

## 2020-09-14 ENCOUNTER — Other Ambulatory Visit: Payer: Self-pay

## 2020-09-14 ENCOUNTER — Other Ambulatory Visit (HOSPITAL_COMMUNITY): Payer: Self-pay | Admitting: Surgery

## 2020-09-14 ENCOUNTER — Ambulatory Visit (HOSPITAL_BASED_OUTPATIENT_CLINIC_OR_DEPARTMENT_OTHER)
Admission: RE | Admit: 2020-09-14 | Discharge: 2020-09-14 | Disposition: A | Discharge status: Home / Self Care | Payer: Self-pay | Source: Ambulatory Visit | Attending: Surgery | Admitting: Surgery

## 2020-09-14 DIAGNOSIS — K839 Disease of biliary tract, unspecified: Secondary | ICD-10-CM | POA: Insufficient documentation
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
## 2020-10-07 ENCOUNTER — Other Ambulatory Visit: Payer: Self-pay

## 2020-10-07 ENCOUNTER — Encounter (HOSPITAL_COMMUNITY): Payer: Self-pay | Admitting: *Deleted

## 2020-10-07 ENCOUNTER — Inpatient Hospital Stay (HOSPITAL_COMMUNITY)
Admission: EM | Admit: 2020-10-07 | Discharge: 2020-10-11 | DRG: 683 | Disposition: A | Discharge status: Home / Self Care | Payer: Medicaid Other | Attending: Surgery | Admitting: Surgery

## 2020-10-07 ENCOUNTER — Emergency Department (HOSPITAL_COMMUNITY): Payer: Medicaid Other

## 2020-10-07 DIAGNOSIS — R112 Nausea with vomiting, unspecified: Secondary | ICD-10-CM

## 2020-10-07 DIAGNOSIS — R1084 Generalized abdominal pain: Secondary | ICD-10-CM

## 2020-10-07 DIAGNOSIS — F17210 Nicotine dependence, cigarettes, uncomplicated: Secondary | ICD-10-CM | POA: Diagnosis present

## 2020-10-07 DIAGNOSIS — R140 Abdominal distension (gaseous): Secondary | ICD-10-CM

## 2020-10-07 DIAGNOSIS — Z20822 Contact with and (suspected) exposure to covid-19: Secondary | ICD-10-CM | POA: Diagnosis present

## 2020-10-07 DIAGNOSIS — E871 Hypo-osmolality and hyponatremia: Secondary | ICD-10-CM | POA: Diagnosis present

## 2020-10-07 DIAGNOSIS — N179 Acute kidney failure, unspecified: Principal | ICD-10-CM

## 2020-10-07 DIAGNOSIS — R52 Pain, unspecified: Secondary | ICD-10-CM

## 2020-10-07 DIAGNOSIS — Z933 Colostomy status: Secondary | ICD-10-CM

## 2020-10-07 DIAGNOSIS — Z79899 Other long term (current) drug therapy: Secondary | ICD-10-CM

## 2020-10-07 DIAGNOSIS — R7401 Elevation of levels of liver transaminase levels: Secondary | ICD-10-CM

## 2020-10-07 DIAGNOSIS — F17290 Nicotine dependence, other tobacco product, uncomplicated: Secondary | ICD-10-CM | POA: Diagnosis present

## 2020-10-07 DIAGNOSIS — E860 Dehydration: Secondary | ICD-10-CM

## 2020-10-07 DIAGNOSIS — Z931 Gastrostomy status: Secondary | ICD-10-CM

## 2020-10-07 LAB — COMPREHENSIVE METABOLIC PANEL
ALT: 538 U/L — ABNORMAL HIGH (ref 0–44)
AST: 140 U/L — ABNORMAL HIGH (ref 15–41)
Albumin: 4.9 g/dL (ref 3.5–5.0)
Alkaline Phosphatase: 168 U/L — ABNORMAL HIGH (ref 38–126)
Anion gap: 17 — ABNORMAL HIGH (ref 5–15)
BUN: 38 mg/dL — ABNORMAL HIGH (ref 6–20)
CO2: 17 mmol/L — ABNORMAL LOW (ref 22–32)
Calcium: 10.4 mg/dL — ABNORMAL HIGH (ref 8.9–10.3)
Chloride: 91 mmol/L — ABNORMAL LOW (ref 98–111)
Creatinine, Ser: 2.59 mg/dL — ABNORMAL HIGH (ref 0.61–1.24)
GFR, Estimated: 32 mL/min — ABNORMAL LOW (ref >60)
Glucose, Bld: 123 mg/dL — ABNORMAL HIGH (ref 70–99)
Potassium: 3.8 mmol/L (ref 3.5–5.1)
Sodium: 125 mmol/L — ABNORMAL LOW (ref 135–145)
Total Bilirubin: 1.4 mg/dL — ABNORMAL HIGH (ref 0.3–1.2)
Total Protein: 9.7 g/dL — ABNORMAL HIGH (ref 6.5–8.1)

## 2020-10-07 LAB — CBC WITH DIFFERENTIAL/PLATELET
Abs Immature Granulocytes: 0.03 10*3/uL (ref 0.00–0.07)
Basophils Absolute: 0 10*3/uL (ref 0.0–0.1)
Basophils Relative: 0 %
Eosinophils Absolute: 0 10*3/uL (ref 0.0–0.5)
Eosinophils Relative: 1 %
HCT: 46.3 % (ref 39.0–52.0)
Hemoglobin: 15.4 g/dL (ref 13.0–17.0)
Immature Granulocytes: 0 %
Lymphocytes Relative: 21 %
Lymphs Abs: 1.9 10*3/uL (ref 0.7–4.0)
MCH: 26.6 pg (ref 26.0–34.0)
MCHC: 33.3 g/dL (ref 30.0–36.0)
MCV: 79.8 fL — ABNORMAL LOW (ref 80.0–100.0)
Monocytes Absolute: 0.7 10*3/uL (ref 0.1–1.0)
Monocytes Relative: 8 %
Neutro Abs: 6.1 10*3/uL (ref 1.7–7.7)
Neutrophils Relative %: 70 %
Platelets: 445 10*3/uL — ABNORMAL HIGH (ref 150–400)
RBC: 5.8 MIL/uL (ref 4.22–5.81)
RDW: 14.1 % (ref 11.5–15.5)
WBC: 8.7 10*3/uL (ref 4.0–10.5)
nRBC: 0 % (ref 0.0–0.2)

## 2020-10-07 LAB — URINALYSIS, ROUTINE W REFLEX MICROSCOPIC
Bilirubin Urine: NEGATIVE
Glucose, UA: NEGATIVE mg/dL
Hgb urine dipstick: NEGATIVE
Ketones, ur: 5 mg/dL — AB
Leukocytes,Ua: NEGATIVE
Nitrite: NEGATIVE
Protein, ur: 100 mg/dL — AB
Specific Gravity, Urine: 1.029 (ref 1.005–1.030)
pH: 5 (ref 5.0–8.0)

## 2020-10-07 LAB — APTT: aPTT: 32 seconds (ref 24–36)

## 2020-10-07 LAB — RESP PANEL BY RT-PCR (FLU A&B, COVID) ARPGX2
Influenza A by PCR: NEGATIVE
Influenza B by PCR: NEGATIVE
SARS Coronavirus 2 by RT PCR: NEGATIVE

## 2020-10-07 LAB — PROTIME-INR
INR: 1.1 (ref 0.8–1.2)
Prothrombin Time: 14.5 seconds (ref 11.4–15.2)

## 2020-10-07 LAB — LACTIC ACID, PLASMA: Lactic Acid, Venous: 1.4 mmol/L (ref 0.5–1.9)

## 2020-10-07 MED ORDER — THIAMINE HCL 100 MG/ML IJ SOLN
100.0000 mg | Freq: Every day | INTRAMUSCULAR | Status: DC
  Administered 2020-10-08 – 2020-10-10 (×3): 100 mg via INTRAVENOUS
  Filled 2020-10-07 (×4): qty 2

## 2020-10-07 MED ORDER — ENOXAPARIN SODIUM 40 MG/0.4ML IJ SOSY
40.0000 mg | PREFILLED_SYRINGE | Freq: Every day | INTRAMUSCULAR | Status: DC
  Administered 2020-10-08: 40 mg via SUBCUTANEOUS
  Filled 2020-10-07 (×3): qty 0.4

## 2020-10-07 MED ORDER — LACTATED RINGERS IV BOLUS (SEPSIS)
1000.0000 mL | Freq: Once | INTRAVENOUS | Status: AC
  Administered 2020-10-07: 1000 mL via INTRAVENOUS

## 2020-10-07 MED ORDER — SODIUM CHLORIDE 0.9 % IV SOLN
2.0000 g | Freq: Once | INTRAVENOUS | Status: AC
  Administered 2020-10-07: 2 g via INTRAVENOUS
  Filled 2020-10-07: qty 2

## 2020-10-07 MED ORDER — ONDANSETRON 4 MG PO TBDP: 4.0000 mg | ORAL_TABLET | Freq: Four times a day (QID) | ORAL | Status: DC | PRN

## 2020-10-07 MED ORDER — ONDANSETRON HCL 4 MG/2ML IJ SOLN
4.0000 mg | Freq: Four times a day (QID) | INTRAMUSCULAR | Status: DC | PRN
  Administered 2020-10-08: 4 mg via INTRAVENOUS
  Filled 2020-10-07: qty 2

## 2020-10-07 MED ORDER — SODIUM CHLORIDE 0.9 % IV SOLN: INTRAVENOUS | Status: DC

## 2020-10-07 MED ORDER — IOHEXOL 9 MG/ML PO SOLN
ORAL | Status: AC
  Filled 2020-10-07: qty 1000

## 2020-10-07 MED ORDER — SODIUM CHLORIDE 0.9 % IV BOLUS
1000.0000 mL | Freq: Once | INTRAVENOUS | Status: AC
  Administered 2020-10-07: 1000 mL via INTRAVENOUS

## 2020-10-07 MED ORDER — PANTOPRAZOLE SODIUM 40 MG IV SOLR
40.0000 mg | Freq: Every day | INTRAVENOUS | Status: DC
  Administered 2020-10-08 – 2020-10-10 (×3): 40 mg via INTRAVENOUS
  Filled 2020-10-07 (×4): qty 40

## 2020-10-07 MED ORDER — LACTATED RINGERS IV SOLN
INTRAVENOUS | Status: DC
Start: 2020-10-07 — End: 2020-10-07

## 2020-10-07 MED ORDER — ONDANSETRON HCL 4 MG/2ML IJ SOLN
4.0000 mg | Freq: Once | INTRAMUSCULAR | Status: AC
  Administered 2020-10-07: 4 mg via INTRAVENOUS
  Filled 2020-10-07: qty 2

## 2020-10-07 MED ORDER — SODIUM CHLORIDE 0.9 % IV SOLN: 2.0000 g | Freq: Two times a day (BID) | INTRAVENOUS | Status: DC

## 2020-10-07 NOTE — ED Notes (Signed)
Cousin April Mckinnon [phone] would like an update
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
## 2020-10-07 NOTE — Progress Notes (Signed)
Pt being followed by [facility] for Sepsis protocol. 

## 2020-10-07 NOTE — ED Notes (Signed)
Warm blanket provided upon request. Patient has a male visitor at the bedside. Lights off per pt. Request. Call bell on stretcher with patient.

## 2020-10-07 NOTE — ED Notes (Signed)
The pt has an ileostomy
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
## 2020-10-07 NOTE — Progress Notes (Signed)
Pharmacy Antibiotic Note  Alejandro Shelton is a 35 y.o. male admitted on 10/07/2020 with sepsis, hx urostomy/colostomy.  Pharmacy has been consulted for cefepime dosing.  Plan: Cefepime 2g IV every 12 hours Monitor renal function, Cx and clinical progression to narrow  Height: 5\' 11"  (180.3 cm) Weight: 74.8 kg (165 lb) IBW/kg (Calculated) : 75.3  Temp (24hrs), Avg:97.9 F (36.6 C), Min:97.9 F (36.6 C), Max:97.9 F (36.6 C)  No results for input(s): WBC, CREATININE, LATICACIDVEN, VANCOTROUGH, VANCOPEAK, VANCORANDOM, GENTTROUGH, GENTPEAK, GENTRANDOM, TOBRATROUGH, TOBRAPEAK, TOBRARND, AMIKACINPEAK, AMIKACINTROU, AMIKACIN in the last 168 hours.  CrCl cannot be calculated (Patient's most recent lab result is older than the maximum 21 days allowed.).    Allergies  Allergen Reactions   Aspirin Swelling    , PharmD Clinical Pharmacist ED Pharmacist Phone # [phone] 10/07/2020 8:45 PM
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
## 2020-10-08 ENCOUNTER — Inpatient Hospital Stay (HOSPITAL_COMMUNITY): Payer: Medicaid Other

## 2020-10-08 LAB — COMPREHENSIVE METABOLIC PANEL
ALT: 352 U/L — ABNORMAL HIGH (ref 0–44)
AST: 88 U/L — ABNORMAL HIGH (ref 15–41)
Albumin: 3.5 g/dL (ref 3.5–5.0)
Alkaline Phosphatase: 114 U/L (ref 38–126)
Anion gap: 9 (ref 5–15)
BUN: 29 mg/dL — ABNORMAL HIGH (ref 6–20)
CO2: 21 mmol/L — ABNORMAL LOW (ref 22–32)
Calcium: 9 mg/dL (ref 8.9–10.3)
Chloride: 100 mmol/L (ref 98–111)
Creatinine, Ser: 1.62 mg/dL — ABNORMAL HIGH (ref 0.61–1.24)
GFR, Estimated: 56 mL/min — ABNORMAL LOW (ref >60)
Glucose, Bld: 99 mg/dL (ref 70–99)
Potassium: 3.9 mmol/L (ref 3.5–5.1)
Sodium: 130 mmol/L — ABNORMAL LOW (ref 135–145)
Total Bilirubin: 1.4 mg/dL — ABNORMAL HIGH (ref 0.3–1.2)
Total Protein: 6.9 g/dL (ref 6.5–8.1)

## 2020-10-08 LAB — CBC
HCT: 37.8 % — ABNORMAL LOW (ref 39.0–52.0)
Hemoglobin: 12.4 g/dL — ABNORMAL LOW (ref 13.0–17.0)
MCH: 26.7 pg (ref 26.0–34.0)
MCHC: 32.8 g/dL (ref 30.0–36.0)
MCV: 81.3 fL (ref 80.0–100.0)
Platelets: 301 10*3/uL (ref 150–400)
RBC: 4.65 MIL/uL (ref 4.22–5.81)
RDW: 14.2 % (ref 11.5–15.5)
WBC: 10.9 10*3/uL — ABNORMAL HIGH (ref 4.0–10.5)
nRBC: 0 % (ref 0.0–0.2)

## 2020-10-08 LAB — MAGNESIUM: Magnesium: 2 mg/dL (ref 1.7–2.4)

## 2020-10-08 LAB — LACTIC ACID, PLASMA: Lactic Acid, Venous: 1.7 mmol/L (ref 0.5–1.9)

## 2020-10-08 LAB — LIPASE, BLOOD: Lipase: 60 U/L — ABNORMAL HIGH (ref 11–51)

## 2020-10-08 MED ORDER — HYDROMORPHONE HCL 1 MG/ML IJ SOLN
0.5000 mg | INTRAMUSCULAR | Status: DC | PRN
  Administered 2020-10-08 – 2020-10-11 (×19): 0.5 mg via INTRAVENOUS
  Filled 2020-10-08 (×11): qty 0.5
  Filled 2020-10-08: qty 1
  Filled 2020-10-08 (×7): qty 0.5

## 2020-10-08 NOTE — Plan of Care (Signed)
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
## 2020-10-09 ENCOUNTER — Inpatient Hospital Stay (HOSPITAL_COMMUNITY): Payer: Medicaid Other

## 2020-10-09 LAB — CBC
HCT: 38.5 % — ABNORMAL LOW (ref 39.0–52.0)
Hemoglobin: 12.9 g/dL — ABNORMAL LOW (ref 13.0–17.0)
MCH: 27.6 pg (ref 26.0–34.0)
MCHC: 33.5 g/dL (ref 30.0–36.0)
MCV: 82.4 fL (ref 80.0–100.0)
Platelets: 307 10*3/uL (ref 150–400)
RBC: 4.67 MIL/uL (ref 4.22–5.81)
RDW: 14.2 % (ref 11.5–15.5)
WBC: 7.7 10*3/uL (ref 4.0–10.5)
nRBC: 0 % (ref 0.0–0.2)

## 2020-10-09 LAB — BASIC METABOLIC PANEL
Anion gap: 8 (ref 5–15)
BUN: 15 mg/dL (ref 6–20)
CO2: 21 mmol/L — ABNORMAL LOW (ref 22–32)
Calcium: 9.2 mg/dL (ref 8.9–10.3)
Chloride: 100 mmol/L (ref 98–111)
Creatinine, Ser: 1.12 mg/dL (ref 0.61–1.24)
GFR, Estimated: >60 mL/min (ref >60)
Glucose, Bld: 92 mg/dL (ref 70–99)
Potassium: 4.1 mmol/L (ref 3.5–5.1)
Sodium: 129 mmol/L — ABNORMAL LOW (ref 135–145)

## 2020-10-09 LAB — URINE CULTURE: Culture: <10000 — AB

## 2020-10-09 LAB — MAGNESIUM: Magnesium: 2 mg/dL (ref 1.7–2.4)

## 2020-10-09 LAB — PHOSPHORUS: Phosphorus: 2.5 mg/dL (ref 2.5–4.6)

## 2020-10-09 MED ORDER — SIMETHICONE 80 MG PO CHEW
80.0000 mg | CHEWABLE_TABLET | Freq: Four times a day (QID) | ORAL | Status: DC | PRN
  Administered 2020-10-09 – 2020-10-10 (×4): 80 mg via ORAL
  Filled 2020-10-09 (×5): qty 1

## 2020-10-09 NOTE — Plan of Care (Signed)
  Problem: Activity: Goal: Risk for activity intolerance will decrease Outcome: Progressing   Problem: Nutrition: Goal: Adequate nutrition will be maintained Outcome: Progressing   Problem: Pain Managment: Goal: General experience of comfort will improve Outcome: Progressing   
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
## 2020-10-09 NOTE — Progress Notes (Signed)
Bedside shift report complete. Received patient awake,alert/orientedx4 and able to verbalize needs. NAD noted; respirations easy/even on room air.G tube noted to abd; clamped at this time. Colostomy noted; bag changed; c/d/I. D-tube noted with minimal output in collection bag at this time. Movement/sensation to all extremities noted. IVF infusing at this time. Whiteboard updated. All safety measures in place and personal belongings within reach.

## 2020-10-09 NOTE — Plan of Care (Signed)
  Problem: Education: Goal: Knowledge of General Education information will improve Description: Including pain rating scale, medication(s)/side effects and non-pharmacologic comfort measures Outcome: Progressing   Problem: Clinical Measurements: Goal: Cardiovascular complication will be avoided Outcome: Progressing   Problem: Activity: Goal: Risk for activity intolerance will decrease Outcome: Progressing   Problem: Pain Managment: Goal: General experience of comfort will improve Outcome: Progressing   Problem: Safety: Goal: Ability to remain free from injury will improve Outcome: Progressing   

## 2020-10-10 LAB — BASIC METABOLIC PANEL
Anion gap: 11 (ref 5–15)
BUN: 9 mg/dL (ref 6–20)
CO2: 21 mmol/L — ABNORMAL LOW (ref 22–32)
Calcium: 9.2 mg/dL (ref 8.9–10.3)
Chloride: 101 mmol/L (ref 98–111)
Creatinine, Ser: 0.96 mg/dL (ref 0.61–1.24)
GFR, Estimated: >60 mL/min (ref >60)
Glucose, Bld: 89 mg/dL (ref 70–99)
Potassium: 3.6 mmol/L (ref 3.5–5.1)
Sodium: 133 mmol/L — ABNORMAL LOW (ref 135–145)

## 2020-10-10 MED ORDER — ENSURE ENLIVE PO LIQD
237.0000 mL | Freq: Three times a day (TID) | ORAL | Status: DC
  Administered 2020-10-10: 237 mL via ORAL

## 2020-10-10 MED ORDER — POTASSIUM CHLORIDE 20 MEQ PO PACK
40.0000 meq | PACK | Freq: Once | ORAL | Status: AC
  Administered 2020-10-10: 40 meq via ORAL
  Filled 2020-10-10: qty 2
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
## 2020-10-11 MED ORDER — ACETAMINOPHEN 500 MG PO TABS: 500.0000 mg | ORAL_TABLET | Freq: Four times a day (QID) | ORAL | Status: DC | PRN
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
## 2020-10-11 NOTE — Progress Notes (Signed)
Blood pressure 132/86, pulse 82, temperature 98.2 F (36.8 C), temperature source Oral, resp. rate 16, height 5\' 11"  (1.803 m), weight 74.8 kg, SpO2 98 %. Alert and oriented x4 Pt Discharge instructions given. Pt opted to walk to his car.

## 2020-10-12 LAB — CULTURE, BLOOD (ROUTINE X 2)
Culture: NO GROWTH
Culture: NO GROWTH
Special Requests: ADEQUATE

## 2021-02-09 ENCOUNTER — Encounter: Payer: Self-pay | Admitting: Student

## 2021-02-09 ENCOUNTER — Ambulatory Visit: Payer: Self-pay | Admitting: Student

## 2021-02-09 ENCOUNTER — Other Ambulatory Visit: Payer: Self-pay

## 2021-02-09 VITALS — BP 140/89 | HR 85 | Temp 98.4°F | Ht 71.0 in | Wt 180.8 lb

## 2021-02-09 DIAGNOSIS — Z1159 Encounter for screening for other viral diseases: Secondary | ICD-10-CM

## 2021-02-09 DIAGNOSIS — Z0000 Encounter for general adult medical examination without abnormal findings: Secondary | ICD-10-CM

## 2021-02-09 DIAGNOSIS — E871 Hypo-osmolality and hyponatremia: Secondary | ICD-10-CM

## 2021-02-09 DIAGNOSIS — W3400XA Accidental discharge from unspecified firearms or gun, initial encounter: Secondary | ICD-10-CM

## 2021-02-09 DIAGNOSIS — Z933 Colostomy status: Secondary | ICD-10-CM
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
## 2021-02-10 LAB — BMP8+ANION GAP
Anion Gap: 14 mmol/L (ref 10.0–18.0)
BUN/Creatinine Ratio: 10 (ref 9–20)
BUN: 11 mg/dL (ref 6–20)
CO2: 23 mmol/L (ref 20–29)
Calcium: 9.6 mg/dL (ref 8.7–10.2)
Chloride: 101 mmol/L (ref 96–106)
Creatinine, Ser: 1.1 mg/dL (ref 0.76–1.27)
Glucose: 94 mg/dL (ref 70–99)
Potassium: 4.2 mmol/L (ref 3.5–5.2)
Sodium: 138 mmol/L (ref 134–144)
eGFR: 89 mL/min/{1.73_m2} (ref >59)

## 2021-02-10 LAB — HEPATITIS C ANTIBODY: Hep C Virus Ab: <0.1 s/co ratio (ref 0.0–0.9)

## 2021-02-11 ENCOUNTER — Encounter: Payer: Self-pay | Admitting: Student

## 2021-02-11 DIAGNOSIS — Z0000 Encounter for general adult medical examination without abnormal findings: Secondary | ICD-10-CM | POA: Insufficient documentation

## 2021-02-11 NOTE — Assessment & Plan Note (Signed)
Patient follows [facility]. On his last visit in August, I removed his duodenostomy tube was removed in August. Right-sided colostomy in place with plan for takedown once patient has Medicaid or other coverage. -- Medicaid pending, reached out to [facility], CSW, pending response -- Continue colostomy bag care -- Follow-up with [facility] surgery
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
## 2021-02-11 NOTE — Assessment & Plan Note (Signed)
Hepatitis C screening negative.

## 2021-02-11 NOTE — Assessment & Plan Note (Signed)
Patient has a history of hyponatremia with sodium as low as 125 during hospitalization 4 months ago. Sodium improved to 133 at discharge. BMP today shows normal sodium levels of 138. Kidney function remains normal. We will continue to monitor electrolytes closely in the setting of patient's end colostomy.  -- Hyponatremia resolved

## 2021-02-16 ENCOUNTER — Encounter: Payer: Medicaid Other | Admitting: Student

## 2021-02-23 ENCOUNTER — Encounter: Payer: Medicaid Other | Admitting: Internal Medicine
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
## 2021-03-01 ENCOUNTER — Encounter: Payer: Medicaid Other | Admitting: Internal Medicine

## 2021-03-06 ENCOUNTER — Encounter: Payer: Medicaid Other | Admitting: Internal Medicine

## 2021-03-11 NOTE — Progress Notes (Signed)
Internal Medicine Clinic Attending  Case discussed with Dr. A[place]onsah  At the time of the visit.  We reviewed the resident's history and exam and pertinent patient test results.  I agree with the assessment, diagnosis, and plan of care documented in the resident's note.  

## 2021-03-25 ENCOUNTER — Other Ambulatory Visit: Payer: Self-pay

## 2021-03-25 ENCOUNTER — Encounter (HOSPITAL_COMMUNITY): Payer: Self-pay | Admitting: Emergency Medicine

## 2021-03-25 ENCOUNTER — Emergency Department (HOSPITAL_COMMUNITY)
Admission: EM | Admit: 2021-03-25 | Discharge: 2021-03-25 | Disposition: A | Discharge status: Home / Self Care | Payer: Medicaid Other | Attending: Emergency Medicine | Admitting: Emergency Medicine

## 2021-03-25 DIAGNOSIS — M25462 Effusion, left knee: Secondary | ICD-10-CM | POA: Insufficient documentation

## 2021-03-25 DIAGNOSIS — L03116 Cellulitis of left lower limb: Secondary | ICD-10-CM

## 2021-03-25 DIAGNOSIS — Z87891 Personal history of nicotine dependence: Secondary | ICD-10-CM | POA: Insufficient documentation

## 2021-03-25 MED ORDER — DOXYCYCLINE HYCLATE 100 MG PO CAPS: 100.0000 mg | ORAL_CAPSULE | Freq: Two times a day (BID) | ORAL | 0 refills | Status: DC

## 2021-03-25 NOTE — Discharge Instructions (Addendum)
I have sent antibiotic into your pharmacy.  You have a local skin infection.  With potential insect bite or abscess in the center.  If it is an abscess that is not ready to be drained at this time.  If the redness worsens significantly or you have drainage from the area, or you develop fever please return to the emergency room.  Otherwise you can follow-up with your primary care provider
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
## 2021-03-25 NOTE — ED Triage Notes (Signed)
C/o ? Spider bite to L lower leg x 2 days with pain, redness, and swelling.  Denies fever and chills.

## 2021-05-23 ENCOUNTER — Ambulatory Visit: Payer: Self-pay | Admitting: General Surgery
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
## 2021-06-18 ENCOUNTER — Encounter: Payer: Medicaid Other | Admitting: Student

## 2021-06-18 ENCOUNTER — Encounter: Payer: Self-pay | Admitting: Student

## 2021-07-01 ENCOUNTER — Encounter (HOSPITAL_COMMUNITY): Payer: Self-pay | Admitting: General Surgery

## 2021-07-03 NOTE — Progress Notes (Signed)
Surgical Instructions ? ? ? Your procedure is scheduled on 07/10/21. ? Report to [facility] Main Entrance "A" at 5:30 A.M., then check in with the Admitting office. ? Call this number if you have problems the morning of surgery: ? [phone] ? ? If you have any questions prior to your surgery date call [phone]: Open Monday-Friday 8am-4pm ? ? ? Remember: ? Do not eat after midnight the night before your surgery ? ?You may drink clear liquids until 4:30am the morning of your surgery.   ?Clear liquids allowed are: Water, Non-Citrus Juices (without pulp), Carbonated Beverages, Clear Tea, Black Coffee ONLY (NO MILK, CREAM OR POWDERED CREAMER of any kind), and Gatorade ? ?Patient Instructions ? ?The night before surgery:  ?No food after midnight. ONLY clear liquids after midnight ? ?The day of surgery (if you do NOT have diabetes):  ?Drink TWO (2) Pre-Surgery Clear Ensure the night before surgery (after dinner and before bedtime). Drink ONE (1) Pre-Surgery Clear Ensure by 4:30am the morning of surgery. Drink in one sitting. Do not sip.  ?This drink was given to you during your hospital  ?pre-op appointment visit. ?Nothing else to drink after completing the  ?Pre-Surgery Clear Ensure. ? ? ?       If you have questions, please contact your surgeon?s office. ? ?  ? Take these medicines the morning of surgery with A SIP OF WATER:  ?traMADol (ULTRAM) if needed ? ?As of today, STOP taking any Aspirin (unless otherwise instructed by your surgeon) Aleve, Naproxen, Ibuprofen, Motrin, Advil, Goody's, BC's, all herbal medications, fish oil, and all vitamins. ? ?         ?Do not wear jewelry or makeup ?Do not wear lotions, powders, perfumes/colognes, or deodorant. ?Men may shave face and neck. ?Do not bring valuables to the hospital. ?Do not wear nail polish, gel polish, artificial nails, or any other type of covering on natural nails (fingers and toes) ?If you have artificial nails or gel coating that need to be removed by a  nail salon, please have this removed prior to surgery. Artificial nails or gel coating may interfere with anesthesia's ability to adequately monitor your vital signs. ? ? is not responsible for any belongings or valuables. .  ? ?Do NOT Smoke (Tobacco/Vaping)  24 hours prior to your procedure ? ?If you use a CPAP at night, you may bring your mask for your overnight stay. ?  ?Contacts, glasses, hearing aids, dentures or partials may not be worn into surgery, please bring cases for these belongings ?  ?For patients admitted to the hospital, discharge time will be determined by your treatment team. ?  ?Patients discharged the day of surgery will not be allowed to drive home, and someone needs to stay with them for 24 hours. ? ?NO VISITORS WILL BE ALLOWED IN PRE-OP WHERE PATIENTS ARE PREPPED FOR SURGERY.  ONLY 1 SUPPORT PERSON MAY BE PRESENT IN THE WAITING ROOM WHILE YOU ARE IN SURGERY.  IF YOU ARE TO BE ADMITTED, ONCE YOU ARE IN YOUR ROOM YOU WILL BE ALLOWED TWO (2) VISITORS. 1 (ONE) VISITOR MAY STAY OVERNIGHT BUT MUST ARRIVE TO THE ROOM BY 8pm.  Minor children may have two parents present. Special consideration for safety and communication needs will be reviewed on a case by case basis. ? ?Special instructions:   ? ?Oral Hygiene is also important to reduce your risk of infection.  Remember - BRUSH YOUR TEETH THE MORNING OF SURGERY WITH YOUR REGULAR TOOTHPASTE ? ? ?  Alejandro Shelton- Preparing For Surgery ? ?Before surgery, you can play an important role. Because skin is not sterile, your skin needs to be as free of germs as possible. You can reduce the number of germs on your skin by washing with CHG (chlorahexidine gluconate) Soap before surgery.  CHG is an antiseptic cleaner which kills germs and bonds with the skin to continue killing germs even after washing.   ? ? ?Please do not use if you have an allergy to CHG or antibacterial soaps. If your skin becomes reddened/irritated stop using the CHG.  ?Do not shave  (including legs and underarms) for at least 48 hours prior to first CHG shower. It is OK to shave your face. ? ?Please follow these instructions carefully. ?  ? ? Shower the NIGHT BEFORE SURGERY and the MORNING OF SURGERY with CHG Soap.  ? If you chose to wash your hair, wash your hair first as usual with your normal shampoo. After you shampoo, rinse your hair and body thoroughly to remove the shampoo.  Then [employer] and genitals (private parts) with your normal soap and rinse thoroughly to remove soap. ? ?After that Use CHG Soap as you would any other liquid soap. You can apply CHG directly to the skin and wash gently with a scrungie or a clean washcloth.  ? ?Apply the CHG Soap to your body ONLY FROM THE NECK DOWN.  Do not use on open wounds or open sores. Avoid contact with your eyes, ears, mouth and genitals (private parts). Wash Face and genitals (private parts)  with your normal soap.  ? ?Wash thoroughly, paying special attention to the area where your surgery will be performed. ? ?Thoroughly rinse your body with warm water from the neck down. ? ?DO NOT shower/wash with your normal soap after using and rinsing off the CHG Soap. ? ?Pat yourself dry with a CLEAN TOWEL. ? ?Wear CLEAN PAJAMAS to bed the night before surgery ? ?Place CLEAN SHEETS on your bed the night before your surgery ? ?DO NOT SLEEP WITH PETS. ? ? ?Day of Surgery: ?Take a shower with CHG soap. ?Wear Clean/Comfortable clothing the morning of surgery ?Do not apply any deodorants/lotions.   ?Remember to brush your teeth WITH YOUR REGULAR TOOTHPASTE. ? ? ? ?COVID testing ? ?If you are going to stay overnight or be admitted after your procedure/surgery and require a pre-op COVID test, please follow these instructions after your COVID test  ? ?You are not required to quarantine however you are required to wear a well-fitting mask when you are out and around people not in your household.  If your mask becomes wet or soiled, replace with a new  one. ? ?Wash your hands often with soap and water for 20 seconds or clean your hands with an alcohol-based hand sanitizer that contains at least 60% alcohol. ? ?Do not share personal items. ? ?Notify your provider: ?if you are in close contact with someone who has COVID  ?or if you develop a fever of 100.4 or greater, sneezing, cough, sore throat, shortness of breath or body aches. ? ?  ?Please read over the following fact sheets that you were given.  ? ?

## 2021-07-04 ENCOUNTER — Encounter (HOSPITAL_COMMUNITY): Payer: Self-pay

## 2021-07-04 ENCOUNTER — Encounter (HOSPITAL_COMMUNITY)
Admission: RE | Admit: 2021-07-04 | Discharge: 2021-07-04 | Disposition: A | Discharge status: Home / Self Care | Payer: Managed Care, Other (non HMO) | Source: Ambulatory Visit | Attending: General Surgery | Admitting: General Surgery

## 2021-07-04 ENCOUNTER — Other Ambulatory Visit: Payer: Self-pay

## 2021-07-04 VITALS — BP 144/79 | HR 97 | Temp 98.0°F | Resp 19 | Ht 71.0 in | Wt 173.4 lb

## 2021-07-04 DIAGNOSIS — Z20822 Contact with and (suspected) exposure to covid-19: Secondary | ICD-10-CM | POA: Diagnosis not present

## 2021-07-04 DIAGNOSIS — Z01812 Encounter for preprocedural laboratory examination: Secondary | ICD-10-CM | POA: Diagnosis present

## 2021-07-04 DIAGNOSIS — Z01818 Encounter for other preprocedural examination: Secondary | ICD-10-CM

## 2021-07-04 HISTORY — DX: Gastro-esophageal reflux disease without esophagitis: K21.9

## 2021-07-04 LAB — CBC
HCT: 40.6 % (ref 39.0–52.0)
Hemoglobin: 13 g/dL (ref 13.0–17.0)
MCH: 26.7 pg (ref 26.0–34.0)
MCHC: 32 g/dL (ref 30.0–36.0)
MCV: 83.5 fL (ref 80.0–100.0)
Platelets: 202 10*3/uL (ref 150–400)
RBC: 4.86 MIL/uL (ref 4.22–5.81)
RDW: 15.1 % (ref 11.5–15.5)
WBC: 5.8 10*3/uL (ref 4.0–10.5)
nRBC: 0 % (ref 0.0–0.2)

## 2021-07-04 NOTE — Progress Notes (Signed)
Spoke with Toniann Fail, RN at Dr Carollee Massed office to confirm that office reviewed ERAS Colon Bowel Prep.  Toniann Fail, RN verified that MD discussed bowel prep at his last appt.  Prescribed meds were called into [facility] at [facility] and [employer].  I will verify that patient has these instructions and if any questions to call the office for clarification. ?

## 2021-07-04 NOTE — Progress Notes (Signed)
DUE TO COVID-19 ONLY ONE VISITOR IS ALLOWED TO COME WITH YOU AND STAY IN THE WAITING ROOM ONLY DURING PRE OP AND PROCEDURE DAY OF SURGERY.  ? ?Two VISITORS MAY VISIT WITH YOU AFTER SURGERY IN YOUR PRIVATE ROOM DURING VISITING HOURS ONLY! ? ? ?PCP - none ?Cardiologist - n/a ? ?Chest x-ray - 10/07/20 (2V) ?EKG - 10/07/20 ?Stress Test - n/a ?ECHO - n/a ?Cardiac Cath - n/a ? ?ICD Pacemaker/Loop - n/a ? ?Sleep Study -  n/a ?CPAP - none ? ?ERAS: Clear liquids til 4:30 AM DOS. ? ?STOP now taking any Aspirin (unless otherwise instructed by your surgeon), Aleve, Naproxen, Ibuprofen, Motrin, Advil, Goody's, BC's, all herbal medications, fish oil, and all vitamins.  ? ?Coronavirus Screening ?Covid test is scheduled at PAT appt. ?Do you have any of the following symptoms:  ?Cough yes/no: No ?Fever (>100.52F)  yes/no: No ?Runny nose yes/no: No ?Sore throat yes/no: No ?Difficulty breathing/shortness of breath  yes/no: No ? ?Have you traveled in the last 14 days and where? yes/no: No ? ?Patient verbalized understanding of instructions that were given to them at the PAT appointment. Patient was also instructed that they will need to review over the PAT instructions again at home before surgery. ? ? ? ? ? ?

## 2021-07-05 LAB — SARS CORONAVIRUS 2 (TAT 6-24 HRS): SARS Coronavirus 2: NEGATIVE

## 2021-07-10 ENCOUNTER — Inpatient Hospital Stay (HOSPITAL_COMMUNITY)
Admission: RE | Admit: 2021-07-10 | Discharge: 2021-07-24 | DRG: 336 | Discharge status: Against Medical Advice | Payer: Commercial Managed Care - HMO | Attending: General Surgery | Admitting: General Surgery

## 2021-07-10 ENCOUNTER — Other Ambulatory Visit: Payer: Self-pay

## 2021-07-10 ENCOUNTER — Inpatient Hospital Stay (HOSPITAL_COMMUNITY): Payer: Commercial Managed Care - HMO | Admitting: Certified Registered Nurse Anesthetist

## 2021-07-10 ENCOUNTER — Encounter (HOSPITAL_COMMUNITY): Payer: Self-pay | Admitting: General Surgery

## 2021-07-10 ENCOUNTER — Encounter (HOSPITAL_COMMUNITY): Admission: RE | Discharge status: Against Medical Advice | Payer: Self-pay | Source: Home / Self Care

## 2021-07-10 DIAGNOSIS — Z9889 Other specified postprocedural states: Principal | ICD-10-CM

## 2021-07-10 DIAGNOSIS — K567 Ileus, unspecified: Secondary | ICD-10-CM | POA: Diagnosis not present

## 2021-07-10 DIAGNOSIS — Z87891 Personal history of nicotine dependence: Secondary | ICD-10-CM

## 2021-07-10 DIAGNOSIS — K660 Peritoneal adhesions (postprocedural) (postinfection): Secondary | ICD-10-CM | POA: Diagnosis present

## 2021-07-10 DIAGNOSIS — Z91014 Allergy to mammalian meats: Secondary | ICD-10-CM | POA: Diagnosis not present

## 2021-07-10 DIAGNOSIS — D62 Acute posthemorrhagic anemia: Secondary | ICD-10-CM | POA: Diagnosis present

## 2021-07-10 DIAGNOSIS — K9189 Other postprocedural complications and disorders of digestive system: Secondary | ICD-10-CM | POA: Diagnosis not present

## 2021-07-10 DIAGNOSIS — R0602 Shortness of breath: Secondary | ICD-10-CM | POA: Diagnosis not present

## 2021-07-10 DIAGNOSIS — N179 Acute kidney failure, unspecified: Secondary | ICD-10-CM | POA: Diagnosis present

## 2021-07-10 DIAGNOSIS — R0902 Hypoxemia: Secondary | ICD-10-CM | POA: Diagnosis not present

## 2021-07-10 DIAGNOSIS — D72829 Elevated white blood cell count, unspecified: Secondary | ICD-10-CM | POA: Diagnosis not present

## 2021-07-10 DIAGNOSIS — K921 Melena: Secondary | ICD-10-CM | POA: Diagnosis not present

## 2021-07-10 DIAGNOSIS — Z5329 Procedure and treatment not carried out because of patient's decision for other reasons: Secondary | ICD-10-CM | POA: Diagnosis present

## 2021-07-10 DIAGNOSIS — Z433 Encounter for attention to colostomy: Secondary | ICD-10-CM

## 2021-07-10 DIAGNOSIS — I10 Essential (primary) hypertension: Secondary | ICD-10-CM | POA: Diagnosis present

## 2021-07-10 DIAGNOSIS — K219 Gastro-esophageal reflux disease without esophagitis: Secondary | ICD-10-CM | POA: Diagnosis present

## 2021-07-10 DIAGNOSIS — M545 Low back pain, unspecified: Secondary | ICD-10-CM | POA: Diagnosis not present

## 2021-07-10 DIAGNOSIS — E440 Moderate protein-calorie malnutrition: Secondary | ICD-10-CM | POA: Insufficient documentation

## 2021-07-10 DIAGNOSIS — E875 Hyperkalemia: Secondary | ICD-10-CM | POA: Diagnosis present

## 2021-07-10 HISTORY — PX: COLOSTOMY REVERSAL: SHX5782

## 2021-07-10 LAB — GLUCOSE, CAPILLARY: Glucose-Capillary: 181 mg/dL — ABNORMAL HIGH (ref 70–99)

## 2021-07-10 SURGERY — COLOSTOMY REVERSAL
Anesthesia: General | Site: Abdomen

## 2021-07-10 MED ORDER — PROPOFOL 10 MG/ML IV BOLUS
INTRAVENOUS | Status: DC | PRN
  Administered 2021-07-10: 40 mg via INTRAVENOUS
  Administered 2021-07-10: 160 mg via INTRAVENOUS

## 2021-07-10 MED ORDER — ONDANSETRON HCL 4 MG/2ML IJ SOLN
4.0000 mg | Freq: Four times a day (QID) | INTRAMUSCULAR | Status: DC | PRN
  Administered 2021-07-11 – 2021-07-21 (×2): 4 mg via INTRAVENOUS
  Filled 2021-07-10 (×3): qty 2

## 2021-07-10 MED ORDER — FENTANYL CITRATE (PF) 100 MCG/2ML IJ SOLN
25.0000 ug | INTRAMUSCULAR | Status: DC | PRN
  Administered 2021-07-10 (×3): 50 ug via INTRAVENOUS

## 2021-07-10 MED ORDER — GABAPENTIN 300 MG PO CAPS
300.0000 mg | ORAL_CAPSULE | Freq: Two times a day (BID) | ORAL | Status: DC
  Administered 2021-07-10 (×2): 300 mg via ORAL
  Filled 2021-07-10 (×2): qty 1

## 2021-07-10 MED ORDER — DEXMEDETOMIDINE (PRECEDEX) IN NS 20 MCG/5ML (4 MCG/ML) IV SYRINGE
PREFILLED_SYRINGE | INTRAVENOUS | Status: DC | PRN
  Administered 2021-07-10: 12 ug via INTRAVENOUS
  Administered 2021-07-10: 8 ug via INTRAVENOUS

## 2021-07-10 MED ORDER — MIDAZOLAM HCL 5 MG/5ML IJ SOLN
INTRAMUSCULAR | Status: DC | PRN
  Administered 2021-07-10: 2 mg via INTRAVENOUS

## 2021-07-10 MED ORDER — DEXMEDETOMIDINE (PRECEDEX) IN NS 20 MCG/5ML (4 MCG/ML) IV SYRINGE
PREFILLED_SYRINGE | INTRAVENOUS | Status: AC
  Filled 2021-07-10: qty 5

## 2021-07-10 MED ORDER — FENTANYL CITRATE (PF) 100 MCG/2ML IJ SOLN
INTRAMUSCULAR | Status: AC
  Filled 2021-07-10: qty 2

## 2021-07-10 MED ORDER — GABAPENTIN 300 MG PO CAPS
300.0000 mg | ORAL_CAPSULE | ORAL | Status: AC
  Administered 2021-07-10: 300 mg via ORAL
  Filled 2021-07-10: qty 1

## 2021-07-10 MED ORDER — ACETAMINOPHEN 500 MG PO TABS
1000.0000 mg | ORAL_TABLET | ORAL | Status: AC
  Administered 2021-07-10: 1000 mg via ORAL
  Filled 2021-07-10: qty 2

## 2021-07-10 MED ORDER — CHLORHEXIDINE GLUCONATE 0.12 % MT SOLN
15.0000 mL | Freq: Once | OROMUCOSAL | Status: AC
  Administered 2021-07-10: 15 mL via OROMUCOSAL
  Filled 2021-07-10: qty 15

## 2021-07-10 MED ORDER — PROPOFOL 10 MG/ML IV BOLUS
INTRAVENOUS | Status: AC
  Filled 2021-07-10: qty 20

## 2021-07-10 MED ORDER — CHLORHEXIDINE GLUCONATE CLOTH 2 % EX PADS: 6.0000 | MEDICATED_PAD | Freq: Once | CUTANEOUS | Status: DC

## 2021-07-10 MED ORDER — ORAL CARE MOUTH RINSE: 15.0000 mL | Freq: Once | OROMUCOSAL | Status: AC

## 2021-07-10 MED ORDER — ENSURE PRE-SURGERY PO LIQD: 296.0000 mL | Freq: Once | ORAL | Status: DC

## 2021-07-10 MED ORDER — KETOROLAC TROMETHAMINE 30 MG/ML IJ SOLN
INTRAMUSCULAR | Status: DC | PRN
  Administered 2021-07-10: 30 mg via INTRAVENOUS

## 2021-07-10 MED ORDER — OXYCODONE HCL 5 MG PO TABS
5.0000 mg | ORAL_TABLET | ORAL | Status: DC | PRN
  Administered 2021-07-10 – 2021-07-16 (×15): 10 mg via ORAL
  Administered 2021-07-23 (×2): 5 mg via ORAL
  Administered 2021-07-23 – 2021-07-24 (×3): 10 mg via ORAL
  Filled 2021-07-10: qty 2
  Filled 2021-07-10: qty 1
  Filled 2021-07-10 (×12): qty 2
  Filled 2021-07-10: qty 1
  Filled 2021-07-10 (×8): qty 2

## 2021-07-10 MED ORDER — MIDAZOLAM HCL 2 MG/2ML IJ SOLN
INTRAMUSCULAR | Status: AC
  Filled 2021-07-10: qty 2

## 2021-07-10 MED ORDER — METOPROLOL TARTRATE 5 MG/5ML IV SOLN
5.0000 mg | Freq: Four times a day (QID) | INTRAVENOUS | Status: DC | PRN
  Administered 2021-07-11: 5 mg via INTRAVENOUS
  Filled 2021-07-10: qty 5

## 2021-07-10 MED ORDER — ONDANSETRON HCL 4 MG/2ML IJ SOLN: 4.0000 mg | Freq: Four times a day (QID) | INTRAMUSCULAR | Status: DC | PRN

## 2021-07-10 MED ORDER — LIDOCAINE 2% (20 MG/ML) 5 ML SYRINGE
INTRAMUSCULAR | Status: AC
  Filled 2021-07-10: qty 5

## 2021-07-10 MED ORDER — ENOXAPARIN SODIUM 40 MG/0.4ML IJ SOSY
40.0000 mg | PREFILLED_SYRINGE | INTRAMUSCULAR | Status: DC
  Administered 2021-07-11 – 2021-07-12 (×2): 40 mg via SUBCUTANEOUS
  Filled 2021-07-10 (×2): qty 0.4

## 2021-07-10 MED ORDER — FENTANYL CITRATE (PF) 250 MCG/5ML IJ SOLN
INTRAMUSCULAR | Status: AC
  Filled 2021-07-10: qty 5

## 2021-07-10 MED ORDER — HYDROMORPHONE HCL 1 MG/ML IJ SOLN
INTRAMUSCULAR | Status: AC
  Filled 2021-07-10: qty 1

## 2021-07-10 MED ORDER — OXYCODONE HCL 5 MG PO TABS: 5.0000 mg | ORAL_TABLET | Freq: Once | ORAL | Status: DC | PRN

## 2021-07-10 MED ORDER — HYDROMORPHONE HCL 1 MG/ML IJ SOLN
0.2500 mg | INTRAMUSCULAR | Status: DC | PRN
  Administered 2021-07-10: 0.5 mg via INTRAVENOUS

## 2021-07-10 MED ORDER — OXYCODONE HCL 5 MG/5ML PO SOLN: 5.0000 mg | Freq: Once | ORAL | Status: DC | PRN

## 2021-07-10 MED ORDER — 0.9 % SODIUM CHLORIDE (POUR BTL) OPTIME
TOPICAL | Status: DC | PRN
  Administered 2021-07-10: 5000 mL

## 2021-07-10 MED ORDER — FENTANYL CITRATE (PF) 250 MCG/5ML IJ SOLN
INTRAMUSCULAR | Status: DC | PRN
  Administered 2021-07-10 (×2): 100 ug via INTRAVENOUS
  Administered 2021-07-10: 50 ug via INTRAVENOUS

## 2021-07-10 MED ORDER — POTASSIUM CHLORIDE IN NACL 20-0.45 MEQ/L-% IV SOLN
INTRAVENOUS | Status: DC
  Filled 2021-07-10 (×3): qty 1000

## 2021-07-10 MED ORDER — SODIUM CHLORIDE 0.9 % IV SOLN
2.0000 g | INTRAVENOUS | Status: AC
  Administered 2021-07-10: 2 g via INTRAVENOUS
  Filled 2021-07-10: qty 2

## 2021-07-10 MED ORDER — DEXAMETHASONE SODIUM PHOSPHATE 10 MG/ML IJ SOLN
INTRAMUSCULAR | Status: DC | PRN
  Administered 2021-07-10: 10 mg via INTRAVENOUS

## 2021-07-10 MED ORDER — ONDANSETRON HCL 4 MG/2ML IJ SOLN
INTRAMUSCULAR | Status: DC | PRN
  Administered 2021-07-10: 4 mg via INTRAVENOUS

## 2021-07-10 MED ORDER — HYDROMORPHONE HCL 1 MG/ML IJ SOLN
1.0000 mg | INTRAMUSCULAR | Status: DC | PRN
  Administered 2021-07-10: 2 mg via INTRAVENOUS
  Administered 2021-07-10: 1 mg via INTRAVENOUS
  Administered 2021-07-10: 2 mg via INTRAVENOUS
  Administered 2021-07-10 (×2): 1 mg via INTRAVENOUS
  Administered 2021-07-11 (×2): 2 mg via INTRAVENOUS
  Filled 2021-07-10: qty 2
  Filled 2021-07-10 (×2): qty 1
  Filled 2021-07-10 (×2): qty 2
  Filled 2021-07-10: qty 1
  Filled 2021-07-10: qty 2

## 2021-07-10 MED ORDER — LACTATED RINGERS IV SOLN
INTRAVENOUS | Status: DC
Start: 2021-07-10 — End: 2021-07-10

## 2021-07-10 MED ORDER — SUGAMMADEX SODIUM 200 MG/2ML IV SOLN
INTRAVENOUS | Status: DC | PRN
Start: 2021-07-10 — End: 2021-07-10
  Administered 2021-07-10: 200 mg via INTRAVENOUS

## 2021-07-10 MED ORDER — PANTOPRAZOLE SODIUM 40 MG IV SOLR
40.0000 mg | Freq: Every day | INTRAVENOUS | Status: DC
  Administered 2021-07-10 – 2021-07-23 (×14): 40 mg via INTRAVENOUS
  Filled 2021-07-10 (×15): qty 10

## 2021-07-10 MED ORDER — ROCURONIUM BROMIDE 10 MG/ML (PF) SYRINGE
PREFILLED_SYRINGE | INTRAVENOUS | Status: DC | PRN
Start: 2021-07-10 — End: 2021-07-10
  Administered 2021-07-10: 20 mg via INTRAVENOUS
  Administered 2021-07-10: 60 mg via INTRAVENOUS
  Administered 2021-07-10: 20 mg via INTRAVENOUS

## 2021-07-10 MED ORDER — DIPHENHYDRAMINE HCL 25 MG PO CAPS: 25.0000 mg | ORAL_CAPSULE | Freq: Four times a day (QID) | ORAL | Status: DC | PRN

## 2021-07-10 MED ORDER — ROCURONIUM BROMIDE 10 MG/ML (PF) SYRINGE
PREFILLED_SYRINGE | INTRAVENOUS | Status: AC
  Filled 2021-07-10: qty 10

## 2021-07-10 MED ORDER — LIDOCAINE 2% (20 MG/ML) 5 ML SYRINGE
INTRAMUSCULAR | Status: DC | PRN
  Administered 2021-07-10: 60 mg via INTRAVENOUS

## 2021-07-10 MED ORDER — DIPHENHYDRAMINE HCL 50 MG/ML IJ SOLN
25.0000 mg | Freq: Four times a day (QID) | INTRAMUSCULAR | Status: DC | PRN
Start: 2021-07-10 — End: 2021-07-24

## 2021-07-10 MED ORDER — HYDRALAZINE HCL 20 MG/ML IJ SOLN
10.0000 mg | Freq: Four times a day (QID) | INTRAMUSCULAR | Status: DC | PRN
  Administered 2021-07-11: 10 mg via INTRAVENOUS
  Filled 2021-07-10: qty 1

## 2021-07-10 MED ORDER — ONDANSETRON 4 MG PO TBDP
4.0000 mg | ORAL_TABLET | Freq: Four times a day (QID) | ORAL | Status: DC | PRN
  Filled 2021-07-10: qty 1

## 2021-07-10 MED ORDER — METHOCARBAMOL 1000 MG/10ML IJ SOLN
1000.0000 mg | Freq: Three times a day (TID) | INTRAMUSCULAR | Status: DC
  Administered 2021-07-10 – 2021-07-16 (×17): 1000 mg via INTRAVENOUS
  Filled 2021-07-10: qty 1000
  Filled 2021-07-10 (×4): qty 10
  Filled 2021-07-10: qty 1000
  Filled 2021-07-10: qty 10
  Filled 2021-07-10 (×2): qty 1000
  Filled 2021-07-10: qty 10
  Filled 2021-07-10: qty 1000
  Filled 2021-07-10 (×3): qty 10
  Filled 2021-07-10 (×5): qty 1000
  Filled 2021-07-10: qty 10

## 2021-07-10 MED ORDER — ENSURE PRE-SURGERY PO LIQD: 592.0000 mL | Freq: Once | ORAL | Status: DC

## 2021-07-10 SURGICAL SUPPLY — 47 items
BAG COUNTER SPONGE SURGICOUNT (BAG) ×2 IMPLANT
BAG SPNG CNTER NS LX DISP (BAG) ×1
BLADE CLIPPER SURG (BLADE) ×1 IMPLANT
CANISTER SUCT 3000ML PPV (MISCELLANEOUS) ×2 IMPLANT
COVER SURGICAL LIGHT HANDLE (MISCELLANEOUS) ×4 IMPLANT
DRSG OPSITE POSTOP 4X10 (GAUZE/BANDAGES/DRESSINGS) IMPLANT
DRSG OPSITE POSTOP 4X12 (GAUZE/BANDAGES/DRESSINGS) ×1 IMPLANT
DRSG OPSITE POSTOP 4X6 (GAUZE/BANDAGES/DRESSINGS) ×1 IMPLANT
DRSG OPSITE POSTOP 4X8 (GAUZE/BANDAGES/DRESSINGS) IMPLANT
ELECT CAUTERY BLADE 6.4 (BLADE) ×2 IMPLANT
ELECT REM PT RETURN 9FT ADLT (ELECTROSURGICAL) ×2
ELECTRODE REM PT RTRN 9FT ADLT (ELECTROSURGICAL) ×1 IMPLANT
GLOVE SRG 8 PF TXTR STRL LF DI (GLOVE) ×2 IMPLANT
GLOVE SURG ENC MOIS LTX SZ8 (GLOVE) ×5 IMPLANT
GLOVE SURG UNDER POLY LF SZ8 (GLOVE) ×4
GOWN STRL REUS W/ TWL LRG LVL3 (GOWN DISPOSABLE) ×4 IMPLANT
GOWN STRL REUS W/ TWL XL LVL3 (GOWN DISPOSABLE) ×2 IMPLANT
GOWN STRL REUS W/TWL LRG LVL3 (GOWN DISPOSABLE) ×4
GOWN STRL REUS W/TWL XL LVL3 (GOWN DISPOSABLE) ×8
KIT SIGMOIDOSCOPE (SET/KITS/TRAYS/PACK) IMPLANT
KIT TURNOVER KIT B (KITS) ×2 IMPLANT
LIGASURE IMPACT 36 18CM CVD LR (INSTRUMENTS) IMPLANT
NS IRRIG 1000ML POUR BTL (IV SOLUTION) ×7 IMPLANT
PACK COLON (CUSTOM PROCEDURE TRAY) ×2 IMPLANT
PAD ARMBOARD 7.5X6 YLW CONV (MISCELLANEOUS) ×3 IMPLANT
PENCIL SMOKE EVACUATOR (MISCELLANEOUS) ×2 IMPLANT
RELOAD PROXIMATE 75MM BLUE (ENDOMECHANICALS) ×2 IMPLANT
RELOAD STAPLE 75 3.8 BLU REG (ENDOMECHANICALS) IMPLANT
SPECIMEN JAR MEDIUM (MISCELLANEOUS) IMPLANT
SPONGE T-LAP 18X18 ~~LOC~~+RFID (SPONGE) ×3 IMPLANT
STAPLER GUN LINEAR PROX 60 (STAPLE) ×1 IMPLANT
STAPLER PROXIMATE 75MM BLUE (STAPLE) ×1 IMPLANT
STAPLER VISISTAT 35W (STAPLE) ×2 IMPLANT
SURGILUBE 2OZ TUBE FLIPTOP (MISCELLANEOUS) IMPLANT
SUT PDS AB 1 TP1 54 (SUTURE) ×2 IMPLANT
SUT PDS AB 1 TP1 96 (SUTURE) ×2 IMPLANT
SUT PROLENE 2 0 CT2 30 (SUTURE) IMPLANT
SUT PROLENE 2 0 KS (SUTURE) IMPLANT
SUT SILK 2 0 SH CR/8 (SUTURE) ×2 IMPLANT
SUT SILK 2 0 TIES 10X30 (SUTURE) ×2 IMPLANT
SUT SILK 3 0 SH CR/8 (SUTURE) ×2 IMPLANT
SUT SILK 3 0 TIES 10X30 (SUTURE) ×2 IMPLANT
TRAY FOL W/BAG SLVR 16FR STRL (SET/KITS/TRAYS/PACK) IMPLANT
TRAY FOLEY MTR SLVR 14FR STAT (SET/KITS/TRAYS/PACK) ×1 IMPLANT
TRAY FOLEY W/BAG SLVR 16FR LF (SET/KITS/TRAYS/PACK) ×2
TUBE CONNECTING 12X1/4 (SUCTIONS) ×4 IMPLANT
UNDERPAD 30X36 HEAVY ABSORB (UNDERPADS AND DIAPERS) IMPLANT

## 2021-07-10 NOTE — Op Note (Signed)
?07/10/2021 ? ?9:47 AM ? ?PATIENT:  Alejandro Shelton  36 y.o. male ? ?PRE-OPERATIVE DIAGNOSIS:  COLOSTOMY IN PLACE ? ?POST-OPERATIVE DIAGNOSIS:  COLOSTOMY IN PLACE ? ?PROCEDURE:  Procedure(s): ?LYSIS OF ADHESIONS 30MIN ?COLOSTOMY TAKEDOWN ? ?SURGEON: Georganna Skeans, MD ? ?ASSISTANTS: Armandina Gemma, MD ? ?ANESTHESIA:   general ? ?EBL:  Total I/O ?In: 1100 [I.V.:1000; IV Piggyback:100] ?Out: 250 [Urine:200; Blood:50] ? ?BLOOD ADMINISTERED:none ? ?DRAINS: none  ? ?SPECIMEN:  Excision ? ?DISPOSITION OF SPECIMEN:  PATHOLOGY ? ?COUNTS:  YES ? ?DICTATION: .Dragon Dictation ?Procedure in detail: Informed consent was obtained.  He received intravenous antibiotics.  He was brought to the operating room and general endotracheal anesthesia was administered by the anesthesia staff.  Foley catheter was placed by nursing.  Timeout procedure was performed.  I sewed his colostomy closed with 2-0 silk.  His abdomen was prepped and draped in a sterile fashion.  We repeated the timeout procedure.  I did a midline incision incorporating his scar.  The scar was excised.  The fascia was divided along the midline and the peritoneal cavity was entered carefully under direct vision.  There were a lot of filmy adhesions.  We gradually swept these down and opened the fascia.  We were able to open the fascia for most of the incision gradually after the adhesiolysis.  There was some significant small bowel adhesions cephalad which we left in place because they were difficult to take down and they did not seem in the way.  Further adhesiolysis was performed for a total of 30 minutes.  We were able to free up the distal stump of the transverse colon.  Once that was totally freed up from the adhesions we dissected out the colostomy first from the inside and then we made an elliptical incision around the colostomy on the outside.  Subcutaneous tissues were dissected down and it was circumferentially freed from the musculature.  It was passed through  into the abdomen and further adhesions were taken down.  The end of the colostomy was divided with a GIA-75 stapler.  We then performed a side-to-side anastomosis of the right colon to the remaining transverse colon.  There was plenty of each and there was no tension.  We performed this anastomosis with a GIA 75 stapler.  The common defect was closed with a TA 60 after making sure there was no bleeding inside.  An apical suture of 2-0 silk was placed.  Some additional sutures were placed along the staple line to reinforce it and get good hemostasis.  The anastomosis was widely patent and viable.  The mesenteric defect was closed with interrupted 2-0 silk.  The abdomen was copiously irrigated and hemostasis was ensured.  We then changed our gowns, gloves, instruments, and drapes per the colon protocol.  The colostomy site fascia was closed on the inside with running #1 PDS and on the outside with running #1 PDS.  The midline fascia was closed with running #1 looped PDS from each and and tied in the middle.  Subcutaneous tissues were irrigated.  Hemostasis was ensured.  In the skin was closed with staples at the ostomy site in the midline.  All counts were correct.  Sterile dressings were applied.  He tolerated the procedure without apparent complication was taken recovery in stable condition. ?PATIENT DISPOSITION:  PACU - hemodynamically stable. ?  ?Delay start of Pharmacological VTE agent (>24hrs) due to surgical blood loss or risk of bleeding:  no ? ?Georganna Skeans, MD, MPH, FACS ?Pager:  [phone]  ?3/21/20239:47 AM ? ? ? ? ? ? ? ? ? ? ? ? ?  ?

## 2021-07-10 NOTE — Transfer of Care (Signed)
Immediate Anesthesia Transfer of Care Note ? ?Patient: Alejandro Shelton ? ?Procedure(s) Performed: COLOSTOMY TAKEDOWN (Abdomen) ? ?Patient Location: PACU ? ?Anesthesia Type:General ? ?Level of Consciousness: drowsy ? ?Airway & Oxygen Therapy: Patient Spontanous Breathing and Patient connected to nasal cannula oxygen ? ?Post-op Assessment: Report given to RN and Post -op Vital signs reviewed and stable ? ?Post vital signs: Reviewed and stable ? ?Last Vitals:  ?Vitals Value Taken Time  ?BP 120/72 07/10/21 0951  ?Temp    ?Pulse 76 07/10/21 0952  ?Resp 9 07/10/21 0952  ?SpO2 99 % 07/10/21 0952  ?Vitals shown include unvalidated device data. ? ?Last Pain:  ?Vitals:  ? 07/10/21 0629  ?TempSrc:   ?PainSc: 0-No pain  ?   ? ?  ? ?Complications: No notable events documented. ?

## 2021-07-10 NOTE — Anesthesia Procedure Notes (Signed)
Procedure Name: Intubation ?Date/Time: 07/10/2021 7:37 AM ?Performed by: Waynard Edwards, CRNA ?Pre-anesthesia Checklist: Patient identified, Emergency Drugs available, Suction available and Patient being monitored ?Patient Re-evaluated:Patient Re-evaluated prior to induction ?Oxygen Delivery Method: Circle system utilized ?Preoxygenation: Pre-oxygenation with 100% oxygen ?Induction Type: IV induction ?Ventilation: Mask ventilation without difficulty ?Laryngoscope Size: Hyacinth Meeker and 2 ?Grade View: Grade I ?Tube type: Oral ?Tube size: 7.5 mm ?Number of attempts: 1 ?Airway Equipment and Method: Stylet ?Placement Confirmation: ETT inserted through vocal cords under direct vision, positive ETCO2 and breath sounds checked- equal and bilateral ?Secured at: 23 cm ?Tube secured with: Tape ?Dental Injury: Teeth and Oropharynx as per pre-operative assessment  ? ? ? ? ?
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
## 2021-07-11 ENCOUNTER — Inpatient Hospital Stay (HOSPITAL_COMMUNITY): Payer: Commercial Managed Care - HMO

## 2021-07-11 ENCOUNTER — Encounter (HOSPITAL_COMMUNITY): Payer: Self-pay | Admitting: General Surgery

## 2021-07-11 LAB — SURGICAL PATHOLOGY

## 2021-07-11 LAB — CBC
HCT: 30.2 % — ABNORMAL LOW (ref 39.0–52.0)
HCT: 20.7 % — ABNORMAL LOW (ref 39.0–52.0)
Hemoglobin: 9.8 g/dL — ABNORMAL LOW (ref 13.0–17.0)
Hemoglobin: 7.3 g/dL — ABNORMAL LOW (ref 13.0–17.0)
MCH: 27.9 pg (ref 26.0–34.0)
MCH: 28.4 pg (ref 26.0–34.0)
MCHC: 32.5 g/dL (ref 30.0–36.0)
MCHC: 35.3 g/dL (ref 30.0–36.0)
MCV: 86 fL (ref 80.0–100.0)
MCV: 80.5 fL (ref 80.0–100.0)
Platelets: 196 10*3/uL (ref 150–400)
Platelets: 139 10*3/uL — ABNORMAL LOW (ref 150–400)
RBC: 3.51 MIL/uL — ABNORMAL LOW (ref 4.22–5.81)
RBC: 2.57 MIL/uL — ABNORMAL LOW (ref 4.22–5.81)
RDW: 15.5 % (ref 11.5–15.5)
RDW: 15.2 % (ref 11.5–15.5)
WBC: 23.5 10*3/uL — ABNORMAL HIGH (ref 4.0–10.5)
WBC: 23.8 10*3/uL — ABNORMAL HIGH (ref 4.0–10.5)
nRBC: 0 % (ref 0.0–0.2)
nRBC: 0 % (ref 0.0–0.2)

## 2021-07-11 LAB — BASIC METABOLIC PANEL
Anion gap: 13 (ref 5–15)
Anion gap: 9 (ref 5–15)
BUN: 18 mg/dL (ref 6–20)
BUN: 21 mg/dL — ABNORMAL HIGH (ref 6–20)
CO2: 21 mmol/L — ABNORMAL LOW (ref 22–32)
CO2: 24 mmol/L (ref 22–32)
Calcium: 8.2 mg/dL — ABNORMAL LOW (ref 8.9–10.3)
Calcium: 8.3 mg/dL — ABNORMAL LOW (ref 8.9–10.3)
Chloride: 101 mmol/L (ref 98–111)
Chloride: 102 mmol/L (ref 98–111)
Creatinine, Ser: 1.67 mg/dL — ABNORMAL HIGH (ref 0.61–1.24)
Creatinine, Ser: 1.45 mg/dL — ABNORMAL HIGH (ref 0.61–1.24)
GFR, Estimated: 53 mL/min — ABNORMAL LOW (ref >60)
GFR, Estimated: >60 mL/min (ref >60)
Glucose, Bld: 162 mg/dL — ABNORMAL HIGH (ref 70–99)
Glucose, Bld: 118 mg/dL — ABNORMAL HIGH (ref 70–99)
Potassium: 5.6 mmol/L — ABNORMAL HIGH (ref 3.5–5.1)
Potassium: 4.6 mmol/L (ref 3.5–5.1)
Sodium: 135 mmol/L (ref 135–145)
Sodium: 135 mmol/L (ref 135–145)

## 2021-07-11 LAB — GLUCOSE, CAPILLARY: Glucose-Capillary: 129 mg/dL — ABNORMAL HIGH (ref 70–99)

## 2021-07-11 MED ORDER — HYDROMORPHONE HCL 1 MG/ML IJ SOLN
0.5000 mg | INTRAMUSCULAR | Status: DC | PRN
  Administered 2021-07-11 – 2021-07-17 (×49): 1 mg via INTRAVENOUS
  Administered 2021-07-18: 0.5 mg via INTRAVENOUS
  Administered 2021-07-18 – 2021-07-23 (×50): 1 mg via INTRAVENOUS
  Filled 2021-07-11 (×104): qty 1

## 2021-07-11 MED ORDER — CHLORHEXIDINE GLUCONATE CLOTH 2 % EX PADS
6.0000 | MEDICATED_PAD | Freq: Every day | CUTANEOUS | Status: DC
  Administered 2021-07-11 – 2021-07-12 (×2): 6 via TOPICAL

## 2021-07-11 MED ORDER — HYDRALAZINE HCL 20 MG/ML IJ SOLN
10.0000 mg | Freq: Once | INTRAMUSCULAR | Status: AC
  Administered 2021-07-11: 10 mg via INTRAVENOUS
  Filled 2021-07-11: qty 1

## 2021-07-11 MED ORDER — ACETAMINOPHEN 500 MG PO TABS
1000.0000 mg | ORAL_TABLET | Freq: Four times a day (QID) | ORAL | Status: DC
  Administered 2021-07-11: 1000 mg via ORAL
  Filled 2021-07-11: qty 2

## 2021-07-11 MED ORDER — SODIUM CHLORIDE 0.45 % IV SOLN: INTRAVENOUS | Status: AC

## 2021-07-11 MED ORDER — ACETAMINOPHEN 10 MG/ML IV SOLN
1000.0000 mg | Freq: Four times a day (QID) | INTRAVENOUS | Status: AC
  Administered 2021-07-11 – 2021-07-12 (×3): 1000 mg via INTRAVENOUS
  Filled 2021-07-11 (×3): qty 100

## 2021-07-11 MED ORDER — NALOXONE HCL 0.4 MG/ML IJ SOLN
0.4000 mg | Freq: Once | INTRAMUSCULAR | Status: AC
  Administered 2021-07-11: 0.4 mg via INTRAVENOUS

## 2021-07-11 MED ORDER — SODIUM CHLORIDE 0.9 % IV BOLUS
1000.0000 mL | Freq: Once | INTRAVENOUS | Status: AC
Start: 2021-07-11 — End: 2021-07-11
  Administered 2021-07-11: 1000 mL via INTRAVENOUS

## 2021-07-11 MED ORDER — METOPROLOL TARTRATE 5 MG/5ML IV SOLN
5.0000 mg | Freq: Four times a day (QID) | INTRAVENOUS | Status: DC
  Administered 2021-07-11 – 2021-07-17 (×24): 5 mg via INTRAVENOUS
  Filled 2021-07-11 (×24): qty 5

## 2021-07-11 MED ORDER — HYDROMORPHONE HCL 1 MG/ML IJ SOLN
1.0000 mg | INTRAMUSCULAR | Status: DC | PRN
  Administered 2021-07-11: 1 mg via INTRAVENOUS
  Filled 2021-07-11: qty 1

## 2021-07-11 MED ORDER — KETOROLAC TROMETHAMINE 15 MG/ML IJ SOLN: 30.0000 mg | Freq: Once | INTRAMUSCULAR | Status: DC

## 2021-07-11 MED ORDER — NALOXONE HCL 0.4 MG/ML IJ SOLN
INTRAMUSCULAR | Status: AC
Start: 2021-07-11 — End: 2021-07-11
  Filled 2021-07-11: qty 1

## 2021-07-11 MED ORDER — HYDROMORPHONE HCL 1 MG/ML IJ SOLN
0.5000 mg | Freq: Once | INTRAMUSCULAR | Status: DC
  Filled 2021-07-11: qty 0.5

## 2021-07-11 MED ORDER — SODIUM ZIRCONIUM CYCLOSILICATE 5 G PO PACK
5.0000 g | PACK | Freq: Once | ORAL | Status: AC
  Administered 2021-07-11: 5 g via ORAL
  Filled 2021-07-11: qty 1

## 2021-07-11 NOTE — Progress Notes (Addendum)
Pt screamed out in pain at approx 0410 Alert and oriented x4 poss 1. Pt given dilaudid. See additional notes for more information. ? 07/11/21 0436 07/11/21 0445 07/11/21 0455  ?Assess: MEWS Score  ?Temp 98.1 ?F (36.7 ?C) 98.7 ?F (37.1 ?C) 98.5 ?F (36.9 ?C)  ?BP (!) 134/103 (!) 155/98 (!) 141/108  ?Pulse Rate (!) 124 (!) 132 100  ?Resp 16 18 16   ?Level of Consciousness Alert Alert  --   ?SpO2 98 % 96 % 100 %  ?O2 Device Room Air  --  Room Air  ?Assess: MEWS Score  ?MEWS Temp 0 0 0  ?MEWS Systolic 0 0 0  ?MEWS Pulse 2 3 0  ?MEWS RR 0 0 0  ?MEWS LOC 0 0 0  ?MEWS Score 2 3 0  ?MEWS Score Color Yellow Yellow Green  ?Assess: if the MEWS score is Yellow or Red  ?Were vital signs taken at a resting state? Yes Yes  --   ?Focused Assessment No change from prior assessment No change from prior assessment  --   ?Early Detection of Sepsis Score *See Row Information* Low Low  --   ?MEWS guidelines implemented *See Row Information* No, vital signs rechecked No, vital signs rechecked  --   ?Treat  ?MEWS Interventions Administered prn meds/treatments Administered prn meds/treatments  --   ?Pain Scale 0-10 0-10  --   ?Pain Score 7 8  --   ?Pain Type Surgical pain Surgical pain  --   ?Pain Location Abdomen Abdomen  --   ?Take Vital Signs  ?Increase Vital Sign Frequency  Yellow: Q 2hr X 2 then Q 4hr X 2, if remains yellow, continue Q 4hrs Yellow: Q 2hr X 2 then Q 4hr X 2, if remains yellow, continue Q 4hrs  --   ?Escalate  ?MEWS: Escalate Yellow: discuss with charge nurse/RN and consider discussing with provider and RRT Yellow: discuss with charge nurse/RN and consider discussing with provider and RRT  --   ?Notify: Charge Nurse/RN  ?Name of Charge Nurse/RN Notified Yesmine RN Sugden RN  --   ?Date Charge Nurse/RN Notified 07/11/21 07/11/21  --   ?Time Charge Nurse/RN Notified [phone]  --   ?Notify: Provider  ?Provider Name/Title [id number]  --   ?Date Provider Notified 07/11/21 07/11/21  --   ?Time Provider Notified [phone]   --   ?Notification Type Page Page  --   ?Notification Reason Other (Comment) ?(Page) Other (Comment) ?(Mews 3 and had to give metoprolol.)  --   ?Provider response See new orders See new orders  --   ?Date of Provider Response 07/11/21 07/11/21  --   ?Time of Provider Response [phone]  --   ?Notify: Rapid Response  ?Name of Rapid Response RN Notified  --  Trauma RN Emilee  --   ?Date Rapid Response Notified  --  07/11/21  --   ?Time Rapid Response Notified  --  0610  --   ?Document  ?Patient Outcome  --  Not stable and remains on department ?(Trauma RN was able to get through to MD at approx 0620)  --   ?Progress note created (see row info)  --  Yes  --   ? ?

## 2021-07-11 NOTE — Progress Notes (Signed)
Trauma Event Note ? ? ? ?Reason for Call : ? ?Called by Rapid Response RN for decline in LOC/respirations, diaphoresis  ? ?Initial Focused Assessment: ? ?Arrived to bedside, pt sedated, respirations 6, responsive to verbal stimuli but falls asleep during conversation. ?Primary nurse at bedside reports patient has been diaphoretic, appears more pale than earlier in the shift ?Pupils 57mm PERRLA  ? ?Interventions: ? ?Notified MD via telephone, verbal order for narcan IV ? ?Plan of Care: ? ?Reduce opioid administration ?One time dose toradol for pain control ? ?Event Summary: ? ?Called by RR RN d/t decline in patient's mental status and diaphoresis, concerned for oversedation. Patient lethargic upon my arrival to bedside, appears more pale that earlier in the shift. Respirations 6, responsive to verbal stimuli and alert x4. Primary RN has had difficulty getting in touch with Trauma MD, called [facility] and provided update regarding pt status, verbal order given for IV narcan x1. Dr. Bobbye Morton arrived to bedside 0630. Respiratory rate improved to 11 after narcan administration. Plan to administer one time dose of toradol for pain control.  ? ?MD Notified: Bobbye Morton [phone] ?Call Time: 0611 ?TRN Arrival [id number] ?End Time:0700 ? ?Last imported Vital Signs ?BP (!) 121/102 (BP Location: Right Arm)   Pulse 100   Temp 98.5 ?F (36.9 ?C)   Resp (!) 6 Comment: MD paged by primary RN, pt rouseable to verbal stimuli  Ht 5\' 11"  (1.803 m)   Wt 173 lb (78.5 kg)   SpO2 100%   BMI 24.13 kg/m?  ? ?Trending CBC ?Recent Labs  ?  07/11/21 ?0527  ?WBC 23.5*  ?HGB 9.8*  ?HCT 30.2*  ?PLT 196  ? ? ?Trending Coag's ?No results for input(s): APTT, INR in the last 72 hours. ? ?Trending BMET ?No results for input(s): NA, K, CL, CO2, BUN, CREATININE, GLUCOSE in the last 72 hours. ? ? ? ?Scott  ?Trauma Response RN ? ?Please call TRN at [phone] for further assistance. ? ? ?  ?
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
## 2021-07-11 NOTE — Anesthesia Postprocedure Evaluation (Signed)
Anesthesia Post Note ? ?Patient: Alejandro Shelton ? ?Procedure(s) Performed: COLOSTOMY TAKEDOWN (Abdomen) ? ?  ? ?Patient location during evaluation: PACU ?Anesthesia Type: General ?Level of consciousness: awake and alert ?Pain management: pain level controlled ?Vital Signs Assessment: post-procedure vital signs reviewed and stable ?Respiratory status: spontaneous breathing, nonlabored ventilation, respiratory function stable and patient connected to nasal cannula oxygen ?Cardiovascular status: blood pressure returned to baseline and stable ?Postop Assessment: no apparent nausea or vomiting ?Anesthetic complications: no ? ? ?No notable events documented. ? ?Last Vitals:  ?Vitals:  ? 07/11/21 1550 07/11/21 1815  ?BP:  128/71  ?Pulse:  (!) 131  ?Resp: 16 17  ?Temp: 37.2 ?C 37.2 ?C  ?SpO2: 98% 97%  ?  ?Last Pain:  ?Vitals:  ? 07/11/21 1550  ?TempSrc: Oral  ?PainSc:   ? ? ?  ?  ?  ?  ?  ?  ? ?Mechele Kittleson S ? ? ? ? ?

## 2021-07-11 NOTE — Significant Event (Addendum)
Rapid Response Event Note  ? ?Reason for Call :  ?HR 150 ? ?Initial Focused Assessment:  ?Pt lying in bed, drowsy, oriented. Skin is warm, diaphoretic. Lung sounds are clear. Abdomen is firm, distended; per RN this is unchanged form her morning shift assessment. Heart rate is fast, regular with no adventitious heart sounds. He states his pain is better. He endorses tightness in his abdomen.   ? ?VS: T 98.61F, BP 127/76, HR 152, RR 16, SpO2 98% on room air ? ?Plan of Care:  ?-Continue scheduled Lopressor ?-BMP and CBC now ? ?Event Summary:  ?MD Notified: Gevena Cotton., PA ?Call Time: 1528 ?Arrival Time: 1530 ?End Time: 1600 ? ?Jennye Moccasin, RN ?

## 2021-07-11 NOTE — Progress Notes (Signed)
Seen and examined. Patient is very drowsy, drifts off during conversation, hypopneic. Otherwise, vitals normal. Narcan, d/w patient expectation of temporarily increased pain. Labs with elevated creatinine, so cannot give toradol. D/c gabapentin. Monitor closely. ? ? ?Diamantina Monks, MD ?General and Trauma Surgery ?[facility] ? ?
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
## 2021-07-11 NOTE — Progress Notes (Signed)
Trauma Event Note ? ?TRN at bedside to round. Patient sleeping with RR 12 during my assessment. NG tube in place, intermittent suction with greenish output. HR 120s ST on cardiac monitor.  ? ? ? ?Last imported Vital Signs ?BP (!) 150/102 (BP Location: Left Arm)   Pulse (!) 127   Temp 98.4 ?F (36.9 ?C)   Resp 18   Ht 5\' 11"  (1.803 m)   Wt 173 lb (78.5 kg)   SpO2 100%   BMI 24.13 kg/m?  ? ?Trending CBC ?Recent Labs  ?  07/11/21 ?07/13/21 07/11/21 ?1626  ?WBC 23.5* 23.8*  ?HGB 9.8* 7.3*  ?HCT 30.2* 20.7*  ?PLT 196 139*  ? ? ?Trending Coag's ?No results for input(s): APTT, INR in the last 72 hours. ? ?Trending BMET ?Recent Labs  ?  07/11/21 ?07/13/21 07/11/21 ?1626  ?NA 135 135  ?K 5.6* 4.6  ?CL 101 102  ?CO2 21* 24  ?BUN 18 21*  ?CREATININE 1.67* 1.45*  ?GLUCOSE 162* 118*  ? ? ? ? ?Alejandro Shelton O Doreather Hoxworth  ?Trauma Response RN ? ?Please call TRN at [phone] for further assistance. ? ? ?  ?

## 2021-07-11 NOTE — Progress Notes (Signed)
?   07/11/21 1343  ?Vitals  ?BP (!) 158/102  ?MAP (mmHg) 118  ?BP Location Right Arm  ?BP Method Automatic  ?Patient Position (if appropriate) Lying  ?Pulse Rate (!) 106  ?Pulse Rate Source Monitor  ?MEWS COLOR  ?MEWS Score Color Yellow  ?Oxygen Therapy  ?SpO2 95 %  ?MEWS Score  ?MEWS Temp 0  ?MEWS Systolic 0  ?MEWS Pulse 1  ?MEWS RR 1  ?MEWS LOC 0  ?MEWS Score 2  ? ? ?

## 2021-07-11 NOTE — Progress Notes (Addendum)
Trauma RN contacted Trauma MD. Narcan ordered and given 0631. Approx 10 min after administration pt more alert. Report given to Alejandro Graff RN also made aware due to pt status and event delay in removal of Foley so it would need to be removed when pt stable.  ?

## 2021-07-11 NOTE — Progress Notes (Signed)
Trauma Event Note ? ? ? ?Reason for Call : ?Rapid response nurse notified this TRN regarding pt's heart rate of 150 after receiving Dilaudid.  ?Trauma PA at bedside- new orders given.  ?Labs being drawn on my arrival to bedside. Pt is alert/oriented x 4, w/d- heart rate in 110s-120s on monitor,  ? ?Primary RN stated that  there was an order for NGT placement-- will follow up. TRNs will continue to monitor.  ? ? ?Last imported Vital Signs ?BP 127/76 (BP Location: Left Arm)   Pulse (!) 106   Temp 98.9 ?F (37.2 ?C) (Oral)   Resp 16   Ht 5\' 11"  (1.803 m)   Wt 173 lb (78.5 kg)   SpO2 98%   BMI 24.13 kg/m?  ? ?Trending CBC ?Recent Labs  ?  07/11/21 ?07/13/21 07/11/21 ?1626  ?WBC 23.5* 23.8*  ?HGB 9.8* 7.3*  ?HCT 30.2* 20.7*  ?PLT 196 139*  ? ? ?Trending Coag's ?No results for input(s): APTT, INR in the last 72 hours. ? ?Trending BMET ?Recent Labs  ?  07/11/21 ?07/13/21 07/11/21 ?1626  ?NA 135 135  ?K 5.6* 4.6  ?CL 101 102  ?CO2 21* 24  ?BUN 18 21*  ?CREATININE 1.67* 1.45*  ?GLUCOSE 162* 118*  ? ? ? ? ?07/13/21 Alejandro Shelton  ?Trauma Response RN ? ?Please call TRN at [phone] for further assistance. ? ? ?  ?

## 2021-07-11 NOTE — Progress Notes (Signed)
Trauma Event Note ? ? ?TRN at bedside to round on patient. Diaphoretic upon my arrival, endorses severe pain that is not relieved by PRNs for very long. States feeling like he has to have a bowel movement "but not from my rectum." States pain returns quickly after med admin. Hypertensive on last BP check 165/115. HR 87. Repositioning himself in bed attempting to find position of comfort. Spoke with primary RN Janace Hoard, PRN dilaudid given by myself. Patient sedated, falling asleep during administration of pain meds. Educated patient about communicating with his care team about needs r/t pain control. ? ?Spoke with day shift RN as well, PRN pain management oxycodone and dilaudid given on schedule since return from OR in attempt to control patient's pain. Heat packs have also been applied.  ? ?Last imported Vital Signs ?BP (!) 131/92 (BP Location: Right Arm)   Pulse (!) 108   Temp (!) 97.5 ?F (36.4 ?C)   Resp 16   Ht 5\' 11"  (1.803 m)   Wt 173 lb (78.5 kg)   SpO2 99%   BMI 24.13 kg/m?  ? ? ? ?Alejandro Shelton Alejandro Shelton  ?Trauma Response RN ? ?Please call TRN at [phone] for further assistance. ? ? ?  ?

## 2021-07-11 NOTE — Progress Notes (Addendum)
Vitals that flagged mews was not at rest pt on bedside. Once in bed and pain medication effective VS improved and mews green. Dr was contacted due to elevated bp and meds ordered for BP and HR with parameters to follow. Current BP did not meet parameters. Was able to inform Charge RN at 0450 07/11/21 [facility] RN ? 07/10/21 2216 07/10/21 2220 07/10/21 2239  ?Assess: MEWS Score  ?Temp 97.8 ?F (36.6 ?C)  --  (!) 97.1 ?F (36.2 ?C)  ?BP (!) 163/109 ?(Pt in severe pain on edge of bed was diaphorectic. Meds given and back in bed vitals rechecked.)  --  (!) 128/101  ?Pulse Rate (!) 120  --  (!) 110  ?Resp  --   --  16  ?Level of Consciousness  --  Alert  --   ?SpO2 100 %  --  96 %  ?O2 Device  --   --   --   ?Assess: MEWS Score  ?MEWS Temp 0 0 0  ?MEWS Systolic 0 0 0  ?MEWS Pulse 2 2 1   ?MEWS RR 0 0 0  ?MEWS LOC 0 0 0  ?MEWS Score 2 2 1   ?MEWS Score Color Yellow Yellow Green  ?Assess: if the MEWS score is Yellow or Red  ?Were vital signs taken at a resting state? No  --   --   ?Focused Assessment No change from prior assessment  --   --   ?Early Detection of Sepsis Score *See Row Information* Low  --   --   ?MEWS guidelines implemented *See Row Information* No, vital signs rechecked  --   --   ?Treat  ?MEWS Interventions Administered prn meds/treatments  --   --   ?Pain Scale 0-10  --   --   ?Pain Score 6  --   --   ?Pain Intervention(s) Medication (See eMAR)  --   --   ?Notify: Provider  ?Provider Name/Title Lovick  --   --   ?Date Provider Notified 07/10/21  --   --   ?Time Provider Notified 2250  --   --   ?Notification Type Page  --   --   ?Notification Reason Other (Comment) ?(BP elevated HR meds given now improved but DBP 104)  --   --   ?Document  ?Patient Outcome Stabilized after interventions  --   --   ? ? 07/10/21 2240  ?Assess: MEWS Score  ?Temp (!) 97.1 ?F (36.2 ?C)  ?BP (!) 149/104  ?Pulse Rate (!) 107  ?Resp 16  ?Level of Consciousness  --   ?SpO2 99 %  ?O2 Device Room Air  ?Assess: MEWS Score  ?MEWS Temp 0   ?MEWS Systolic 0  ?MEWS Pulse 1  ?MEWS RR 0  ?MEWS LOC 0  ?MEWS Score 1  ?MEWS Score Color Green  ?Assess: if the MEWS score is Yellow or Red  ?Were vital signs taken at a resting state?  --   ?Focused Assessment  --   ?Early Detection of Sepsis Score *See Row Information*  --   ?MEWS guidelines implemented *See Row Information*  --   ?Treat  ?MEWS Interventions  --   ?Pain Scale  --   ?Pain Score  --   ?Pain Intervention(s)  --   ?Notify: Provider  ?Provider Name/Title  --   ?Date Provider Notified  --   ?Time Provider Notified  --   ?Notification Type  --   ?Notification Reason  --   ?Document  ?Patient  Outcome  --   ? ?

## 2021-07-12 LAB — PREPARE RBC (CROSSMATCH)

## 2021-07-12 LAB — CBC
HCT: 18.2 % — ABNORMAL LOW (ref 39.0–52.0)
Hemoglobin: 6.2 g/dL — CL (ref 13.0–17.0)
MCH: 27.8 pg (ref 26.0–34.0)
MCHC: 34.1 g/dL (ref 30.0–36.0)
MCV: 81.6 fL (ref 80.0–100.0)
Platelets: 132 10*3/uL — ABNORMAL LOW (ref 150–400)
RBC: 2.23 MIL/uL — ABNORMAL LOW (ref 4.22–5.81)
RDW: 15.3 % (ref 11.5–15.5)
WBC: 19.2 10*3/uL — ABNORMAL HIGH (ref 4.0–10.5)
nRBC: 0 % (ref 0.0–0.2)

## 2021-07-12 LAB — HEMOGLOBIN AND HEMATOCRIT, BLOOD
HCT: 20.4 % — ABNORMAL LOW (ref 39.0–52.0)
HCT: 20.7 % — ABNORMAL LOW (ref 39.0–52.0)
Hemoglobin: 6.9 g/dL — CL (ref 13.0–17.0)
Hemoglobin: 7 g/dL — ABNORMAL LOW (ref 13.0–17.0)

## 2021-07-12 LAB — BASIC METABOLIC PANEL
Anion gap: 7 (ref 5–15)
BUN: 18 mg/dL (ref 6–20)
CO2: 25 mmol/L (ref 22–32)
Calcium: 8.3 mg/dL — ABNORMAL LOW (ref 8.9–10.3)
Chloride: 100 mmol/L (ref 98–111)
Creatinine, Ser: 1.21 mg/dL (ref 0.61–1.24)
GFR, Estimated: >60 mL/min (ref >60)
Glucose, Bld: 106 mg/dL — ABNORMAL HIGH (ref 70–99)
Potassium: 4.7 mmol/L (ref 3.5–5.1)
Sodium: 132 mmol/L — ABNORMAL LOW (ref 135–145)

## 2021-07-12 LAB — MAGNESIUM: Magnesium: 1.7 mg/dL (ref 1.7–2.4)

## 2021-07-12 LAB — ABO/RH: ABO/RH(D): A POS

## 2021-07-12 MED ORDER — MENTHOL 3 MG MT LOZG
1.0000 | LOZENGE | OROMUCOSAL | Status: DC | PRN
  Administered 2021-07-12 – 2021-07-21 (×6): 3 mg via ORAL
  Filled 2021-07-12 (×10): qty 9

## 2021-07-12 MED ORDER — PHENOL 1.4 % MT LIQD
1.0000 | OROMUCOSAL | Status: DC | PRN
  Administered 2021-07-12 – 2021-07-18 (×2): 1 via OROMUCOSAL
  Filled 2021-07-12: qty 177

## 2021-07-12 MED ORDER — SODIUM CHLORIDE 0.9% IV SOLUTION: Freq: Once | INTRAVENOUS | Status: AC

## 2021-07-12 NOTE — Progress Notes (Signed)
Trauma Event Note ? ? ?Morning rounds with Trauma PA- pt NGT draining greenish liquid after being irrigated by PA-  ?1 UPRBCs tranfusion complete. ?Will continue to monitor.  ? ? ? ?Last imported Vital Signs ?BP (!) 152/100   Pulse (!) 106   Temp 98.2 ?F (36.8 ?C) (Oral)   Resp 16   Ht 5\' 11"  (1.803 m)   Wt 173 lb (78.5 kg)   SpO2 99%   BMI 24.13 kg/m?  ? ?Trending CBC ?Recent Labs  ?  07/11/21 ?07/13/21 07/11/21 ?1626 07/12/21 ?0134  ?WBC 23.5* 23.8* 19.2*  ?HGB 9.8* 7.3* 6.2*  ?HCT 30.2* 20.7* 18.2*  ?PLT 196 139* 132*  ? ? ?Trending Coag's ?No results for input(s): APTT, INR in the last 72 hours. ? ?Trending BMET ?Recent Labs  ?  07/11/21 ?07/13/21 07/11/21 ?1626 07/12/21 ?0134  ?NA 135 135 132*  ?K 5.6* 4.6 4.7  ?CL 101 102 100  ?CO2 21* 24 25  ?BUN 18 21* 18  ?CREATININE 1.67* 1.45* 1.21  ?GLUCOSE 162* 118* 106*  ? ? ? ? ?07/14/21 Alejandro Shelton  ?Trauma Response RN ? ?Please call TRN at [phone] for further assistance. ? ? ?  ?

## 2021-07-12 NOTE — Progress Notes (Signed)
Date and time results received: 07/12/21 1116 ?(use smartphrase ".now" to insert current time) ? ?Test: Hgb ?Critical Value: Hgb 6.9 ? ?Name of Provider Notified: Barnetta Chapel, PA ? ?Orders Received? Or Actions Taken?: Actions Taken: Will reorder f/u test ?

## 2021-07-12 NOTE — Progress Notes (Signed)
[facility] ?Progress Note ? ?2 Days Post-Op  ?Subjective: ?NGT placed overnight.  No flatus.  Having some low back pain this morning.  Feels very distended. HR improved today. ? ?Objective: ?Vital signs in last 24 hours: ?Temp:  [97.7 ?F (36.5 ?C)-99.1 ?F (37.3 ?C)] 97.7 ?F (36.5 ?C) (03/23 0840) ?Pulse Rate:  [84-131] 95 (03/23 0840) ?Resp:  [13-18] 18 (03/23 0840) ?BP: (127-158)/(71-102) 148/94 (03/23 0840) ?SpO2:  [95 %-100 %] 98 % (03/23 0840) ?Last BM Date : 07/09/21 ? ?Intake/Output from previous day: ?03/22 0701 - 03/23 0700 ?In: 2635 [I.V.:2135; IV Piggyback:500] ?Out: 2200 [Urine:2000; Emesis/NG output:200] ?Intake/Output this shift: ?Total I/O ?In: 315 [Blood:315] ?Out: -  ? ?PE: ?Gen:  Alert, NAD, pleasant and cooperative ?Card:  Regular rate and rhythm ?Pulm:  Normal effort, clear to auscultation bilaterally ?Abd: tight, distended, few BS, NGT with bilious output, all incisions are intact with some old bloody drainage noted on all honeycomb dressings.  Ecchymosis present along incisions as well. ?Skin: warm and dry, no rashes  ?Psych: A&Ox3  ? ?Lab Results:  ?Recent Labs  ?  07/11/21 ?1626 07/12/21 ?0134  ?WBC 23.8* 19.2*  ?HGB 7.3* 6.2*  ?HCT 20.7* 18.2*  ?PLT 139* 132*  ? ?BMET ?Recent Labs  ?  07/11/21 ?1626 07/12/21 ?0134  ?NA 135 132*  ?K 4.6 4.7  ?CL 102 100  ?CO2 24 25  ?GLUCOSE 118* 106*  ?BUN 21* 18  ?CREATININE 1.45* 1.21  ?CALCIUM 8.3* 8.3*  ? ?PT/INR ?No results for input(s): LABPROT, INR in the last 72 hours. ?CMP  ?   ?Component Value Date/Time  ? NA 132 (L) 07/12/2021 0134  ? NA 138 02/09/2021 1036  ? K 4.7 07/12/2021 0134  ? CL 100 07/12/2021 0134  ? CO2 25 07/12/2021 0134  ? GLUCOSE 106 (H) 07/12/2021 0134  ? BUN 18 07/12/2021 0134  ? BUN 11 02/09/2021 1036  ? CREATININE 1.21 07/12/2021 0134  ? CALCIUM 8.3 (L) 07/12/2021 0134  ? PROT 6.9 10/08/2020 0713  ? ALBUMIN 3.5 10/08/2020 0713  ? AST 88 (H) 10/08/2020 0713  ? ALT 352 (H) 10/08/2020 [id number]  ? ALKPHOS 114 10/08/2020 0713   ? BILITOT 1.4 (H) 10/08/2020 [id number]  ? GFRNONAA >60 07/12/2021 0134  ? GFRAA >90 12/12/2012 0526  ? ?Lipase  ?   ?Component Value Date/Time  ? LIPASE 60 (H) 10/07/2020 2307  ? ? ? ? ? ?Studies/Results: ?DG Chest Port 1 View ? ?Result Date: 07/11/2021 ?CLINICAL DATA:  Tachycardia. Status post colostomy takedown yesterday. EXAM: PORTABLE CHEST 1 VIEW COMPARISON:  Chest x-ray dated October 07, 2020. FINDINGS: The heart size and mediastinal contours are within normal limits. Normal pulmonary vascularity. Minimal left basilar atelectasis. No focal consolidation, pleural effusion, or pneumothorax. No acute osseous abnormality. IMPRESSION: 1. Minimal left basilar atelectasis. Electronically Signed   By: Titus Dubin M.D.   On: 07/11/2021 16:39  ? ?DG Abd Portable 1V ? ?Result Date: 07/11/2021 ?CLINICAL DATA:  NG tube placement EXAM: PORTABLE ABDOMEN - 1 VIEW COMPARISON:  Same day radiograph FINDINGS: Limited radiograph of the lower chest and upper abdomen was obtained for the purposes of enteric tube localization. Enteric tube is seen coursing below the diaphragm with distal tip and side port terminating within the expected location of the gastric body. IMPRESSION: Enteric tube tip and side port project within the expected location of the gastric body. Electronically Signed   By: Davina Poke D.O.   On: 07/11/2021 17:25  ? ?DG Abd Portable 1V ? ?  Result Date: 07/11/2021 ?CLINICAL DATA:  Ileus.  Status post colostomy takedown yesterday. EXAM: PORTABLE ABDOMEN - 1 VIEW COMPARISON:  Abdominal x-ray dated October 09, 2020. FINDINGS: There are a few mildly dilated loops of air-filled small bowel and colon. Distended stomach. Skin staples over lying the midline and right abdomen. No acute osseous abnormality. IMPRESSION: 1. Few mildly dilated loops of air-filled small bowel and colon suggestive of ileus. Electronically Signed   By: Titus Dubin M.D.   On: 07/11/2021 16:38   ? ?Anti-infectives: ?Anti-infectives (From admission,  onward)  ? ? Start     Dose/Rate Route Frequency Ordered Stop  ? 07/10/21 0600  cefoTEtan (CEFOTAN) 2 g in sodium chloride 0.9 % 100 mL IVPB       ? 2 g ?200 mL/hr over 30 Minutes Intravenous On call to O.R. 07/10/21 [id number] 07/10/21 0809  ? ?  ? ? ? ?Assessment/Plan ? POD#2, S/P open lysis of adhesions and colostomy takedown 07/10/21 Dr. Grandville Silos ?- afebrile, WBC down to 18K today. ?- post op ileus, NGT in place, await bowel function ?- IS/pulm toilet, OOB ?- pain: scheduld APAP 1,000 mg q 6h, IV robaxin 1,000 mg q 8h, Oxy 5-10 mg q 4h PRN, prn dilaudid ?-mobilize/pulm toilet ?AKI - improving, down to 1.21.  cont IVF while NGT in place and not taking in PO ?ABL anemia - hgb 6.8.  1 unit pRBCs, check H&H midday, and labs in am ?Tachy/HTN - prn hydralazine, scheduled 5mg  lopressor.  HR improved today ?FEN: NPO/NGT/IVFs/K normal ?ID: perioperative cefotan 3/21  ?VTE: SCD's, lovenox  ?Foley: DC foley 3/23 ?Dispo: inpatient trauma service, ileus, telemetry ? ?nursing notes, last 24 h vitals and pain scores, last 48 h intake and output, last 24 h labs and trends, and last 24 h imaging results. ? ? ?Henreitta Cea, PA-C ?[facility] ?Please see Amion for pager number during day hours 7:00am-4:30pm ? ? ? ? ? ? ?

## 2021-07-12 NOTE — Progress Notes (Signed)
1 unit of PRBC initiated for pt's hgb at 6.2. will monitor  ?

## 2021-07-13 LAB — BASIC METABOLIC PANEL
Anion gap: 10 (ref 5–15)
BUN: 12 mg/dL (ref 6–20)
CO2: 25 mmol/L (ref 22–32)
Calcium: 8.4 mg/dL — ABNORMAL LOW (ref 8.9–10.3)
Chloride: 98 mmol/L (ref 98–111)
Creatinine, Ser: 0.94 mg/dL (ref 0.61–1.24)
GFR, Estimated: >60 mL/min (ref >60)
Glucose, Bld: 102 mg/dL — ABNORMAL HIGH (ref 70–99)
Potassium: 3.8 mmol/L (ref 3.5–5.1)
Sodium: 133 mmol/L — ABNORMAL LOW (ref 135–145)

## 2021-07-13 LAB — CBC
HCT: 19.4 % — ABNORMAL LOW (ref 39.0–52.0)
Hemoglobin: 6.7 g/dL — CL (ref 13.0–17.0)
MCH: 28.8 pg (ref 26.0–34.0)
MCHC: 34.5 g/dL (ref 30.0–36.0)
MCV: 83.3 fL (ref 80.0–100.0)
Platelets: 108 10*3/uL — ABNORMAL LOW (ref 150–400)
RBC: 2.33 MIL/uL — ABNORMAL LOW (ref 4.22–5.81)
RDW: 15.9 % — ABNORMAL HIGH (ref 11.5–15.5)
WBC: 17.7 10*3/uL — ABNORMAL HIGH (ref 4.0–10.5)
nRBC: 0 % (ref 0.0–0.2)

## 2021-07-13 LAB — HEMOGLOBIN AND HEMATOCRIT, BLOOD
HCT: 23.7 % — ABNORMAL LOW (ref 39.0–52.0)
Hemoglobin: 8.1 g/dL — ABNORMAL LOW (ref 13.0–17.0)

## 2021-07-13 LAB — PREPARE RBC (CROSSMATCH)

## 2021-07-13 MED ORDER — SIMETHICONE 40 MG/0.6ML PO SUSP
40.0000 mg | Freq: Four times a day (QID) | ORAL | Status: DC | PRN
  Administered 2021-07-13 – 2021-07-23 (×10): 40 mg
  Filled 2021-07-13 (×15): qty 0.6

## 2021-07-13 NOTE — Progress Notes (Signed)
There is mild bleeding to midline incision site noted. Honeycomb dressing is 50% saturated. Applied ice pack. ? ?Notified MD. Instructed to change dressing and monitor. ? ?Pt refused dressing change at this time.  ?Will monitor. ?

## 2021-07-13 NOTE — Progress Notes (Addendum)
[facility] ?Progress Note ? ?3 Days Post-Op  ?Subjective: ?Had issues overnight with his NGT and nursing per his report.  No flatus but has had 2 "BMs" that were blood and mucus.  Pain is relatively well controlled at this time.  Mobilized some yesterday.  Had some bleeding from his midline incision overnight as well.  This has stopped this morning.   ? ?Objective: ?Vital signs in last 24 hours: ?Temp:  [97.7 ?F (36.5 ?C)-98.9 ?F (37.2 ?C)] 98.1 ?F (36.7 ?C) (03/24 0744) ?Pulse Rate:  [91-106] 91 (03/24 0744) ?Resp:  [16-18] 18 (03/24 0744) ?BP: (133-158)/(90-100) 133/95 (03/24 0744) ?SpO2:  [94 %-100 %] 100 % (03/24 0744) ?Last BM Date :  (pta (ostomy)) ? ?Intake/Output from previous day: ?03/23 0701 - 03/24 0700 ?In: 1483 [I.V.:800; Blood:563; NG/GT:120] ?Out: 2050 [Urine:950; Emesis/NG output:1100] ?Intake/Output this shift: ?No intake/output data recorded. ? ?PE: ?Gen:  Alert, NAD, pleasant and cooperative ?Card:  Regular rate and rhythm ?Pulm:  Normal effort, clear to auscultation bilaterally ?Abd: less distended today, but still bloated.  Hypoactive BS, NGT with 1100cc of output since yesterday.  Midline incisions are dry and intact with ecchymosis surrounding both as well as dried blood.  No active bleeding noted.  Staples present. ?Skin: warm and dry, no rashes  ?Psych: A&Ox3  ? ?Lab Results:  ?Recent Labs  ?  07/12/21 ?0134 07/12/21 ?1016 07/12/21 ?1637 07/13/21 ?0132  ?WBC 19.2*  --   --  17.7*  ?HGB 6.2*   < > 7.0* 6.7*  ?HCT 18.2*   < > 20.7* 19.4*  ?PLT 132*  --   --  108*  ? < > = values in this interval not displayed.  ? ?BMET ?Recent Labs  ?  07/12/21 ?0134 07/13/21 ?0132  ?NA 132* 133*  ?K 4.7 3.8  ?CL 100 98  ?CO2 25 25  ?GLUCOSE 106* 102*  ?BUN 18 12  ?CREATININE 1.21 0.94  ?CALCIUM 8.3* 8.4*  ? ?PT/INR ?No results for input(s): LABPROT, INR in the last 72 hours. ?CMP  ?   ?Component Value Date/Time  ? NA 133 (L) 07/13/2021 0132  ? NA 138 02/09/2021 1036  ? K 3.8 07/13/2021 0132  ?  CL 98 07/13/2021 0132  ? CO2 25 07/13/2021 0132  ? GLUCOSE 102 (H) 07/13/2021 0132  ? BUN 12 07/13/2021 0132  ? BUN 11 02/09/2021 1036  ? CREATININE 0.94 07/13/2021 0132  ? CALCIUM 8.4 (L) 07/13/2021 0132  ? PROT 6.9 10/08/2020 0713  ? ALBUMIN 3.5 10/08/2020 0713  ? AST 88 (H) 10/08/2020 0713  ? ALT 352 (H) 10/08/2020 9562  ? ALKPHOS 114 10/08/2020 0713  ? BILITOT 1.4 (H) 10/08/2020 1308  ? GFRNONAA >60 07/13/2021 0132  ? GFRAA >90 12/12/2012 0526  ? ?Lipase  ?   ?Component Value Date/Time  ? LIPASE 60 (H) 10/07/2020 2307  ? ? ? ? ? ?Studies/Results: ?DG Chest Port 1 View ? ?Result Date: 07/11/2021 ?CLINICAL DATA:  Tachycardia. Status post colostomy takedown yesterday. EXAM: PORTABLE CHEST 1 VIEW COMPARISON:  Chest x-ray dated October 07, 2020. FINDINGS: The heart size and mediastinal contours are within normal limits. Normal pulmonary vascularity. Minimal left basilar atelectasis. No focal consolidation, pleural effusion, or pneumothorax. No acute osseous abnormality. IMPRESSION: 1. Minimal left basilar atelectasis. Electronically Signed   By: Obie Dredge M.D.   On: 07/11/2021 16:39  ? ?DG Abd Portable 1V ? ?Result Date: 07/11/2021 ?CLINICAL DATA:  NG tube placement EXAM: PORTABLE ABDOMEN - 1 VIEW COMPARISON:  Same day radiograph FINDINGS: Limited radiograph of the lower chest and upper abdomen was obtained for the purposes of enteric tube localization. Enteric tube is seen coursing below the diaphragm with distal tip and side port terminating within the expected location of the gastric body. IMPRESSION: Enteric tube tip and side port project within the expected location of the gastric body. Electronically Signed   By: Duanne Guess D.O.   On: 07/11/2021 17:25  ? ?DG Abd Portable 1V ? ?Result Date: 07/11/2021 ?CLINICAL DATA:  Ileus.  Status post colostomy takedown yesterday. EXAM: PORTABLE ABDOMEN - 1 VIEW COMPARISON:  Abdominal x-ray dated October 09, 2020. FINDINGS: There are a few mildly dilated loops of  air-filled small bowel and colon. Distended stomach. Skin staples over lying the midline and right abdomen. No acute osseous abnormality. IMPRESSION: 1. Few mildly dilated loops of air-filled small bowel and colon suggestive of ileus. Electronically Signed   By: Obie Dredge M.D.   On: 07/11/2021 16:38   ? ?Anti-infectives: ?Anti-infectives (From admission, onward)  ? ? Start     Dose/Rate Route Frequency Ordered Stop  ? 07/10/21 0600  cefoTEtan (CEFOTAN) 2 g in sodium chloride 0.9 % 100 mL IVPB       ? 2 g ?200 mL/hr over 30 Minutes Intravenous On call to O.R. 07/10/21 1696 07/10/21 0809  ? ?  ? ? ? ?Assessment/Plan ? POD#3, S/P open lysis of adhesions and colostomy takedown 07/10/21 Dr. Janee Morn ?- afebrile, WBC down to 17K today. ?- post op ileus, NGT in place, await bowel function ?- IS/pulm toilet, OOB ?- pain: scheduld APAP 1,000 mg q 6h, IV robaxin 1,000 mg q 8h, Oxy 5-10 mg q 4h PRN, prn dilaudid ?-mobilize/pulm toilet ?-monitor for further rectal bleeding to assure no anastomotic bleeding persisting ?AKI - resolved ?ABL anemia - hgb 6.7.  1 unit pRBCs 3/23 and 3/24 ?Tachy/HTN - prn hydralazine, scheduled 5mg  lopressor.  HR and BP improved today ?FEN: NPO/NGT/IVFs ?ID: perioperative cefotan 3/21  ?VTE: SCD's, lovenox  ?Foley: DC foley 3/23 ?Dispo: inpatient trauma service, ileus, telemetry ? ?nursing notes, last 24 h vitals and pain scores, last 48 h intake and output, last 24 h labs and trends, and last 24 h imaging results. ? ? ?4/23, PA-C ?[facility] ?Please see Amion for pager number during day hours 7:00am-4:30pm ? ? ? ? ? ? ?

## 2021-07-13 NOTE — Progress Notes (Signed)
Honeycomb dressing 80% saturated with blood noted to pt's abdominal incision. ?Per pt, incision starts bleeding out as soon as he feels his stomach gets distended. ? ?Dressing changed done, pressure dressing applied and ice pack applied to pt's abdominal incision.  ? ?Blood transfusion initiated for hgb at 6.7. per order.  ? ?Will monitor pt. ?

## 2021-07-13 NOTE — Progress Notes (Signed)
?  Transition of Care (TOC) Screening Note ? ? ?Patient Details  ?Name: Alejandro Shelton ?Date of Birth: [birth year] ? ? ?Transition of Care [facility]) CM/SW Contact:    ?Glennon Mac, RN ?Phone Number: ?07/13/2021, 5:11 PM ? ? ? ?Transition of Care Department [facility]) has reviewed patient and no TOC needs have been identified at this time. We will continue to monitor patient advancement through interdisciplinary progression rounds. If new patient transition needs arise, please place a TOC consult. ? ?Quintella Baton, RN, BSN  ?Trauma/Neuro ICU Case Manager ?[phone] ? ?

## 2021-07-14 LAB — TYPE AND SCREEN
ABO/RH(D): A POS
Antibody Screen: NEGATIVE
Unit division: 0
Unit division: 0

## 2021-07-14 LAB — BPAM RBC
Blood Product Expiration Date: 202303302359
Blood Product Expiration Date: 202304012359
ISSUE DATE / TIME: 202303230504
ISSUE DATE / TIME: 202303240303
Unit Type and Rh: 6200
Unit Type and Rh: 6200

## 2021-07-14 LAB — CBC
HCT: 27.1 % — ABNORMAL LOW (ref 39.0–52.0)
Hemoglobin: 9.3 g/dL — ABNORMAL LOW (ref 13.0–17.0)
MCH: 28.5 pg (ref 26.0–34.0)
MCHC: 34.3 g/dL (ref 30.0–36.0)
MCV: 83.1 fL (ref 80.0–100.0)
Platelets: 184 10*3/uL (ref 150–400)
RBC: 3.26 MIL/uL — ABNORMAL LOW (ref 4.22–5.81)
RDW: 15.2 % (ref 11.5–15.5)
WBC: 18.5 10*3/uL — ABNORMAL HIGH (ref 4.0–10.5)
nRBC: 0 % (ref 0.0–0.2)

## 2021-07-14 NOTE — Progress Notes (Signed)
[facility] ?Progress Note ? ?4 Days Post-Op  ?Subjective: ?Feeling better this am than yesterday and had a good night. Had a few bloody/mucus bowel movements yesterday without actual stool. Passing small amount of flatus but belching more. Feeling less bloated. Pain is stable. Mobilizing. No further bleeding from incision.  ? ?Objective: ?Vital signs in last 24 hours: ?Temp:  [97.8 ?F (36.6 ?C)-98.4 ?F (36.9 ?C)] 98.2 ?F (36.8 ?C) (03/25 0734) ?Pulse Rate:  [85-99] 85 (03/25 0734) ?Resp:  [18-22] 18 (03/25 0734) ?BP: (144-159)/(93-111) 152/93 (03/25 0734) ?SpO2:  [98 %-100 %] 100 % (03/25 0734) ?Last BM Date : 07/13/21 ? ?Intake/Output from previous day: ?03/24 0701 - 03/25 0700 ?In: 4057.4 [I.V.:3416.1; NG/GT:80; IV Piggyback:561.3] ?Out: 450 [Urine:300; Emesis/NG output:150] ?Intake/Output this shift: ?No intake/output data recorded. ? ?PE: ?Gen:  Alert, NAD, pleasant and cooperative ?Card:  Regular rate and rhythm ?Pulm:  Normal effort, clear to auscultation bilaterally ?Abd: mild to moderate distension.  Hypoactive BS, NGT with 150cc of output since yesterday per chart - bilious.  Midline incisions are dry and intact with ecchymosis surrounding both, honeycomb dressing c/d/i.  No active bleeding noted.  Staples present. ?Skin: warm and dry, no rashes  ?Psych: A&Ox3  ? ?Lab Results:  ?Recent Labs  ?  07/13/21 ?0132 07/13/21 ?0740 07/14/21 ?1610  ?WBC 17.7*  --  18.5*  ?HGB 6.7* 8.1* 9.3*  ?HCT 19.4* 23.7* 27.1*  ?PLT 108*  --  184  ? ? ?BMET ?Recent Labs  ?  07/12/21 ?0134 07/13/21 ?0132  ?NA 132* 133*  ?K 4.7 3.8  ?CL 100 98  ?CO2 25 25  ?GLUCOSE 106* 102*  ?BUN 18 12  ?CREATININE 1.21 0.94  ?CALCIUM 8.3* 8.4*  ? ? ?PT/INR ?No results for input(s): LABPROT, INR in the last 72 hours. ?CMP  ?   ?Component Value Date/Time  ? NA 133 (L) 07/13/2021 0132  ? NA 138 02/09/2021 1036  ? K 3.8 07/13/2021 0132  ? CL 98 07/13/2021 0132  ? CO2 25 07/13/2021 0132  ? GLUCOSE 102 (H) 07/13/2021 0132  ? BUN 12  07/13/2021 0132  ? BUN 11 02/09/2021 1036  ? CREATININE 0.94 07/13/2021 0132  ? CALCIUM 8.4 (L) 07/13/2021 0132  ? PROT 6.9 10/08/2020 0713  ? ALBUMIN 3.5 10/08/2020 0713  ? AST 88 (H) 10/08/2020 0713  ? ALT 352 (H) 10/08/2020 9604  ? ALKPHOS 114 10/08/2020 0713  ? BILITOT 1.4 (H) 10/08/2020 5409  ? GFRNONAA >60 07/13/2021 0132  ? GFRAA >90 12/12/2012 0526  ? ?Lipase  ?   ?Component Value Date/Time  ? LIPASE 60 (H) 10/07/2020 2307  ? ? ? ? ? ?Studies/Results: ?No results found. ? ?Anti-infectives: ?Anti-infectives (From admission, onward)  ? ? Start     Dose/Rate Route Frequency Ordered Stop  ? 07/10/21 0600  cefoTEtan (CEFOTAN) 2 g in sodium chloride 0.9 % 100 mL IVPB       ? 2 g ?200 mL/hr over 30 Minutes Intravenous On call to O.R. 07/10/21 8119 07/10/21 0809  ? ?  ? ? ? ?Assessment/Plan ? POD#4, S/P open lysis of adhesions and colostomy takedown 07/10/21 Dr. Janee Morn ?- afebrile, WBC 18.5 (17.7). afebrile. Recheck am ?- post op ileus, NGT in place, await bowel function ?- IS/pulm toilet, OOB ?- pain: scheduld APAP 1,000 mg q 6h, IV robaxin 1,000 mg q 8h, Oxy 5-10 mg q 4h PRN, prn dilaudid ?-mobilize/pulm toilet ?-monitor for further rectal bleeding to assure no anastomotic bleeding persisting - 3 bloody Bms  over last 24 h ?AKI - resolved ?ABL anemia - hgb 9.3 this am.  1 unit pRBCs 3/23 and 3/24 ?Tachy/HTN - prn hydralazine, scheduled 5mg  lopressor.  HR improved. Continues to be hypertensive ?FEN: NPO/NGT/IVFs ?ID: perioperative cefotan 3/21  ?VTE: SCD's, lovenox  ?Foley: DC foley 3/23 ?Dispo: inpatient trauma service, ileus, telemetry ? ? ?4/23, PA-C ?[facility] ?Please see Amion for pager number during day hours 7:00am-4:30pm ? ? ? ? ? ? ?

## 2021-07-15 ENCOUNTER — Inpatient Hospital Stay (HOSPITAL_COMMUNITY): Payer: Commercial Managed Care - HMO

## 2021-07-15 LAB — CBC
HCT: 25.6 % — ABNORMAL LOW (ref 39.0–52.0)
Hemoglobin: 8.6 g/dL — ABNORMAL LOW (ref 13.0–17.0)
MCH: 28.2 pg (ref 26.0–34.0)
MCHC: 33.6 g/dL (ref 30.0–36.0)
MCV: 83.9 fL (ref 80.0–100.0)
Platelets: 254 10*3/uL (ref 150–400)
RBC: 3.05 MIL/uL — ABNORMAL LOW (ref 4.22–5.81)
RDW: 15 % (ref 11.5–15.5)
WBC: 14.1 10*3/uL — ABNORMAL HIGH (ref 4.0–10.5)
nRBC: 0 % (ref 0.0–0.2)

## 2021-07-15 NOTE — Progress Notes (Signed)
Mobility Specialist: Progress Note ? ? 07/15/21 1536  ?Mobility  ?Activity Ambulated with assistance in hallway  ?Level of Assistance Minimal assist, patient does 75% or more  ?Assistive Device Front wheel walker  ?Distance Ambulated (ft) 70 ft  ?Activity Response Tolerated well  ?$Mobility charge 1 Mobility  ? ?Pt received in bed and agreeable to mobility. C/o abdominal pain during mobility, no rating given. Distance limited to BLE weakness. Pt required frequent verbal cues for RW proximity and for correcting posture during ambulation. Pt to bed after session with all needs met.  ? ?Alejandro Shelton ?Mobility Specialist ?Mobility Specialist [place]: [phone] ?Mobility Specialist [place]: [phone] ? ?

## 2021-07-15 NOTE — Progress Notes (Signed)
[facility] ?Progress Note ? ?5 Days Post-Op  ?Subjective: ?Had a very difficult night with pain and increasing bloating. Felt like his NGT was clogged which was assessed by nursing. Discussed with RN and per sign out NGT was assessed and functioning well overnight. Still passing some flatus but belching more. 2 bloody/mucous bowel movements yesterday. Has not been ambulating in hallway. ? ?Objective: ?Vital signs in last 24 hours: ?Temp:  [98.1 ?F (36.7 ?C)-98.9 ?F (37.2 ?C)] 98.1 ?F (36.7 ?C) (03/25 2352) ?Pulse Rate:  [78-88] 88 (03/26 0521) ?Resp:  [16-19] 19 (03/25 2352) ?BP: (144-158)/(91-110) 149/101 (03/26 0521) ?SpO2:  [100 %] 100 % (03/26 0521) ?Last BM Date : 07/13/21 ? ?Intake/Output from previous day: ?03/25 0701 - 03/26 0700 ?In: 25 [NG/GT:25] ?Out: 400 [Emesis/NG output:400] ?Intake/Output this shift: ?No intake/output data recorded. ? ?PE: ?Gen:  Alert, NAD, pleasant and cooperative ?Card:  Regular rate and rhythm ?Pulm:  Normal effort on room air ?Abd: mild to moderate distension.  Hypoactive BS, NGT with 400cc of output since yesterday per chart - bilious. NGT flushed by me and functioning well. Midline incisions are dry and intact with ecchymosis surrounding both, honeycomb dressing c/d/i.  No active bleeding noted.  Staples present. ?MSK: calves soft, nonedematous, and NT to palpation ?Skin: warm and dry, no rashes  ?Psych: A&Ox3  ? ?Lab Results:  ?Recent Labs  ?  07/14/21 ?[id number] 07/15/21 ?[id number]  ?WBC 18.5* 14.1*  ?HGB 9.3* 8.6*  ?HCT 27.1* 25.6*  ?PLT 184 254  ? ? ?BMET ?Recent Labs  ?  07/13/21 ?0132  ?NA 133*  ?K 3.8  ?CL 98  ?CO2 25  ?GLUCOSE 102*  ?BUN 12  ?CREATININE 0.94  ?CALCIUM 8.4*  ? ? ?PT/INR ?No results for input(s): LABPROT, INR in the last 72 hours. ?CMP  ?   ?Component Value Date/Time  ? NA 133 (L) 07/13/2021 0132  ? NA 138 02/09/2021 1036  ? K 3.8 07/13/2021 0132  ? CL 98 07/13/2021 0132  ? CO2 25 07/13/2021 0132  ? GLUCOSE 102 (H) 07/13/2021 0132  ? BUN 12 07/13/2021  0132  ? BUN 11 02/09/2021 1036  ? CREATININE 0.94 07/13/2021 0132  ? CALCIUM 8.4 (L) 07/13/2021 0132  ? PROT 6.9 10/08/2020 0713  ? ALBUMIN 3.5 10/08/2020 0713  ? AST 88 (H) 10/08/2020 0713  ? ALT 352 (H) 10/08/2020 [id number]  ? ALKPHOS 114 10/08/2020 0713  ? BILITOT 1.4 (H) 10/08/2020 [id number]  ? GFRNONAA >60 07/13/2021 0132  ? GFRAA >90 12/12/2012 0526  ? ?Lipase  ?   ?Component Value Date/Time  ? LIPASE 60 (H) 10/07/2020 2307  ? ? ? ? ? ?Studies/Results: ?No results found. ? ?Anti-infectives: ?Anti-infectives (From admission, onward)  ? ? Start     Dose/Rate Route Frequency Ordered Stop  ? 07/10/21 0600  cefoTEtan (CEFOTAN) 2 g in sodium chloride 0.9 % 100 mL IVPB       ? 2 g ?200 mL/hr over 30 Minutes Intravenous On call to O.R. 07/10/21 [id number] 07/10/21 0809  ? ?  ? ? ? ?Assessment/Plan ? POD#5, S/P open lysis of adhesions and colostomy takedown 07/10/21 Dr. Grandville Silos ?- afebrile, WBC 14.1 (18.5). afebrile ?- post op ileus, NGT in place (400 ml/24H), await bowel function - passing flatus and bloody fluid but belching and distended. WBC improving but continued ileus - will discuss with MD further eval with xray vs CT today ?- IS/pulm toilet, OOB ?- pain: scheduld APAP 1,000 mg q 6h, IV robaxin 1,000 mg q  8h, Oxy 5-10 mg q 4h PRN, prn dilaudid ?-mobilize/pulm toilet ?-monitor for further rectal bleeding to assure no anastomotic bleeding persisting - 2 bloody Bms over last 24 h ?AKI - resolved ?ABL anemia - hgb 8.6 (9.3) this am. 1 unit pRBCs 3/23 and 3/24 ?Tachy/HTN - prn hydralazine, scheduled 5mg  lopressor.  HR improved. Continues to be hypertensive ? ?Encouraged ambulation today in hallway. He states mobility is limited by pain and feeling weak. Will have PT assess ? ?FEN: NPO/NGT/IVFs ?ID: perioperative cefotan 3/21  ?VTE: SCD's, lovenox  ?Foley: DC foley 3/23 ?Dispo: inpatient trauma service, ileus, telemetry ? ? ?Winferd Humphrey, PA-C ?[facility] ?Please see Amion for pager number during day hours  7:00am-4:30pm ? ? ? ? ? ? ?

## 2021-07-15 NOTE — Progress Notes (Signed)
PT Cancellation Note ? ?Patient Details ?Name: Alejandro Shelton ?MRN: [id number] ?DOB: [birth year] ? ? ?Cancelled Treatment:    Reason Eval/Treat Not Completed: Patient declined, no reason specified. Pt reports he does not feel like walking now. PT provides education on the importance of mobility due to weakness and the need for bowel stimulation after colostomy takedown. Pt continues to refuse. PT will follow up tomorrow. ? ? ?Zenaida Niece ?07/15/2021, 5:07 PM ?

## 2021-07-15 NOTE — Plan of Care (Signed)
?  Problem: Nutrition: ?Goal: Adequate nutrition will be maintained ?Outcome: Not Progressing ?  ?Problem: Pain Managment: ?Goal: General experience of comfort will improve ?Outcome: Not Progressing ?  ?Problem: Safety: ?Goal: Ability to remain free from injury will improve ?Outcome: Progressing ?  ?

## 2021-07-16 ENCOUNTER — Inpatient Hospital Stay: Payer: Self-pay

## 2021-07-16 ENCOUNTER — Inpatient Hospital Stay (HOSPITAL_COMMUNITY): Payer: Commercial Managed Care - HMO

## 2021-07-16 DIAGNOSIS — E440 Moderate protein-calorie malnutrition: Secondary | ICD-10-CM | POA: Insufficient documentation

## 2021-07-16 LAB — CBC
HCT: 27.2 % — ABNORMAL LOW (ref 39.0–52.0)
Hemoglobin: 9.3 g/dL — ABNORMAL LOW (ref 13.0–17.0)
MCH: 28.4 pg (ref 26.0–34.0)
MCHC: 34.2 g/dL (ref 30.0–36.0)
MCV: 83.2 fL (ref 80.0–100.0)
Platelets: 325 10*3/uL (ref 150–400)
RBC: 3.27 MIL/uL — ABNORMAL LOW (ref 4.22–5.81)
RDW: 15 % (ref 11.5–15.5)
WBC: 15 10*3/uL — ABNORMAL HIGH (ref 4.0–10.5)
nRBC: 0 % (ref 0.0–0.2)

## 2021-07-16 LAB — BASIC METABOLIC PANEL
Anion gap: 8 (ref 5–15)
BUN: 14 mg/dL (ref 6–20)
CO2: 25 mmol/L (ref 22–32)
Calcium: 8.7 mg/dL — ABNORMAL LOW (ref 8.9–10.3)
Chloride: 99 mmol/L (ref 98–111)
Creatinine, Ser: 0.9 mg/dL (ref 0.61–1.24)
GFR, Estimated: >60 mL/min (ref >60)
Glucose, Bld: 94 mg/dL (ref 70–99)
Potassium: 3.3 mmol/L — ABNORMAL LOW (ref 3.5–5.1)
Sodium: 132 mmol/L — ABNORMAL LOW (ref 135–145)

## 2021-07-16 LAB — GLUCOSE, CAPILLARY
Glucose-Capillary: 105 mg/dL — ABNORMAL HIGH (ref 70–99)
Glucose-Capillary: 108 mg/dL — ABNORMAL HIGH (ref 70–99)
Glucose-Capillary: 84 mg/dL (ref 70–99)

## 2021-07-16 MED ORDER — METHOCARBAMOL 1000 MG/10ML IJ SOLN
1000.0000 mg | Freq: Four times a day (QID) | INTRAVENOUS | Status: DC
  Administered 2021-07-16 – 2021-07-23 (×27): 1000 mg via INTRAVENOUS
  Filled 2021-07-16: qty 10
  Filled 2021-07-16: qty 1000
  Filled 2021-07-16: qty 10
  Filled 2021-07-16 (×6): qty 1000
  Filled 2021-07-16 (×2): qty 10
  Filled 2021-07-16 (×3): qty 1000
  Filled 2021-07-16: qty 10
  Filled 2021-07-16: qty 1000
  Filled 2021-07-16: qty 10
  Filled 2021-07-16: qty 1000
  Filled 2021-07-16: qty 10
  Filled 2021-07-16: qty 1000
  Filled 2021-07-16 (×2): qty 10
  Filled 2021-07-16 (×4): qty 1000
  Filled 2021-07-16: qty 10
  Filled 2021-07-16: qty 1000

## 2021-07-16 MED ORDER — ACETAMINOPHEN 10 MG/ML IV SOLN
1000.0000 mg | Freq: Four times a day (QID) | INTRAVENOUS | Status: AC
  Administered 2021-07-16 (×3): 1000 mg via INTRAVENOUS
  Filled 2021-07-16 (×3): qty 100

## 2021-07-16 MED ORDER — TRAVASOL 10 % IV SOLN
INTRAVENOUS | Status: AC
  Filled 2021-07-16 (×4): qty 510

## 2021-07-16 MED ORDER — CHLORHEXIDINE GLUCONATE CLOTH 2 % EX PADS
6.0000 | MEDICATED_PAD | Freq: Every day | CUTANEOUS | Status: DC
  Administered 2021-07-17 – 2021-07-24 (×8): 6 via TOPICAL

## 2021-07-16 MED ORDER — SODIUM CHLORIDE 0.45 % IV SOLN: INTRAVENOUS | Status: AC

## 2021-07-16 MED ORDER — INSULIN ASPART 100 UNIT/ML IJ SOLN
0.0000 [IU] | Freq: Four times a day (QID) | INTRAMUSCULAR | Status: DC
  Administered 2021-07-17 – 2021-07-18 (×2): 1 [IU] via SUBCUTANEOUS

## 2021-07-16 MED ORDER — IOHEXOL 300 MG/ML  SOLN
100.0000 mL | Freq: Once | INTRAMUSCULAR | Status: AC | PRN
  Administered 2021-07-16: 100 mL via INTRAVENOUS

## 2021-07-16 MED ORDER — IOHEXOL 9 MG/ML PO SOLN
ORAL | Status: AC
  Administered 2021-07-16: 500 mL
  Filled 2021-07-16: qty 1000

## 2021-07-16 MED ORDER — SODIUM CHLORIDE 0.9% FLUSH
10.0000 mL | Freq: Two times a day (BID) | INTRAVENOUS | Status: DC
  Administered 2021-07-17 – 2021-07-23 (×12): 10 mL

## 2021-07-16 MED ORDER — SODIUM CHLORIDE 0.9% FLUSH: 10.0000 mL | INTRAVENOUS | Status: DC | PRN

## 2021-07-16 NOTE — Progress Notes (Signed)
? ?Progress Note ? ?6 Days Post-Op  ?Subjective: ?Pt reports bloody BM again this AM. Denies much flatus. Still having some bloating despite NGT and pain. Was able to mobilize some with walker yesterday. Reports no hx of LE weakness just prior to surgery.  ? ?Objective: ?Vital signs in last 24 hours: ?Temp:  [97.5 ?F (36.4 ?C)-99.4 ?F (37.4 ?C)] 97.5 ?F (36.4 ?C) (03/27 2725) ?Pulse Rate:  [77-88] 88 (03/27 0844) ?Resp:  [17-18] 18 (03/27 0844) ?BP: (138-163)/(94-100) 163/97 (03/27 0844) ?SpO2:  [100 %] 100 % (03/27 0844) ?Last BM Date : 07/14/21 ? ?Intake/Output from previous day: ?03/26 0701 - 03/27 0700 ?In: -  ?Out: 1150 [Urine:600; Emesis/NG output:550] ?Intake/Output this shift: ?No intake/output data recorded. ? ?PE: ?General: pleasant, WD, WN male who is laying in bed in NAD ?Heart: regular, rate, and rhythm.   ?Lungs: Respiratory effort nonlabored ?Abd: soft, appropriately ttp, incisions C/D/I with staples present, NGT with bilious drainage  ?MS: all 4 extremities are symmetrical with no cyanosis, clubbing, or edema. ?Psych: A&Ox3 with an appropriate affect.  ? ? ?Lab Results:  ?Recent Labs  ?  07/15/21 ?0218 07/16/21 ?0132  ?WBC 14.1* 15.0*  ?HGB 8.6* 9.3*  ?HCT 25.6* 27.2*  ?PLT 254 325  ? ?BMET ?Recent Labs  ?  07/16/21 ?0132  ?NA 132*  ?K 3.3*  ?CL 99  ?CO2 25  ?GLUCOSE 94  ?BUN 14  ?CREATININE 0.90  ?CALCIUM 8.7*  ? ?PT/INR ?No results for input(s): LABPROT, INR in the last 72 hours. ?CMP  ?   ?Component Value Date/Time  ? NA 132 (L) 07/16/2021 0132  ? NA 138 02/09/2021 1036  ? K 3.3 (L) 07/16/2021 0132  ? CL 99 07/16/2021 0132  ? CO2 25 07/16/2021 0132  ? GLUCOSE 94 07/16/2021 0132  ? BUN 14 07/16/2021 0132  ? BUN 11 02/09/2021 1036  ? CREATININE 0.90 07/16/2021 0132  ? CALCIUM 8.7 (L) 07/16/2021 0132  ? PROT 6.9 10/08/2020 0713  ? ALBUMIN 3.5 10/08/2020 0713  ? AST 88 (H) 10/08/2020 0713  ? ALT 352 (H) 10/08/2020 3664  ? ALKPHOS 114 10/08/2020 0713  ? BILITOT 1.4 (H) 10/08/2020 4034  ? GFRNONAA >60  07/16/2021 0132  ? GFRAA >90 12/12/2012 0526  ? ?Lipase  ?   ?Component Value Date/Time  ? LIPASE 60 (H) 10/07/2020 2307  ? ? ? ? ? ?Studies/Results: ?DG Abd 2 Views ? ?Result Date: 07/15/2021 ?CLINICAL DATA:  Status post colostomy takedown. EXAM: ABDOMEN - 2 VIEW COMPARISON:  07/11/2021 and older exams. FINDINGS: Multiple dilated small bowel loops, increased when compared to the prior radiographs. Multiple air-fluid levels on the erect view. No free air. Nasogastric tube tip projects in the distal stomach. Midline and right mid abdomen skin staples are stable. IMPRESSION: 1. Significant increase in small-bowel dilation, now with multiple air-fluid levels. Findings suspicious for a small-bowel obstruction. Diffuse adynamic ileus is also in the differential diagnosis. 2. No free air. Electronically Signed   By: Amie Portland M.D.   On: 07/15/2021 15:16  ? ?[place] EKG SITE RITE ? ?Result Date: 07/16/2021 ?If [employer] not attached, placement could not be confirmed due to current cardiac rhythm.  ? ?Anti-infectives: ?Anti-infectives (From admission, onward)  ? ? Start     Dose/Rate Route Frequency Ordered Stop  ? 07/10/21 0600  cefoTEtan (CEFOTAN) 2 g in sodium chloride 0.9 % 100 mL IVPB       ? 2 g ?200 mL/hr over 30 Minutes Intravenous On call to  O.R. 07/10/21 0554 07/10/21 0809  ? ?  ? ? ? ?Assessment/Plan ?POD#6, S/P open lysis of adhesions and colostomy takedown 07/10/21 Dr. Janee Morn ?- afebrile, WBC 15 (18.5). afebrile ?- post op ileus, NGT in place (~950 ml/24H and bilious), not passing much flatus, still having some bloody diarrhea - suspect anastomotic bleeding ?- IS/pulm toilet, OOB ?- pain: scheduld IV tylenol 1,000 mg q 6h, IV robaxin 1,000 mg q 6h, Oxy 5-10 mg q 4h PRN, prn dilaudid ?- mobilize/pulm toilet ?- CT AP with PO and IV contrast today ?- PICC/TPN ordered  ?AKI - resolved ?ABL anemia - hgb stable at 9.3 this AM. 1 unit pRBCs 3/23 and 3/24 ?Tachy/HTN - prn hydralazine, scheduled 5mg  lopressor.  HR  improved. Continues to be hypertensive ?  ?Encouraged ambulation today in hallway. He states mobility is limited by pain and feeling weak, PT was unable to assess yesterday but he did mobilize some with walker.  ?  ?FEN: NPO, NGT to LIWS, IVF @100  cc/h; PICC/TPN ordered ?ID: perioperative cefotan 3/21  ?VTE: SCD's, lovenox  ?Foley: removed 3/23, voiding  ? ?Dispo: CT AP today . PICC/TPN ? LOS: 6 days  ? ? ? ?4/21, PA-C ?[facility] ?07/16/2021, 9:47 AM ?Please see Amion for pager number during day hours 7:00am-4:30pm ? ?

## 2021-07-16 NOTE — Progress Notes (Signed)
Peripherally Inserted Central Catheter Placement ? ?The IV Nurse has discussed with the patient and/or persons authorized to consent for the patient, the purpose of this procedure and the potential benefits and risks involved with this procedure.  The benefits include less needle sticks, lab draws from the catheter, and the patient may be discharged home with the catheter. Risks include, but not limited to, infection, bleeding, blood clot (thrombus formation), and puncture of an artery; nerve damage and irregular heartbeat and possibility to perform a PICC exchange if needed/ordered by physician.  Alternatives to this procedure were also discussed.  Bard Power PICC patient education guide, fact sheet on infection prevention and patient information card has been provided to patient /or left at bedside.   ? ?PICC Placement Documentation  ?PICC Double Lumen [id number] Right Basilic 39 cm 0 cm (Active)  ?Indication for Insertion or Continuance of Line Administration of hyperosmolar/irritating solutions (i.e. TPN, Vancomycin, etc.) 07/16/21 1745  ?Site Assessment Clean, Dry, Intact 07/16/21 1745  ?Lumen #1 Status Flushed;Saline locked;Blood return noted 07/16/21 1745  ?Lumen #2 Status Flushed;Saline locked;Blood return noted 07/16/21 1745  ?Dressing Type Transparent;Securing device 07/16/21 1745  ?Dressing Status Antimicrobial disc in place;Clean, Dry, Intact 07/16/21 1745  ?Safety Lock Not Applicable [id number]  ?Dressing Intervention New dressing;Other (Comment) 07/16/21 1745  ?Dressing Change Due 07/23/21 07/16/21 1745  ? ? ? ? ? ?Enos Fling ?07/16/2021, 5:47 PM ? ?

## 2021-07-16 NOTE — Progress Notes (Signed)
PT Cancellation Note ? ?Patient Details ?Name: Alejandro Shelton ?MRN: [id number] ?DOB: 1985-11-09 ? ? ?Cancelled Treatment:    Reason Eval/Treat Not Completed: Patient declined, no reason specified. PT coordinated evaluation attempt with pain meds, however pt continues to refuse at this time. Encouraged OOB mobility and pt states he walked to the bathroom this morning, and will not get up again until 2:00 pm. This PT not able to return at 2:00 pm however alerted mobility specialist, Harrell Gave of pt's request for timing. Will continue to follow and initiate PT evaluation when pt agreeable.  ? ? ?Thelma Comp ?07/16/2021, 11:53 AM ? ?Rolinda Roan, PT, DPT ?[facility] ?Pager: [phone] ?Office: [phone] ? ?
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
## 2021-07-17 LAB — GLUCOSE, CAPILLARY
Glucose-Capillary: 117 mg/dL — ABNORMAL HIGH (ref 70–99)
Glucose-Capillary: 105 mg/dL — ABNORMAL HIGH (ref 70–99)
Glucose-Capillary: 132 mg/dL — ABNORMAL HIGH (ref 70–99)

## 2021-07-17 LAB — CBC
HCT: 26.8 % — ABNORMAL LOW (ref 39.0–52.0)
Hemoglobin: 9.1 g/dL — ABNORMAL LOW (ref 13.0–17.0)
MCH: 28.3 pg (ref 26.0–34.0)
MCHC: 34 g/dL (ref 30.0–36.0)
MCV: 83.2 fL (ref 80.0–100.0)
Platelets: 376 10*3/uL (ref 150–400)
RBC: 3.22 MIL/uL — ABNORMAL LOW (ref 4.22–5.81)
RDW: 15.3 % (ref 11.5–15.5)
WBC: 14.4 10*3/uL — ABNORMAL HIGH (ref 4.0–10.5)
nRBC: 0 % (ref 0.0–0.2)

## 2021-07-17 LAB — COMPREHENSIVE METABOLIC PANEL
ALT: 24 U/L (ref 0–44)
AST: 25 U/L (ref 15–41)
Albumin: 3.2 g/dL — ABNORMAL LOW (ref 3.5–5.0)
Alkaline Phosphatase: 45 U/L (ref 38–126)
Anion gap: 7 (ref 5–15)
BUN: 8 mg/dL (ref 6–20)
CO2: 29 mmol/L (ref 22–32)
Calcium: 8.6 mg/dL — ABNORMAL LOW (ref 8.9–10.3)
Chloride: 96 mmol/L — ABNORMAL LOW (ref 98–111)
Creatinine, Ser: 0.81 mg/dL (ref 0.61–1.24)
GFR, Estimated: >60 mL/min (ref >60)
Glucose, Bld: 111 mg/dL — ABNORMAL HIGH (ref 70–99)
Potassium: 3.2 mmol/L — ABNORMAL LOW (ref 3.5–5.1)
Sodium: 132 mmol/L — ABNORMAL LOW (ref 135–145)
Total Bilirubin: 1.3 mg/dL — ABNORMAL HIGH (ref 0.3–1.2)
Total Protein: 6.4 g/dL — ABNORMAL LOW (ref 6.5–8.1)

## 2021-07-17 LAB — TRIGLYCERIDES: Triglycerides: 80 mg/dL (ref <150)

## 2021-07-17 LAB — PHOSPHORUS: Phosphorus: 3.4 mg/dL (ref 2.5–4.6)

## 2021-07-17 LAB — MAGNESIUM: Magnesium: 2 mg/dL (ref 1.7–2.4)

## 2021-07-17 MED ORDER — POTASSIUM CHLORIDE 10 MEQ/100ML IV SOLN
10.0000 meq | INTRAVENOUS | Status: AC
  Administered 2021-07-17 (×5): 10 meq via INTRAVENOUS
  Filled 2021-07-17 (×5): qty 100

## 2021-07-17 MED ORDER — ACETAMINOPHEN 10 MG/ML IV SOLN
1000.0000 mg | Freq: Four times a day (QID) | INTRAVENOUS | Status: AC
  Administered 2021-07-17 – 2021-07-18 (×3): 1000 mg via INTRAVENOUS
  Filled 2021-07-17 (×3): qty 100

## 2021-07-17 MED ORDER — METOPROLOL TARTRATE 5 MG/5ML IV SOLN
7.5000 mg | Freq: Four times a day (QID) | INTRAVENOUS | Status: DC
Start: 2021-07-17 — End: 2021-07-23
  Administered 2021-07-17 – 2021-07-23 (×24): 7.5 mg via INTRAVENOUS
  Filled 2021-07-17 (×23): qty 10

## 2021-07-17 MED ORDER — BISACODYL 10 MG RE SUPP: 10.0000 mg | Freq: Every day | RECTAL | Status: DC | PRN

## 2021-07-17 MED ORDER — TRAVASOL 10 % IV SOLN
INTRAVENOUS | Status: AC
  Filled 2021-07-17: qty 1152
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
## 2021-07-17 NOTE — Progress Notes (Signed)
Mobility Specialist: Progress Note ? ? 07/17/21 1655  ?Mobility  ?Activity Refused mobility  ? ?Pt refused mobility d/t abdominal bloating/pain. Pt stated he walked two laps with PT earlier today. Will f/u as able.  ? ?Cristal Deer Krishav Mamone ?Mobility Specialist ?Mobility Specialist [address]: [phone] ?Mobility Specialist 6 [address]: [phone] ? ?

## 2021-07-17 NOTE — Plan of Care (Signed)
?  Problem: Education: ?Goal: Required Educational Video(s) ?Outcome: Progressing ?  ?Problem: Clinical Measurements: ?Goal: Ability to maintain clinical measurements within normal limits will improve ?Outcome: Progressing ?Goal: Postoperative complications will be avoided or minimized ?Outcome: Progressing ?  ?Problem: Skin Integrity: ?Goal: Demonstration of wound healing without infection will improve ?Outcome: Progressing ?  ?Problem: Education: ?Goal: Knowledge of General Education information will improve ?Description: Including pain rating scale, medication(s)/side effects and non-pharmacologic comfort measures ?Outcome: Progressing ?  ?Problem: Clinical Measurements: ?Goal: Ability to maintain clinical measurements within normal limits will improve ?Outcome: Progressing ?Goal: Will remain free from infection ?Outcome: Progressing ?Goal: Diagnostic test results will improve ?Outcome: Progressing ?Goal: Respiratory complications will improve ?Outcome: Progressing ?Goal: Cardiovascular complication will be avoided ?Outcome: Progressing ?  ?Problem: Activity: ?Goal: Risk for activity intolerance will decrease ?Outcome: Progressing ?  ?Problem: Nutrition: ?Goal: Adequate nutrition will be maintained ?Outcome: Not Progressing ?  ?Problem: Coping: ?Goal: Level of anxiety will decrease ?Outcome: Progressing ?  ?Problem: Elimination: ?Goal: Will not experience complications related to bowel motility ?Outcome: Progressing ?Goal: Will not experience complications related to urinary retention ?Outcome: Progressing ?  ?Problem: Pain Managment: ?Goal: General experience of comfort will improve ?Outcome: Not Progressing ?  ?Problem: Safety: ?Goal: Ability to remain free from injury will improve ?Outcome: Progressing ?  ?Problem: Skin Integrity: ?Goal: Risk for impaired skin integrity will decrease ?Outcome: Progressing ?  ?

## 2021-07-18 ENCOUNTER — Inpatient Hospital Stay (HOSPITAL_COMMUNITY): Payer: Commercial Managed Care - HMO

## 2021-07-18 LAB — CBC
HCT: 29.5 % — ABNORMAL LOW (ref 39.0–52.0)
Hemoglobin: 9.7 g/dL — ABNORMAL LOW (ref 13.0–17.0)
MCH: 27.9 pg (ref 26.0–34.0)
MCHC: 32.9 g/dL (ref 30.0–36.0)
MCV: 84.8 fL (ref 80.0–100.0)
Platelets: 411 10*3/uL — ABNORMAL HIGH (ref 150–400)
RBC: 3.48 MIL/uL — ABNORMAL LOW (ref 4.22–5.81)
RDW: 15.5 % (ref 11.5–15.5)
WBC: 13.6 10*3/uL — ABNORMAL HIGH (ref 4.0–10.5)
nRBC: 0 % (ref 0.0–0.2)

## 2021-07-18 LAB — BASIC METABOLIC PANEL
Anion gap: 11 (ref 5–15)
BUN: 11 mg/dL (ref 6–20)
CO2: 24 mmol/L (ref 22–32)
Calcium: 8.7 mg/dL — ABNORMAL LOW (ref 8.9–10.3)
Chloride: 96 mmol/L — ABNORMAL LOW (ref 98–111)
Creatinine, Ser: 0.85 mg/dL (ref 0.61–1.24)
GFR, Estimated: >60 mL/min (ref >60)
Glucose, Bld: 99 mg/dL (ref 70–99)
Potassium: 3.7 mmol/L (ref 3.5–5.1)
Sodium: 131 mmol/L — ABNORMAL LOW (ref 135–145)

## 2021-07-18 LAB — GLUCOSE, CAPILLARY
Glucose-Capillary: 100 mg/dL — ABNORMAL HIGH (ref 70–99)
Glucose-Capillary: 107 mg/dL — ABNORMAL HIGH (ref 70–99)
Glucose-Capillary: 117 mg/dL — ABNORMAL HIGH (ref 70–99)
Glucose-Capillary: 127 mg/dL — ABNORMAL HIGH (ref 70–99)

## 2021-07-18 LAB — TRIGLYCERIDES: Triglycerides: 89 mg/dL (ref <150)

## 2021-07-18 LAB — MAGNESIUM: Magnesium: 2 mg/dL (ref 1.7–2.4)

## 2021-07-18 LAB — PHOSPHORUS: Phosphorus: 3.6 mg/dL (ref 2.5–4.6)

## 2021-07-18 MED ORDER — POTASSIUM CHLORIDE 10 MEQ/100ML IV SOLN
10.0000 meq | INTRAVENOUS | Status: DC
  Administered 2021-07-18 (×2): 10 meq via INTRAVENOUS
  Filled 2021-07-18 (×2): qty 100

## 2021-07-18 MED ORDER — POTASSIUM CHLORIDE 10 MEQ/100ML IV SOLN
10.0000 meq | INTRAVENOUS | Status: AC
  Administered 2021-07-18 (×2): 10 meq via INTRAVENOUS
  Filled 2021-07-18 (×2): qty 100

## 2021-07-18 MED ORDER — TRAVASOL 10 % IV SOLN: INTRAVENOUS | Status: DC

## 2021-07-18 MED ORDER — ACETAMINOPHEN 10 MG/ML IV SOLN
1000.0000 mg | Freq: Four times a day (QID) | INTRAVENOUS | Status: AC
  Administered 2021-07-18 – 2021-07-19 (×3): 1000 mg via INTRAVENOUS
  Filled 2021-07-18 (×3): qty 100

## 2021-07-18 MED ORDER — ENOXAPARIN SODIUM 30 MG/0.3ML IJ SOSY
30.0000 mg | PREFILLED_SYRINGE | Freq: Two times a day (BID) | INTRAMUSCULAR | Status: DC
  Administered 2021-07-18 – 2021-07-24 (×10): 30 mg via SUBCUTANEOUS
  Filled 2021-07-18 (×12): qty 0.3

## 2021-07-18 MED ORDER — BENZOCAINE 20 % MT AERO
INHALATION_SPRAY | Freq: Three times a day (TID) | OROMUCOSAL | Status: DC | PRN
  Filled 2021-07-18: qty 57

## 2021-07-18 MED ORDER — INSULIN ASPART 100 UNIT/ML IJ SOLN: 0.0000 [IU] | Freq: Four times a day (QID) | INTRAMUSCULAR | Status: DC

## 2021-07-18 MED ORDER — TRAVASOL 10 % IV SOLN
INTRAVENOUS | Status: AC
  Filled 2021-07-18: qty 1272

## 2021-07-18 NOTE — Progress Notes (Signed)
? ?Progress Note ? ?8 Days Post-Op  ?Subjective: ?Pt reports feeling a little less distended today. He got up to chair yesterday and walked in hallway more. He is still not passing much gas but having some diarrhea like stool. Per RN dark and also some more frankly bloody appearing material. Pt complains of discomfort from NGT in throat, already using chloraseptic and lozenges.   ? ?Objective: ?Vital signs in last 24 hours: ?Temp:  [97.7 ?F (36.5 ?C)-98.2 ?F (36.8 ?C)] 98.2 ?F (36.8 ?C) (03/29 0431) ?Pulse Rate:  [82-94] 87 (03/29 0431) ?Resp:  [15-17] 16 (03/29 0431) ?BP: (136-150)/(88-99) 136/89 (03/29 0431) ?SpO2:  [100 %] 100 % (03/29 0431) ?Weight:  [74.8 kg] 74.8 kg (03/29 0431) ?Last BM Date : 07/17/21 ? ?Intake/Output from previous day: ?03/28 0701 - 03/29 0700 ?In: 2171 [I.V.:1024.6; NG/GT:150; IV Piggyback:996.4] ?Out: 1500 [Urine:700; Emesis/NG output:800] ?Intake/Output this shift: ?No intake/output data recorded. ? ?PE: ?General: pleasant, WD, WN male who is laying in bed in NAD ?Heart: regular, rate, and rhythm.   ?Lungs: Respiratory effort nonlabored ?Abd: soft, distended, appropriately ttp, incisions C/D/I with staples present, NGT with bilious drainage, few tinkling BS ?MS: all 4 extremities are symmetrical with no cyanosis, clubbing, or edema. ?Psych: A&Ox3 with an anxious affect.  ? ? ?Lab Results:  ?Recent Labs  ?  07/17/21 ?0414 07/18/21 ?0050  ?WBC 14.4* 13.6*  ?HGB 9.1* 9.7*  ?HCT 26.8* 29.5*  ?PLT 376 411*  ? ? ?BMET ?Recent Labs  ?  07/17/21 ?0414 07/18/21 ?0050  ?NA 132* 131*  ?K 3.2* 3.7  ?CL 96* 96*  ?CO2 29 24  ?GLUCOSE 111* 99  ?BUN 8 11  ?CREATININE 0.81 0.85  ?CALCIUM 8.6* 8.7*  ? ? ?PT/INR ?No results for input(s): LABPROT, INR in the last 72 hours. ?CMP  ?   ?Component Value Date/Time  ? NA 131 (L) 07/18/2021 0050  ? NA 138 02/09/2021 1036  ? K 3.7 07/18/2021 0050  ? CL 96 (L) 07/18/2021 0050  ? CO2 24 07/18/2021 0050  ? GLUCOSE 99 07/18/2021 0050  ? BUN 11 07/18/2021 0050  ? BUN  11 02/09/2021 1036  ? CREATININE 0.85 07/18/2021 0050  ? CALCIUM 8.7 (L) 07/18/2021 0050  ? PROT 6.4 (L) 07/17/2021 0414  ? ALBUMIN 3.2 (L) 07/17/2021 0414  ? AST 25 07/17/2021 0414  ? ALT 24 07/17/2021 0414  ? ALKPHOS 45 07/17/2021 0414  ? BILITOT 1.3 (H) 07/17/2021 0414  ? GFRNONAA >60 07/18/2021 0050  ? GFRAA >90 12/12/2012 0526  ? ?Lipase  ?   ?Component Value Date/Time  ? LIPASE 60 (H) 10/07/2020 2307  ? ? ? ? ? ?Studies/Results: ?CT ABDOMEN PELVIS W CONTRAST ? ?Result Date: 07/16/2021 ?CLINICAL DATA:  Postop abdominal pain. EXAM: CT ABDOMEN AND PELVIS WITH CONTRAST TECHNIQUE: Multidetector CT imaging of the abdomen and pelvis was performed using the standard protocol following bolus administration of intravenous contrast. RADIATION DOSE REDUCTION: This exam was performed according to the departmental dose-optimization program which includes automated exposure control, adjustment of the mA and/or kV according to patient size and/or use of iterative reconstruction technique. CONTRAST:  116mL OMNIPAQUE IOHEXOL 300 MG/ML  SOLN COMPARISON:  10/08/2020 and 08/18/2020. FINDINGS: Lower chest: Mild subsegmental volume loss in lower lobes. Heart size normal. No pericardial or pleural effusion. Nasogastric tube is seen in distal esophagus. Hepatobiliary: Liver and gallbladder are unremarkable. No biliary ductal dilatation. Pancreas: Negative. Spleen: Negative. Adrenals/Urinary Tract: Adrenal glands are unremarkable. Right kidney is scarred from previous trauma, as on  08/18/2020. Ureters are decompressed. Bladder is grossly unremarkable. Stomach/Bowel: Nasogastric tube terminates in the stomach. There is fluid-filled and distended proximal and mid small bowel with possible abrupt decompression in the right abdomen. Presumed unopacified collapsed small bowel in the central abdomen (3/53). Interval right lower quadrant colostomy reversal. Colon is decompressed. Vascular/Lymphatic: Vascular structures are unremarkable.  Retroperitoneal lymph nodes are not enlarged by CT size criteria. Reproductive: Prostate is visualized. Other: Moderate ascites. Residual postoperative free air in the ventral abdominal midline. Musculoskeletal: Old right rib fracture. IMPRESSION: 1. Markedly dilated and fluid-filled proximal and mid small bowel with a possible transition point in the right mid abdomen, findings indicative of a small bowel obstruction. Interval colostomy reversal. Distal small bowel and colon are decompressed. 2. Moderate ascites. 3. Posttraumatic scarring in the right kidney. Electronically Signed   By: Lorin Picket M.D.   On: 07/16/2021 16:45  ? ?[place] EKG SITE RITE ? ?Result Date: 07/16/2021 ?If [employer] not attached, placement could not be confirmed due to current cardiac rhythm.  ? ?Anti-infectives: ?Anti-infectives (From admission, onward)  ? ? Start     Dose/Rate Route Frequency Ordered Stop  ? 07/10/21 0600  cefoTEtan (CEFOTAN) 2 g in sodium chloride 0.9 % 100 mL IVPB       ? 2 g ?200 mL/hr over 30 Minutes Intravenous On call to O.R. 07/10/21 [id number] 07/10/21 0809  ? ?  ? ? ? ?Assessment/Plan ?POD#8, S/P open lysis of adhesions and colostomy takedown 07/10/21 Dr. Grandville Silos ?- afebrile, WBC 13.6 - suspect mild leukocytosis is more reactive from ileus rather than infectious ?- post op ileus, NGT in place (~800 ml/24H and bilious), not passing flatus, still having some bloody diarrhea - suspect anastomotic bleeding ?- IS/pulm toilet, OOB ?- pain: scheduld IV tylenol 1,000 mg q 6h, IV robaxin 1,000 mg q 6h, Oxy 5-10 mg q 4h PRN, prn dilaudid ?- mobilize/pulm toilet ?- CT AP 3/27 with SBO vs ileus, ascites, no abscess ?- PICC/TPN started 3/27, continue this  ?AKI - resolved ?ABL anemia - hgb stable at 9.7 this AM. 1 unit pRBCs 3/23 and 3/24 ?Tachy/HTN - prn hydralazine, continue scheduled lopressor 7.5 mg. BP improving  ?Mild hypokalemia - K is 3.7, improving, recommend replacement to goal of 4.0 to help optimize bowel function   ?  ?FEN: NPO, NGT to LIWS; PICC/TPN  ?ID: perioperative cefotan 3/21  ?VTE: SCD's, lovenox held ?Foley: removed 3/23, voiding  ? ?Dispo: continue to encourage mobilization and await return in bowel function. Continue TPN. Hgb stable but pt still having bloody stools - will discuss whether to resume LMWH with MD.  ? LOS: 8 days  ? ? ? ?Norm Parcel, PA-C ?[facility] ?07/18/2021, 7:53 AM ?Please see Amion for pager number during day hours 7:00am-4:30pm ? ?
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
## 2021-07-18 NOTE — Progress Notes (Signed)
Mobility Specialist: Progress Note ? ? 07/18/21 1038  ?Mobility  ?Activity Ambulated with assistance in hallway  ?Level of Assistance Contact guard assist, steadying assist  ?Assistive Device  ?(IV pole)  ?Distance Ambulated (ft) 440 ft  ?Activity Response Tolerated well  ?$Mobility charge 1 Mobility  ? ?Pt received in bed and agreeable to ambulation. C/o abdominal pain throughout, no rating given. Pt otherwise asymptomatic throughout. Stopped x1 for short seated break. Pt back to bed after session with call bell and phone in reach.  ? ?Alejandro Shelton ?Mobility Specialist ?Mobility Specialist [place]: [phone] ?Mobility Specialist [place]: [phone] ? ?

## 2021-07-18 NOTE — Progress Notes (Signed)
Mobility Specialist: Progress Note ? ? 07/18/21 1734  ?Mobility  ?Activity Ambulated with assistance in hallway  ?Level of Assistance Modified independent, requires aide device or extra time  ?Assistive Device  ?(IV pole)  ?Distance Ambulated (ft) 500 ft  ?Activity Response Tolerated well  ?$Mobility charge 1 Mobility  ? ?Pt received in bed and agreeable to ambulation. C/o abdominal pain as well as his throat hurting when he swallows, RN aware. Some c/o SOB towards end of ambulation. Pt returned to the bed with call bell and phone at his side. Pt requesting pain medication, RN notified.  ? ?Cristal Deer Marguita Venning ?Mobility Specialist ?Mobility Specialist [address]: [phone] ?Mobility Specialist [address]: [phone] ? ?
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
## 2021-07-19 LAB — PHOSPHORUS: Phosphorus: 4 mg/dL (ref 2.5–4.6)

## 2021-07-19 LAB — CBC
HCT: 28.1 % — ABNORMAL LOW (ref 39.0–52.0)
Hemoglobin: 9.2 g/dL — ABNORMAL LOW (ref 13.0–17.0)
MCH: 27.8 pg (ref 26.0–34.0)
MCHC: 32.7 g/dL (ref 30.0–36.0)
MCV: 84.9 fL (ref 80.0–100.0)
Platelets: 501 10*3/uL — ABNORMAL HIGH (ref 150–400)
RBC: 3.31 MIL/uL — ABNORMAL LOW (ref 4.22–5.81)
RDW: 15.6 % — ABNORMAL HIGH (ref 11.5–15.5)
WBC: 13.8 10*3/uL — ABNORMAL HIGH (ref 4.0–10.5)
nRBC: 0 % (ref 0.0–0.2)

## 2021-07-19 LAB — COMPREHENSIVE METABOLIC PANEL
ALT: 20 U/L (ref 0–44)
AST: 26 U/L (ref 15–41)
Albumin: 3.2 g/dL — ABNORMAL LOW (ref 3.5–5.0)
Alkaline Phosphatase: 44 U/L (ref 38–126)
Anion gap: 8 (ref 5–15)
BUN: 13 mg/dL (ref 6–20)
CO2: 26 mmol/L (ref 22–32)
Calcium: 8.8 mg/dL — ABNORMAL LOW (ref 8.9–10.3)
Chloride: 97 mmol/L — ABNORMAL LOW (ref 98–111)
Creatinine, Ser: 0.83 mg/dL (ref 0.61–1.24)
GFR, Estimated: >60 mL/min (ref >60)
Glucose, Bld: 117 mg/dL — ABNORMAL HIGH (ref 70–99)
Potassium: 4 mmol/L (ref 3.5–5.1)
Sodium: 131 mmol/L — ABNORMAL LOW (ref 135–145)
Total Bilirubin: 0.6 mg/dL (ref 0.3–1.2)
Total Protein: 6.5 g/dL (ref 6.5–8.1)

## 2021-07-19 LAB — GLUCOSE, CAPILLARY
Glucose-Capillary: 103 mg/dL — ABNORMAL HIGH (ref 70–99)
Glucose-Capillary: 110 mg/dL — ABNORMAL HIGH (ref 70–99)
Glucose-Capillary: 115 mg/dL — ABNORMAL HIGH (ref 70–99)

## 2021-07-19 LAB — MAGNESIUM: Magnesium: 1.9 mg/dL (ref 1.7–2.4)

## 2021-07-19 MED ORDER — TRAVASOL 10 % IV SOLN
INTRAVENOUS | Status: AC
  Filled 2021-07-19: qty 1272

## 2021-07-19 MED ORDER — ACETAMINOPHEN 10 MG/ML IV SOLN
1000.0000 mg | Freq: Four times a day (QID) | INTRAVENOUS | Status: AC
  Administered 2021-07-19 – 2021-07-20 (×4): 1000 mg via INTRAVENOUS
  Filled 2021-07-19 (×4): qty 100

## 2021-07-19 MED ORDER — MAGNESIUM SULFATE IN D5W 1-5 GM/100ML-% IV SOLN
1.0000 g | Freq: Once | INTRAVENOUS | Status: AC
  Administered 2021-07-19: 1 g via INTRAVENOUS
  Filled 2021-07-19 (×2): qty 100

## 2021-07-19 NOTE — Plan of Care (Signed)
?  Problem: Clinical Measurements: ?Goal: Ability to maintain clinical measurements within normal limits will improve ?Outcome: Progressing ?Goal: Postoperative complications will be avoided or minimized ?Outcome: Progressing ?  ?Problem: Skin Integrity: ?Goal: Demonstration of wound healing without infection will improve ?Outcome: Progressing ?  ?Problem: Education: ?Goal: Knowledge of General Education information will improve ?Description: Including pain rating scale, medication(s)/side effects and non-pharmacologic comfort measures ?Outcome: Progressing ?  ?Problem: Health Behavior/Discharge Planning: ?Goal: Ability to manage health-related needs will improve ?Outcome: Progressing ?  ?Problem: Clinical Measurements: ?Goal: Ability to maintain clinical measurements within normal limits will improve ?Outcome: Progressing ?Goal: Will remain free from infection ?Outcome: Progressing ?Goal: Diagnostic test results will improve ?Outcome: Progressing ?Goal: Respiratory complications will improve ?Outcome: Progressing ?Goal: Cardiovascular complication will be avoided ?Outcome: Progressing ?  ?Problem: Activity: ?Goal: Risk for activity intolerance will decrease ?Outcome: Progressing ?  ?Problem: Coping: ?Goal: Level of anxiety will decrease ?Outcome: Progressing ?  ?Problem: Elimination: ?Goal: Will not experience complications related to bowel motility ?Outcome: Progressing ?Goal: Will not experience complications related to urinary retention ?Outcome: Progressing ?  ?Problem: Pain Managment: ?Goal: General experience of comfort will improve ?Outcome: Progressing ?  ?Problem: Safety: ?Goal: Ability to remain free from injury will improve ?Outcome: Progressing ?  ?Problem: Skin Integrity: ?Goal: Risk for impaired skin integrity will decrease ?Outcome: Progressing ?  ?
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
## 2021-07-19 NOTE — Progress Notes (Signed)
Mobility Specialist: Progress Note ? ? 07/19/21 1456  ?Mobility  ?Activity Ambulated with assistance in hallway  ?Level of Assistance Standby assist, set-up cues, supervision of patient - no hands on  ?Assistive Device Other (Comment) ?(IV pole)  ?Distance Ambulated (ft) 400 ft  ?Activity Response Tolerated well  ?$Mobility charge 1 Mobility  ? ?Pt received in bed and agreeable to ambulation. C/o abdominal pain as well as pain in his throat from NGT, otherwise asymptomatic. Pt stopped x1 to sit and perform STS, then ambulated back to the room. Pt back in bed with call bell and phone at his side.  ? ?Cristal Deer Anntoinette Haefele ?Mobility Specialist ?Mobility Specialist [address]: [phone] ?Mobility Specialist [address]: [phone] ? ?

## 2021-07-19 NOTE — Progress Notes (Signed)
? ?Progress Note ? ?9 Days Post-Op  ?Subjective: ?Pt reports passing a little more gas. Still having bloody BMs. He still feels distended but it is maybe slightly improved. Ambulating more and sitting in bouncy chair. NGT output still bilious.  ? ?Objective: ?Vital signs in last 24 hours: ?Temp:  [97.4 ?F (36.3 ?C)-99.3 ?F (37.4 ?C)] 98.5 ?F (36.9 ?C) (03/30 2025) ?Pulse Rate:  [87-95] 87 (03/30 0807) ?Resp:  [16-18] 18 (03/30 4270) ?BP: (137-149)/(88-99) 137/95 (03/30 6237) ?SpO2:  [100 %] 100 % (03/30 0807) ?Weight:  [76.6 kg] 76.6 kg (03/30 0500) ?Last BM Date : 07/18/21 ? ?Intake/Output from previous day: ?03/29 0701 - 03/30 0700 ?In: 3428.7 [I.V.:2316.5; NG/GT:60; IV Piggyback:1052.2] ?Out: 750 [Emesis/NG output:750] ?Intake/Output this shift: ?No intake/output data recorded. ? ?PE: ?General: pleasant, WD, WN male who is laying in bed in NAD ?Heart: regular, rate, and rhythm.   ?Lungs: Respiratory effort nonlabored ?Abd: soft, distended, appropriately ttp, incisions C/D/I with staples present, NGT with bilious drainage, increased BS this AM and more normal pitch ?MS: all 4 extremities are symmetrical with no cyanosis, clubbing, or edema. ?Psych: A&Ox3 with an anxious affect.  ? ? ?Lab Results:  ?Recent Labs  ?  07/18/21 ?0050 07/19/21 ?0342  ?WBC 13.6* 13.8*  ?HGB 9.7* 9.2*  ?HCT 29.5* 28.1*  ?PLT 411* 501*  ? ? ?BMET ?Recent Labs  ?  07/18/21 ?0050 07/19/21 ?0342  ?NA 131* 131*  ?K 3.7 4.0  ?CL 96* 97*  ?CO2 24 26  ?GLUCOSE 99 117*  ?BUN 11 13  ?CREATININE 0.85 0.83  ?CALCIUM 8.7* 8.8*  ? ? ?PT/INR ?No results for input(s): LABPROT, INR in the last 72 hours. ?CMP  ?   ?Component Value Date/Time  ? NA 131 (L) 07/19/2021 0342  ? NA 138 02/09/2021 1036  ? K 4.0 07/19/2021 0342  ? CL 97 (L) 07/19/2021 0342  ? CO2 26 07/19/2021 0342  ? GLUCOSE 117 (H) 07/19/2021 0342  ? BUN 13 07/19/2021 0342  ? BUN 11 02/09/2021 1036  ? CREATININE 0.83 07/19/2021 0342  ? CALCIUM 8.8 (L) 07/19/2021 0342  ? PROT 6.5 07/19/2021 0342   ? ALBUMIN 3.2 (L) 07/19/2021 0342  ? AST 26 07/19/2021 0342  ? ALT 20 07/19/2021 0342  ? ALKPHOS 44 07/19/2021 0342  ? BILITOT 0.6 07/19/2021 0342  ? GFRNONAA >60 07/19/2021 0342  ? GFRAA >90 12/12/2012 0526  ? ?Lipase  ?   ?Component Value Date/Time  ? LIPASE 60 (H) 10/07/2020 2307  ? ? ? ? ? ?Studies/Results: ?DG Abd Portable 1V ? ?Result Date: 07/18/2021 ?CLINICAL DATA:  Nasogastric tube placement. EXAM: PORTABLE ABDOMEN - 1 VIEW COMPARISON:  July 15, 2021. FINDINGS: Nasogastric tube tip is seen in expected position of proximal stomach. IMPRESSION: Nasogastric tube tip seen in expected position of proximal stomach. Electronically Signed   By: Lupita Raider M.D.   On: 07/18/2021 08:32   ? ?Anti-infectives: ?Anti-infectives (From admission, onward)  ? ? Start     Dose/Rate Route Frequency Ordered Stop  ? 07/10/21 0600  cefoTEtan (CEFOTAN) 2 g in sodium chloride 0.9 % 100 mL IVPB       ? 2 g ?200 mL/hr over 30 Minutes Intravenous On call to O.R. 07/10/21 6283 07/10/21 0809  ? ?  ? ? ? ?Assessment/Plan ?POD#9, S/P open lysis of adhesions and colostomy takedown 07/10/21 Dr. Janee Morn ?- afebrile, WBC 13.8, stable - suspect mild leukocytosis is more reactive from ileus rather than infectious ?- post op ileus, NGT in  place (~750 ml/24H and bilious), passing small amount flatus, still having some bloody diarrhea - suspect anastomotic bleeding ?- IS/pulm toilet, OOB ?- pain: scheduld IV tylenol 1,000 mg q 6h, IV robaxin 1,000 mg q 6h, Oxy 5-10 mg q 4h PRN, prn dilaudid ?- mobilize/pulm toilet ?- CT AP 3/27 with SBO vs ileus, ascites, no abscess ?- PICC/TPN started 3/27, continue this  ?- will recheck this afternoon for possible clamping trials if passing more flatus  ?ABL anemia - hgb stable at 9.2 this AM. 1 unit pRBCs 3/23 and 3/24 ?Tachy/HTN - prn hydralazine, continue scheduled lopressor 7.5 mg. BP improving  ?Mild hypokalemia - K is 4.0, resolved ?Mild hypomagnesemia - Mg 1.9 this AM, replacement ordered ?  ?FEN:  NPO, NGT to LIWS; PICC/TPN  ?ID: perioperative cefotan 3/21  ?VTE: SCD's, lovenox restarted 3/29 ?Foley: removed 3/23, voiding  ? ?Dispo: continue to encourage mobilization and await return in bowel function. Continue TPN.  ? LOS: 9 days  ? ? ? ?Juliet Rude, PA-C ?[facility] ?07/19/2021, 9:06 AM ?Please see Amion for pager number during day hours 7:00am-4:30pm ? ?

## 2021-07-19 NOTE — Progress Notes (Signed)
PT Cancellation Note ? ?Patient Details ?Name: Alejandro Shelton ?MRN: [id number] ?DOB: 1986-02-25 ? ? ?Cancelled Treatment:    Reason Eval/Treat Not Completed: Patient declined, no reason specified PTA coordinated attempt with pain meds, however pt refuses, requesting to sleep. Will check back/re-attempt as schedule allows to continue with PT POC. ? ? ?Marlana Salvage Alejandro Shelton ?07/19/2021, 11:27 AM ?
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
## 2021-07-20 ENCOUNTER — Inpatient Hospital Stay (HOSPITAL_COMMUNITY): Payer: Commercial Managed Care - HMO

## 2021-07-20 LAB — BASIC METABOLIC PANEL
Anion gap: 8 (ref 5–15)
BUN: 12 mg/dL (ref 6–20)
CO2: 26 mmol/L (ref 22–32)
Calcium: 8.9 mg/dL (ref 8.9–10.3)
Chloride: 95 mmol/L — ABNORMAL LOW (ref 98–111)
Creatinine, Ser: 0.68 mg/dL (ref 0.61–1.24)
GFR, Estimated: >60 mL/min (ref >60)
Glucose, Bld: 111 mg/dL — ABNORMAL HIGH (ref 70–99)
Potassium: 4.1 mmol/L (ref 3.5–5.1)
Sodium: 129 mmol/L — ABNORMAL LOW (ref 135–145)

## 2021-07-20 LAB — GLUCOSE, CAPILLARY
Glucose-Capillary: 103 mg/dL — ABNORMAL HIGH (ref 70–99)
Glucose-Capillary: 113 mg/dL — ABNORMAL HIGH (ref 70–99)
Glucose-Capillary: 119 mg/dL — ABNORMAL HIGH (ref 70–99)
Glucose-Capillary: 130 mg/dL — ABNORMAL HIGH (ref 70–99)

## 2021-07-20 LAB — CBC
HCT: 29.6 % — ABNORMAL LOW (ref 39.0–52.0)
Hemoglobin: 10.1 g/dL — ABNORMAL LOW (ref 13.0–17.0)
MCH: 28.9 pg (ref 26.0–34.0)
MCHC: 34.1 g/dL (ref 30.0–36.0)
MCV: 84.6 fL (ref 80.0–100.0)
Platelets: 536 10*3/uL — ABNORMAL HIGH (ref 150–400)
RBC: 3.5 MIL/uL — ABNORMAL LOW (ref 4.22–5.81)
RDW: 15.6 % — ABNORMAL HIGH (ref 11.5–15.5)
WBC: 16.7 10*3/uL — ABNORMAL HIGH (ref 4.0–10.5)
nRBC: 0 % (ref 0.0–0.2)

## 2021-07-20 LAB — MAGNESIUM: Magnesium: 2.1 mg/dL (ref 1.7–2.4)

## 2021-07-20 MED ORDER — TRAVASOL 10 % IV SOLN
INTRAVENOUS | Status: AC
  Filled 2021-07-20: qty 1272

## 2021-07-20 MED ORDER — ACETAMINOPHEN 10 MG/ML IV SOLN
1000.0000 mg | Freq: Four times a day (QID) | INTRAVENOUS | Status: AC
  Administered 2021-07-20 – 2021-07-21 (×4): 1000 mg via INTRAVENOUS
  Filled 2021-07-20 (×4): qty 100

## 2021-07-20 NOTE — Progress Notes (Addendum)
Mobility Specialist Progress Note: ? ? 07/20/21 1051  ?Mobility  ?Activity Ambulated with assistance in hallway  ?Level of Assistance Standby assist, set-up cues, supervision of patient - no hands on  ?Assistive Device  ?(IV pole)  ?Distance Ambulated (ft) 1500 ft  ?Activity Response Tolerated well  ?$Mobility charge 1 Mobility  ? ?Pt received in bed willing to participate in mobility. Complaints of 7/10 incision pain getting to EOB but decreases with ambulation. Required 2 seated breaks. Left in bed with call bell in reach and all needs met.  ? ?Briena Swingler ?Mobility Specialist ?Primary Phone [phone] ? ?

## 2021-07-20 NOTE — Progress Notes (Signed)
2330 - Pt c/o of "feeling like stomach contents goes out and comes back in", NGT flushed w/ 33ml, pt expressed no improvement, NGT contents remain green in color, NGT flushes easily, pt ABD remains distended, pt sat At EOB and then stood and 350 ml of contents drained out immediately, pt concerned NGT not in correct place. Pt educated, encouraged to ambulate more, however, given how pt feels and is rating his discomfort MD notified. Per MD possible reflux, MD ok for ABD X-ray to verify NGT location. Will continue to monitor  ? ? ? ? ? ? ? ?
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
## 2021-07-20 NOTE — Progress Notes (Signed)
? ?Progress Note ? ?10 Days Post-Op  ?Subjective: ?Pt reports passing a little more flatus. Was concerned about NGT overnight, felt like it would put out more in spurts with movement. Film overnight confirmed appropriate positioning. Patient continues to have some bloody diarrhea intermittently.  ? ?Objective: ?Vital signs in last 24 hours: ?Temp:  [98.1 ?F (36.7 ?C)-98.7 ?F (37.1 ?C)] 98.1 ?F (36.7 ?C) (03/31 0902) ?Pulse Rate:  [90-95] 90 (03/31 0902) ?Resp:  [18] 18 (03/31 0902) ?BP: (144-161)/(92-106) 144/92 (03/31 0902) ?SpO2:  [100 %] 100 % (03/31 0902) ?Weight:  [76.6 kg] 76.6 kg (03/31 0500) ?Last BM Date : 07/18/21 ? ?Intake/Output from previous day: ?03/30 0701 - 03/31 0700 ?In: 1351.8 [I.V.:911.8; NG/GT:40; IV Piggyback:400] ?Out: 2200 [Urine:600; Emesis/NG output:1600] ?Intake/Output this shift: ?No intake/output data recorded. ? ?PE: ?General: pleasant, WD, WN male who is laying in bed in NAD ?Heart: regular, rate, and rhythm.   ?Lungs: Respiratory effort nonlabored ?Abd: soft, distended, appropriately ttp, incisions C/D/I with staples present, NGT with bilious drainage, increased BS this AM  ?MS: all 4 extremities are symmetrical with no cyanosis, clubbing, or edema. ?Psych: A&Ox3 with an anxious affect.  ? ? ?Lab Results:  ?Recent Labs  ?  07/18/21 ?0050 07/19/21 ?0342  ?WBC 13.6* 13.8*  ?HGB 9.7* 9.2*  ?HCT 29.5* 28.1*  ?PLT 411* 501*  ? ? ?BMET ?Recent Labs  ?  07/18/21 ?0050 07/19/21 ?0342  ?NA 131* 131*  ?K 3.7 4.0  ?CL 96* 97*  ?CO2 24 26  ?GLUCOSE 99 117*  ?BUN 11 13  ?CREATININE 0.85 0.83  ?CALCIUM 8.7* 8.8*  ? ? ?PT/INR ?No results for input(s): LABPROT, INR in the last 72 hours. ?CMP  ?   ?Component Value Date/Time  ? NA 131 (L) 07/19/2021 0342  ? NA 138 02/09/2021 1036  ? K 4.0 07/19/2021 0342  ? CL 97 (L) 07/19/2021 0342  ? CO2 26 07/19/2021 0342  ? GLUCOSE 117 (H) 07/19/2021 0342  ? BUN 13 07/19/2021 0342  ? BUN 11 02/09/2021 1036  ? CREATININE 0.83 07/19/2021 0342  ? CALCIUM 8.8 (L)  07/19/2021 0342  ? PROT 6.5 07/19/2021 0342  ? ALBUMIN 3.2 (L) 07/19/2021 0342  ? AST 26 07/19/2021 0342  ? ALT 20 07/19/2021 0342  ? ALKPHOS 44 07/19/2021 0342  ? BILITOT 0.6 07/19/2021 0342  ? GFRNONAA >60 07/19/2021 0342  ? GFRAA >90 12/12/2012 0526  ? ?Lipase  ?   ?Component Value Date/Time  ? LIPASE 60 (H) 10/07/2020 2307  ? ? ? ? ? ?Studies/Results: ?DG Abd Portable 1V ? ?Result Date: 07/20/2021 ?CLINICAL DATA:  NG placement. EXAM: PORTABLE ABDOMEN - 1 VIEW COMPARISON:  Abdominal radiograph dated 07/18/2021. FINDINGS: Enteric tube with side-port chest distal to the GE junction and tip in the proximal stomach. Partially visualized right-sided PICC with tip at the cavoatrial junction. Dilated Dilated small bowel loops in the upper abdomen. IMPRESSION: Enteric tube with tip in the proximal stomach. Electronically Signed   By: Elgie Collard M.D.   On: 07/20/2021 01:26   ? ?Anti-infectives: ?Anti-infectives (From admission, onward)  ? ? Start     Dose/Rate Route Frequency Ordered Stop  ? 07/10/21 0600  cefoTEtan (CEFOTAN) 2 g in sodium chloride 0.9 % 100 mL IVPB       ? 2 g ?200 mL/hr over 30 Minutes Intravenous On call to O.R. 07/10/21 [id number] 07/10/21 0809  ? ?  ? ? ? ?Assessment/Plan ?POD#10, S/P open lysis of adhesions and colostomy takedown 07/10/21 Dr. Janee Morn ?-  afebrile, WBC 16.7 this AM from 13 yesterday - will discuss with MD ?- post op ileus, NGT in place (~1600 ml/24H and bilious), passing a little more flatus and intermittent bloody BM ?- IS/pulm toilet, OOB ?- pain: scheduld IV tylenol 1,000 mg q 6h, IV robaxin 1,000 mg q 6h, Oxy 5-10 mg q 4h PRN, prn dilaudid ?- mobilize/pulm toilet ?- CT AP 3/27 with SBO vs ileus, ascites, no abscess ?- PICC/TPN started 3/27, continue this  ?- clamping trials initiated this AM, will monitor for tolerance and residuals ?ABL anemia - hgb stable at 10.1 this AM. 1 unit pRBCs 3/23 and 3/24 ?Tachy/HTN - prn hydralazine, continue scheduled lopressor 7.5 mg. BP improving   ?Mild hypokalemia - K pending this AM ?Mild hypomagnesemia - Mg pending this AM ?  ?FEN: sips of clears with NGT clamped, NGT clamping trials; PICC/TPN  ?ID: perioperative cefotan 3/21  ?VTE: SCD's, lovenox restarted 3/29 ?Foley: removed 3/23, voiding  ? ?Dispo: continue to encourage mobilization and await return in bowel function. Continue TPN. Clamping trials. Will discuss increase in WBC with MD, if continuing to go up or starts running fevers would recommend repeat CT AP.  ? LOS: 10 days  ? ? ? ?Juliet Rude, PA-C ?[facility] ?07/20/2021, 9:18 AM ?Please see Amion for pager number during day hours 7:00am-4:30pm ? ?
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
## 2021-07-21 ENCOUNTER — Inpatient Hospital Stay (HOSPITAL_COMMUNITY): Payer: Commercial Managed Care - HMO

## 2021-07-21 LAB — CBC
HCT: 29.7 % — ABNORMAL LOW (ref 39.0–52.0)
Hemoglobin: 10.2 g/dL — ABNORMAL LOW (ref 13.0–17.0)
MCH: 28.9 pg (ref 26.0–34.0)
MCHC: 34.3 g/dL (ref 30.0–36.0)
MCV: 84.1 fL (ref 80.0–100.0)
Platelets: 539 10*3/uL — ABNORMAL HIGH (ref 150–400)
RBC: 3.53 MIL/uL — ABNORMAL LOW (ref 4.22–5.81)
RDW: 15.3 % (ref 11.5–15.5)
WBC: 16.9 10*3/uL — ABNORMAL HIGH (ref 4.0–10.5)
nRBC: 0 % (ref 0.0–0.2)

## 2021-07-21 LAB — GLUCOSE, CAPILLARY: Glucose-Capillary: 121 mg/dL — ABNORMAL HIGH (ref 70–99)

## 2021-07-21 MED ORDER — LIP MEDEX EX OINT
TOPICAL_OINTMENT | CUTANEOUS | Status: DC | PRN
  Filled 2021-07-21: qty 7

## 2021-07-21 MED ORDER — TRAVASOL 10 % IV SOLN
INTRAVENOUS | Status: AC
  Filled 2021-07-21: qty 1272
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
## 2021-07-21 NOTE — Progress Notes (Signed)
RN introduced himself at shift change and answered pt's questions about pain medications schedule and pt was unpleased with the answer. Pt became agitated and verbally aggressive with this Probation officer for no reason.Charge nurse was notified ?

## 2021-07-21 NOTE — Progress Notes (Signed)
Pt was upset and did not want his blood sugar checked at this time. Declined by blood sugar check, insulin, and lovenox injection at this time.  ?
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
## 2021-07-22 LAB — BASIC METABOLIC PANEL
Anion gap: 6 (ref 5–15)
BUN: 14 mg/dL (ref 6–20)
CO2: 28 mmol/L (ref 22–32)
Calcium: 8.9 mg/dL (ref 8.9–10.3)
Chloride: 97 mmol/L — ABNORMAL LOW (ref 98–111)
Creatinine, Ser: 0.84 mg/dL (ref 0.61–1.24)
GFR, Estimated: >60 mL/min (ref >60)
Glucose, Bld: 109 mg/dL — ABNORMAL HIGH (ref 70–99)
Potassium: 4.3 mmol/L (ref 3.5–5.1)
Sodium: 131 mmol/L — ABNORMAL LOW (ref 135–145)

## 2021-07-22 MED ORDER — TRAVASOL 10 % IV SOLN
INTRAVENOUS | Status: AC
  Filled 2021-07-22: qty 1272

## 2021-07-22 MED ORDER — CALCIUM CARBONATE ANTACID 500 MG PO CHEW
1.0000 | CHEWABLE_TABLET | ORAL | Status: DC | PRN
  Administered 2021-07-22 – 2021-07-23 (×3): 200 mg via ORAL
  Filled 2021-07-22 (×3): qty 1
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
## 2021-07-22 NOTE — Progress Notes (Signed)
12 Days Post-Op  ? ?Subjective/Chief Complaint: ?Patient doing better today.  We will clamp NG tube. ?Had bowel movement and flatus. ? ? ?Objective: ?Vital signs in last 24 hours: ?Temp:  [97.4 ?F (36.3 ?C)-98 ?F (36.7 ?C)] 97.8 ?F (36.6 ?C) (04/02 0825) ?Pulse Rate:  [87-93] 93 (04/02 0825) ?Resp:  [18-20] 18 (04/02 0825) ?BP: (128-147)/(96-99) 128/96 (04/02 0825) ?SpO2:  [100 %] 100 % (04/02 0825) ?Last BM Date : 07/18/21 ? ?Intake/Output from previous day: ?04/01 0701 - 04/02 0700 ?In: 860.7 [I.V.:860.7] ?Out: 1025 [Urine:400; Emesis/NG output:625] ?Intake/Output this shift: ?No intake/output data recorded. ? ?General appearance: alert and cooperative ?GI: soft, non-tender; bowel sounds normal; no masses,  no organomegaly and incision clean dry and intact ? ?Lab Results:  ?Recent Labs  ?  07/20/21 ?0904 07/21/21 ?0332  ?WBC 16.7* 16.9*  ?HGB 10.1* 10.2*  ?HCT 29.6* 29.7*  ?PLT 536* 539*  ? ?BMET ?Recent Labs  ?  07/20/21 ?0904 07/22/21 ?0343  ?NA 129* 131*  ?K 4.1 4.3  ?CL 95* 97*  ?CO2 26 28  ?GLUCOSE 111* 109*  ?BUN 12 14  ?CREATININE 0.68 0.84  ?CALCIUM 8.9 8.9  ? ?PT/INR ?No results for input(s): LABPROT, INR in the last 72 hours. ?ABG ?No results for input(s): PHART, HCO3 in the last 72 hours. ? ?Invalid input(s): PCO2, PO2 ? ?Studies/Results: ?DG Abd 1 View ? ?Result Date: 07/21/2021 ?CLINICAL DATA:  36 year old male NG tube placement. Recent colostomy reversal. EXAM: ABDOMEN - 1 VIEW COMPARISON:  07/20/2021 and earlier. FINDINGS: Portable AP semi upright view at 0554 hours. Stable enteric tube positioning, tube tip inside the stomach and side hole just above the diaphragm and GEJ. Multiple abdominal skin staples. Gas-filled bowel loops in the abdomen do not appear significantly changed. Stable visible lung bases. Partially visible right PICC line. Stable visualized osseous structures. Chronic right 11th rib fracture. IMPRESSION: 1. Stable enteric tube, side hole is just above the GEJ in the distal esophagus.  Advance the tube 6 cm if side hole placement within the stomach is desired. 2. Stable visible bowel gas pattern. Electronically Signed   By: Odessa Fleming M.D.   On: 07/21/2021 07:01   ? ?Anti-infectives: ?Anti-infectives (From admission, onward)  ? ? Start     Dose/Rate Route Frequency Ordered Stop  ? 07/10/21 0600  cefoTEtan (CEFOTAN) 2 g in sodium chloride 0.9 % 100 mL IVPB       ? 2 g ?200 mL/hr over 30 Minutes Intravenous On call to O.R. 07/10/21 3893 07/10/21 0809  ? ?  ? ? ?Assessment/Plan: ?POD#12, S/P open lysis of adhesions and colostomy takedown 07/10/21 Dr. Janee Morn ?- leukocytosis elev and stable ?- post op ileus, NGT in place , passing a little more flatus and intermittent bloody BM ?- IS/pulm toilet, OOB ?- pain: scheduld IV tylenol 1,000 mg q 6h, IV robaxin 1,000 mg q 6h, Oxy 5-10 mg q 4h PRN, prn dilaudid ?- mobilize/pulm toilet ?- CT AP 3/27 with SBO vs ileus, ascites, no abscess ?- PICC/TPN started 3/27, continue this  ?- clamping trials initiated this AM, will monitor for tolerance and residuals ?ABL anemia - hgb stable at 10.1 this AM. 1 unit pRBCs 3/23 and 3/24 ?Tachy/HTN - prn hydralazine, continue scheduled lopressor 7.5 mg. BP improving  ?  ?  ?FEN: sips of clears with NGT clamped, NGT clamping trials; PICC/TPN  ?ID: perioperative cefotan 3/21  ?VTE: SCD's, lovenox restarted 3/29 ?Foley: removed 3/23, voiding  ?  ?Dispo: continue to encourage mobilization . Continue  TPN. start on clears.  Con't to monitor WBC  ? LOS: 12 days  ? ? ?Axel Filler ?07/22/2021 ? ?

## 2021-07-22 NOTE — Progress Notes (Signed)
Mobility Specialist Progress Note: ? ? 07/22/21 1329  ?Mobility  ?Activity Ambulated with assistance in hallway  ?Level of Assistance Independent after set-up  ?Assistive Device None  ?Distance Ambulated (ft) 570 ft  ?Activity Response Tolerated well  ?$Mobility charge 1 Mobility  ? ?Pt received in bed willing to participate in mobility. No complaints of pain. Left in room with call bell in reach and all needs met.  ? ?Alejandro Shelton ?Mobility Specialist ?Primary Phone [phone] ? ?

## 2021-07-23 LAB — COMPREHENSIVE METABOLIC PANEL
ALT: 39 U/L (ref 0–44)
AST: 32 U/L (ref 15–41)
Albumin: 3.3 g/dL — ABNORMAL LOW (ref 3.5–5.0)
Alkaline Phosphatase: 68 U/L (ref 38–126)
Anion gap: 6 (ref 5–15)
BUN: 15 mg/dL (ref 6–20)
CO2: 25 mmol/L (ref 22–32)
Calcium: 9 mg/dL (ref 8.9–10.3)
Chloride: 99 mmol/L (ref 98–111)
Creatinine, Ser: 0.8 mg/dL (ref 0.61–1.24)
GFR, Estimated: >60 mL/min (ref >60)
Glucose, Bld: 107 mg/dL — ABNORMAL HIGH (ref 70–99)
Potassium: 4.2 mmol/L (ref 3.5–5.1)
Sodium: 130 mmol/L — ABNORMAL LOW (ref 135–145)
Total Bilirubin: 0.3 mg/dL (ref 0.3–1.2)
Total Protein: 6.9 g/dL (ref 6.5–8.1)

## 2021-07-23 LAB — MAGNESIUM: Magnesium: 1.9 mg/dL (ref 1.7–2.4)

## 2021-07-23 LAB — TRIGLYCERIDES: Triglycerides: 54 mg/dL (ref <150)

## 2021-07-23 LAB — PHOSPHORUS: Phosphorus: 4.4 mg/dL (ref 2.5–4.6)

## 2021-07-23 MED ORDER — ACETAMINOPHEN 500 MG PO TABS
1000.0000 mg | ORAL_TABLET | Freq: Three times a day (TID) | ORAL | Status: DC
Start: 2021-07-23 — End: 2021-07-24
  Administered 2021-07-23 – 2021-07-24 (×4): 1000 mg via ORAL
  Filled 2021-07-23 (×5): qty 2

## 2021-07-23 MED ORDER — METOPROLOL TARTRATE 25 MG PO TABS
25.0000 mg | ORAL_TABLET | Freq: Two times a day (BID) | ORAL | Status: DC
  Administered 2021-07-23 – 2021-07-24 (×3): 25 mg via ORAL
  Filled 2021-07-23 (×3): qty 1

## 2021-07-23 MED ORDER — DOCUSATE SODIUM 100 MG PO CAPS
100.0000 mg | ORAL_CAPSULE | Freq: Two times a day (BID) | ORAL | Status: DC
  Administered 2021-07-23 – 2021-07-24 (×3): 100 mg via ORAL
  Filled 2021-07-23 (×3): qty 1

## 2021-07-23 MED ORDER — HYDROMORPHONE HCL 1 MG/ML IJ SOLN
0.5000 mg | INTRAMUSCULAR | Status: DC | PRN
  Administered 2021-07-23 – 2021-07-24 (×7): 1 mg via INTRAVENOUS
  Filled 2021-07-23 (×8): qty 1

## 2021-07-23 MED ORDER — METHOCARBAMOL 500 MG PO TABS
1000.0000 mg | ORAL_TABLET | Freq: Four times a day (QID) | ORAL | Status: DC
  Administered 2021-07-23 – 2021-07-24 (×5): 1000 mg via ORAL
  Filled 2021-07-23 (×5): qty 2

## 2021-07-23 MED ORDER — POLYETHYLENE GLYCOL 3350 17 G PO PACK: 17.0000 g | PACK | Freq: Every day | ORAL | Status: DC | PRN

## 2021-07-23 MED ORDER — SODIUM CHLORIDE 1 G PO TABS
1.0000 g | ORAL_TABLET | Freq: Three times a day (TID) | ORAL | Status: AC
Start: 2021-07-23 — End: 2021-07-24
  Administered 2021-07-23 – 2021-07-24 (×3): 1 g via ORAL
  Filled 2021-07-23 (×3): qty 1

## 2021-07-23 MED ORDER — MAGNESIUM SULFATE 2 GM/50ML IV SOLN
2.0000 g | Freq: Once | INTRAVENOUS | Status: AC
  Administered 2021-07-23: 2 g via INTRAVENOUS
  Filled 2021-07-23: qty 50

## 2021-07-23 NOTE — Plan of Care (Signed)
?  Problem: Education: ?Goal: Required Educational Video(s) ?Outcome: Progressing ?  ?Problem: Clinical Measurements: ?Goal: Ability to maintain clinical measurements within normal limits will improve ?Outcome: Progressing ?Goal: Postoperative complications will be avoided or minimized ?Outcome: Progressing ?  ?Problem: Skin Integrity: ?Goal: Demonstration of wound healing without infection will improve ?Outcome: Progressing ?  ?Problem: Education: ?Goal: Knowledge of General Education information will improve ?Description: Including pain rating scale, medication(s)/side effects and non-pharmacologic comfort measures ?Outcome: Progressing ?  ?Problem: Health Behavior/Discharge Planning: ?Goal: Ability to manage health-related needs will improve ?Outcome: Progressing ?  ?Problem: Activity: ?Goal: Risk for activity intolerance will decrease ?Outcome: Progressing ?  ?Problem: Nutrition: ?Goal: Adequate nutrition will be maintained ?Outcome: Progressing ?  ?Problem: Coping: ?Goal: Level of anxiety will decrease ?Outcome: Progressing ?  ?Problem: Elimination: ?Goal: Will not experience complications related to bowel motility ?Outcome: Progressing ?Goal: Will not experience complications related to urinary retention ?Outcome: Progressing ?  ?Problem: Pain Managment: ?Goal: General experience of comfort will improve ?Outcome: Progressing ?  ?Problem: Skin Integrity: ?Goal: Risk for impaired skin integrity will decrease ?Outcome: Progressing ?  ?Problem: Safety: ?Goal: Ability to remain free from injury will improve ?Outcome: Progressing ?  ?
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
## 2021-07-23 NOTE — Progress Notes (Signed)
13 Days Post-Op  ? ?Subjective/Chief Complaint: ?Patient doing well with FLD. Reports passing lots of flatus, one bloody BM over the weekend. Denies nausea or pain with PO intake. Struggling with pain control, discussed working on transition to PO pain meds today and patient in agreement with this. Afebrile.  ? ? ?Objective: ?Vital signs in last 24 hours: ?Temp:  [97.8 ?F (36.6 ?C)-98.8 ?F (37.1 ?C)] 97.8 ?F (36.6 ?C) (04/03 0805) ?Pulse Rate:  [93-106] 93 (04/03 0805) ?Resp:  [18-19] 18 (04/03 0805) ?BP: (142-145)/(79-102) 145/102 (04/03 0805) ?SpO2:  [100 %] 100 % (04/03 0805) ?Last BM Date : 07/22/21 ? ?Intake/Output from previous day: ?04/02 0701 - 04/03 0700 ?In: 2115.9 [P.O.:100; I.V.:1217.8; IV Piggyback:798.1] ?Out: 825 [Emesis/NG output:825] ?Intake/Output this shift: ?No intake/output data recorded. ? ?  ?PE: ?General: pleasant, WD, WN male who is laying in bed in NAD ?Heart: regular, rate, and rhythm.   ?Lungs: Respiratory effort nonlabored ?Abd: soft, non-distended, appropriately ttp, incisions C/D/I with staples present ?MS: all 4 extremities are symmetrical with no cyanosis, clubbing, or edema. ?Psych: A&Ox3 with an appropriate affect.  ? ?Lab Results:  ?Recent Labs  ?  07/20/21 ?0904 07/21/21 ?0332  ?WBC 16.7* 16.9*  ?HGB 10.1* 10.2*  ?HCT 29.6* 29.7*  ?PLT 536* 539*  ? ? ?BMET ?Recent Labs  ?  07/22/21 ?0343 07/23/21 ?0307  ?NA 131* 130*  ?K 4.3 4.2  ?CL 97* 99  ?CO2 28 25  ?GLUCOSE 109* 107*  ?BUN 14 15  ?CREATININE 0.84 0.80  ?CALCIUM 8.9 9.0  ? ? ?PT/INR ?No results for input(s): LABPROT, INR in the last 72 hours. ?ABG ?No results for input(s): PHART, HCO3 in the last 72 hours. ? ?Invalid input(s): PCO2, PO2 ? ?Studies/Results: ?No results found. ? ?Anti-infectives: ?Anti-infectives (From admission, onward)  ? ? Start     Dose/Rate Route Frequency Ordered Stop  ? 07/10/21 0600  cefoTEtan (CEFOTAN) 2 g in sodium chloride 0.9 % 100 mL IVPB       ? 2 g ?200 mL/hr over 30 Minutes Intravenous On call  to O.R. 07/10/21 2500 07/10/21 0809  ? ?  ? ? ?Assessment/Plan: ?POD#13, S/P open lysis of adhesions and colostomy takedown 07/10/21 Dr. Janee Morn ?- leukocytosis elev and stable, afeb  ?- ileus improving - advance to soft diet today and DC TPN ?- IS/pulm toilet, OOB ?- pain: scheduled PO tylenol and robaxin, PRN oxycodone, PRN IV dilaudid for breakthrough pain ?- mobilize/pulm toilet ?- will plan to remove staples tomorrow  ?- may be ready for discharge tomorrow if continuing to progress ?ABL anemia - hgb stable at 10.2 4/1 ?Tachy/HTN - prn hydralazine, transition to PO lopressor, will need outpatient follow up  ?  ?FEN: soft diet, wean TPN to DC ?ID: perioperative cefotan 3/21  ?VTE: SCD's, lovenox  ?Foley: removed 3/23, voiding  ?  ?Dispo: Advance diet. Stop TPN. Continue to work on pain control. May be ready for discharge in the next 24-48 hrs pending pain control and diet tolerance.  ? LOS: 13 days  ? ? ?Juliet Rude ?07/23/2021 ? ?

## 2021-07-23 NOTE — Progress Notes (Signed)
Physical Therapy Discharge ?Patient Details ?Name: Alejandro Shelton ?MRN: [id number] ?DOB: 1985/10/12 ?Today's Date: 07/23/2021 ?Time: 7366-8159 ?PT Time Calculation (min) (ACUTE ONLY): 15 min ? ?Patient discharged from PT services secondary to goals met and no further PT needs identified. ? ?Please see latest therapy progress note for current level of functioning and progress toward goals.   ? ?Progress and discharge plan discussed with patient and/or caregiver: Patient/Caregiver agrees with plan ? ?GP ?   ? ?[name] ?07/23/2021, 4:00 PM  ?Arrie Aran M,PT ?Acute Rehab Services ?[phone] ?[phone] (pager)  ?

## 2021-07-23 NOTE — Discharge Instructions (Signed)
[facility] °[phone] ° °OPEN ABDOMINAL SURGERY: POST OP INSTRUCTIONS ° °Always review your discharge instruction sheet given to you by the facility where your surgery was performed. ° °IF YOU HAVE DISABILITY OR FAMILY LEAVE FORMS, YOU MUST BRING THEM TO THE OFFICE FOR PROCESSING.  PLEASE DO NOT GIVE THEM TO YOUR DOCTOR. ° °A prescription for pain medication may be given to you upon discharge.  Take your pain medication as prescribed, if needed.  If narcotic pain medicine is not needed, then you may take acetaminophen (Tylenol) or ibuprofen (Advil) as needed. °Take your usually prescribed medications unless otherwise directed. °If you need a refill on your pain medication, please contact your pharmacy. They will contact our office to request authorization.  Prescriptions will not be filled after 5pm or on week-ends. °You should follow a light diet the first few days after arrival home, such as soup and crackers, pudding, etc.unless your doctor has advised otherwise. A high-fiber, low fat diet can be resumed as tolerated.   Be sure to include lots of fluids daily. Most patients will experience some swelling and bruising on the chest and neck area.  Ice packs will help.  Swelling and bruising can take several days to resolve °Most patients will experience some swelling and bruising in the area of the incision. Ice pack will help. Swelling and bruising can take several days to resolve..  °It is common to experience some constipation if taking pain medication after surgery.  Increasing fluid intake and taking a stool softener will usually help or prevent this problem from occurring.  A mild laxative (Milk of Magnesia or Miralax) should be taken according to package directions if there are no bowel movements after 48 hours. ° You may have steri-strips (small skin tapes) in place directly over the incision.  These strips should be left on the skin for 7-10 days.  If your surgeon used skin  glue on the incision, you may shower in 24 hours.  The glue will flake off over the next 2-3 weeks.  Any sutures or staples will be removed at the office during your follow-up visit. You may find that a light gauze bandage over your incision may keep your staples from being rubbed or pulled. You may shower and replace the bandage daily. °ACTIVITIES:  You may resume regular (light) daily activities beginning the next day--such as daily self-care, walking, climbing stairs--gradually increasing activities as tolerated.  You may have sexual intercourse when it is comfortable.  Refrain from any heavy lifting or straining until approved by your doctor. °You may drive when you no longer are taking prescription pain medication, you can comfortably wear a seatbelt, and you can safely maneuver your car and apply brakes ° °You should see your doctor in the office for a follow-up appointment approximately two weeks after your surgery.  Make sure that you call for this appointment within a day or two after you arrive home to insure a convenient appointment time. ° °WHEN TO CALL YOUR DOCTOR: °Fever over 101.0 °Inability to urinate °Nausea and/or vomiting °Extreme swelling or bruising °Continued bleeding from incision. °Increased pain, redness, or drainage from the incision. °Difficulty swallowing or breathing °Muscle cramping or spasms. °Numbness or tingling in hands or feet or around lips. ° °The clinic staff is available to answer your questions during regular business hours.  Please don’t hesitate to call and ask to speak to one of the nurses if you have concerns. ° °For   further questions, please visit [email or web address]  °

## 2021-07-24 LAB — GLUCOSE, CAPILLARY: Glucose-Capillary: 98 mg/dL (ref 70–99)

## 2021-07-24 MED ORDER — TRAMADOL HCL 50 MG PO TABS
50.0000 mg | ORAL_TABLET | Freq: Four times a day (QID) | ORAL | Status: DC
  Administered 2021-07-24 (×2): 50 mg via ORAL
  Filled 2021-07-24 (×2): qty 1

## 2021-07-24 MED ORDER — ENSURE SURGERY PO LIQD
237.0000 mL | Freq: Two times a day (BID) | ORAL | Status: DC
  Administered 2021-07-24: 237 mL via ORAL
  Filled 2021-07-24 (×2): qty 237

## 2021-07-24 MED ORDER — SIMETHICONE 40 MG/0.6ML PO SUSP
40.0000 mg | Freq: Four times a day (QID) | ORAL | Status: DC | PRN
  Administered 2021-07-24: 40 mg via ORAL
  Filled 2021-07-24 (×2): qty 0.6

## 2021-07-24 NOTE — Progress Notes (Signed)
Abdominal Staples removed per order. Stri-strips applied ? ?

## 2021-07-24 NOTE — Progress Notes (Signed)
Patient started to complain of pain. RN gave patient scheduled Tramadol @1153 , oxycodone given @1337 . Patient then started complaining that he was ready to leave. RN explained to patient that she would message the doctor to see if they could go ahead and put in discharge order. RN messaged to ask if discharge order could be put in for patient.  Patient began to get upset,started throwing things in room, signed AMA paper and left. ?

## 2021-07-24 NOTE — Progress Notes (Signed)
14 Days Post-Op  ? ?Subjective/Chief Complaint: ?Patient reports he was not able to rest well overnight and had a hard time with soft food yesterday. Denies nausea, vomiting. Still passing some flatus and had a small BM yesterday. Patient wants to go home but still taking some IV pain meds.  ? ? ?Objective: ?Vital signs in last 24 hours: ?Temp:  [97.8 ?F (36.6 ?C)-98.5 ?F (36.9 ?C)] 98.4 ?F (36.9 ?C) (04/04 8119) ?Pulse Rate:  [90-96] 96 (04/04 0610) ?Resp:  [18-19] 18 (04/04 0610) ?BP: (136-148)/(87-102) 136/87 (04/04 0610) ?SpO2:  [100 %] 100 % (04/04 0610) ?Last BM Date : 07/23/21 ? ?Intake/Output from previous day: ?04/03 0701 - 04/04 0700 ?In: 1844.9 [P.O.:980; I.V.:864.9] ?Out: -  ?Intake/Output this shift: ?No intake/output data recorded. ? ?  ?PE: ?General: pleasant, WD, WN male who is laying in bed in NAD ?Heart: regular, rate, and rhythm.   ?Lungs: Respiratory effort nonlabored ?Abd: soft, non-distended, appropriately ttp, incisions C/D/I with staples present ?MS: all 4 extremities are symmetrical with no cyanosis, clubbing, or edema. ?Psych: A&Ox3 with an appropriate affect.  ? ?Lab Results:  ?No results for input(s): WBC, HGB, HCT, PLT in the last 72 hours. ? ?BMET ?Recent Labs  ?  07/22/21 ?0343 07/23/21 ?0307  ?NA 131* 130*  ?K 4.3 4.2  ?CL 97* 99  ?CO2 28 25  ?GLUCOSE 109* 107*  ?BUN 14 15  ?CREATININE 0.84 0.80  ?CALCIUM 8.9 9.0  ? ? ?PT/INR ?No results for input(s): LABPROT, INR in the last 72 hours. ?ABG ?No results for input(s): PHART, HCO3 in the last 72 hours. ? ?Invalid input(s): PCO2, PO2 ? ?Studies/Results: ?No results found. ? ?Anti-infectives: ?Anti-infectives (From admission, onward)  ? ? Start     Dose/Rate Route Frequency Ordered Stop  ? 07/10/21 0600  cefoTEtan (CEFOTAN) 2 g in sodium chloride 0.9 % 100 mL IVPB       ? 2 g ?200 mL/hr over 30 Minutes Intravenous On call to O.R. 07/10/21 1478 07/10/21 0809  ? ?  ? ? ?Assessment/Plan: ?POD#14, S/P open lysis of adhesions and colostomy  takedown 07/10/21 Dr. Janee Morn ?- afeb  ?- some distention with soft diet but having bowel function, may need to do more fulls with small amounts soft food ?- IS/pulm toilet, OOB ?- pain: scheduled PO tylenol and robaxin, added scheduled tramadol this AM, PRN oxycodone, PRN IV dilaudid for breakthrough pain ?- mobilize/pulm toilet ?- remove staples and PICC today  ?- probable discharge this afternoon if pain control better ?ABL anemia - hgb stable at 10.2 4/1 ?Tachy/HTN - PO lopressor, will need outpatient follow up  ?  ?FEN: soft diet as tolerated, ensure ?ID: perioperative cefotan 3/21  ?VTE: SCD's, lovenox  ?Foley: removed 3/23, voiding  ?  ?Dispo: Remove PICC and staples. Work on pain control. Will recheck for possible discharge this afternoon.  ? LOS: 14 days  ? ? ?Juliet Rude ?07/24/2021 ? ?

## 2021-07-24 NOTE — Progress Notes (Signed)
Mobility Specialist Progress Note: ? ? 07/24/21 1351  ?Mobility  ?Activity Refused mobility  ? ?Pt in 10/10 lower abdomen pain and low back pain. Will follow-up as time allows.  ? ?Quanta Roher ?Mobility Specialist ?Primary Phone [phone] ? ?

## 2021-07-24 NOTE — Discharge Summary (Signed)
[facility] ?Discharge Summary  ? ?Patient ID: ?Alejandro Shelton ?MRN: [id number] ?DOB/AGE: 38-Feb-1987 38 y.o. ? ?Admit date: 07/10/2021 ?Discharge date: 07/24/2021 ? ?Admitting Diagnosis: ?Colostomy in place ? ?Discharge Diagnosis ?S/P colostomy takedown ?Post-operative ileus, resolved ?HTN ? ?Consultants ?None  ? ?Imaging: ?No results found. ? ?Procedures ?Dr. Violeta Gelinas (07/10/21) - Laparotomy, lysis of adhesions x 30 min, colostomy takedown  ? ?Hospital Course:  ?Patient is a 36 year old male who presented to [facility] OR after being seeing in consultation in [facility] for colostomy takedown.  Patient was admitted and underwent procedure listed above.  Tolerated procedure well and was transferred to the floor.  Patient did develop post-operative ileus but this improved gradually with bowel rest, NG decompression and TPN. Diet was advanced as tolerated. TPn stopped 4/3. PICC removed 4/4. On POD14, the patient was voiding well, tolerating diet, ambulating well, pain well controlled, vital signs stable, incisions c/d/I. Staples were removed prior to discharge. Patient will follow up in our office in 4 weeks and knows to call with questions or concerns.  ? ?Patient noted to be hypertensive during admission and was started on medication to treat this, outpatient follow up scheduled with primary care as outlined below. Patient informed and aware.  ? ?Planned to re-evaluate patient 4/4 in the afternoon for likely discharge. Patient left AMA prior to me being able to re-evaluate.  ? ? ? ? Follow-up Information   ? ? Violeta Gelinas, MD. Nyra Capes on 08/27/2021.   ?Specialty: General Surgery ?Why: 10:50 AM. Please arrive 15 min prior to appointment time for check in. ?Contact information: ?[address] ?[address] ?[place] [address] ?[phone] ? ? ?  ?  ? ? Steffanie Rainwater, MD. Go on 07/31/2021.   ?Specialty: Internal Medicine ?Why: [facility] ?07/31/2021 @10 :45am ?Contact information: ?[address] ?[place] ?[phone] ? ? ?  ?  ? ?  ?  ? ?  ? ? ?Signed: ?[phone] , PA-C ?[facility] ?07/24/2021, 3:01 PM ?Please see Amion for pager number during day hours 7:00am-4:30pm ? ? ? ?

## 2021-07-25 ENCOUNTER — Telehealth: Payer: Self-pay | Admitting: Student

## 2021-07-25 NOTE — Telephone Encounter (Signed)
TOC HFU APPT ? ?Name: Willoughby, Doell MRN: [id number]  ?Date: 07/31/2021 Status: Sch  ?Time: 10:45 AM Length: 30  ?Visit Type: OPEN ESTABLISHED [726] Copay: $0.00  ?Provider: Dellia Cloud, MD    ? ?

## 2021-07-30 ENCOUNTER — Other Ambulatory Visit: Payer: Self-pay

## 2021-07-30 ENCOUNTER — Emergency Department (HOSPITAL_COMMUNITY): Payer: Commercial Managed Care - HMO

## 2021-07-30 ENCOUNTER — Encounter (HOSPITAL_COMMUNITY): Payer: Self-pay | Admitting: Emergency Medicine

## 2021-07-30 ENCOUNTER — Inpatient Hospital Stay (HOSPITAL_COMMUNITY)
Admission: EM | Admit: 2021-07-30 | Discharge: 2021-08-26 | DRG: 388 | Disposition: A | Discharge status: Home / Self Care | Payer: Commercial Managed Care - HMO | Attending: General Surgery | Admitting: General Surgery

## 2021-07-30 DIAGNOSIS — K56609 Unspecified intestinal obstruction, unspecified as to partial versus complete obstruction: Secondary | ICD-10-CM | POA: Diagnosis present

## 2021-07-30 DIAGNOSIS — E43 Unspecified severe protein-calorie malnutrition: Secondary | ICD-10-CM | POA: Diagnosis present

## 2021-07-30 DIAGNOSIS — E871 Hypo-osmolality and hyponatremia: Secondary | ICD-10-CM | POA: Diagnosis present

## 2021-07-30 DIAGNOSIS — Z886 Allergy status to analgesic agent status: Secondary | ICD-10-CM | POA: Diagnosis not present

## 2021-07-30 DIAGNOSIS — K913 Postprocedural intestinal obstruction, unspecified as to partial versus complete: Principal | ICD-10-CM | POA: Diagnosis present

## 2021-07-30 DIAGNOSIS — Z6824 Body mass index (BMI) 24.0-24.9, adult: Secondary | ICD-10-CM | POA: Diagnosis not present

## 2021-07-30 DIAGNOSIS — Z9889 Other specified postprocedural states: Secondary | ICD-10-CM

## 2021-07-30 DIAGNOSIS — Z87891 Personal history of nicotine dependence: Secondary | ICD-10-CM

## 2021-07-30 DIAGNOSIS — Z91014 Allergy to mammalian meats: Secondary | ICD-10-CM

## 2021-07-30 DIAGNOSIS — R000 Tachycardia, unspecified: Secondary | ICD-10-CM | POA: Diagnosis present

## 2021-07-30 DIAGNOSIS — I10 Essential (primary) hypertension: Secondary | ICD-10-CM | POA: Diagnosis present

## 2021-07-30 DIAGNOSIS — Y838 Other surgical procedures as the cause of abnormal reaction of the patient, or of later complication, without mention of misadventure at the time of the procedure: Secondary | ICD-10-CM | POA: Diagnosis present

## 2021-07-30 DIAGNOSIS — E876 Hypokalemia: Secondary | ICD-10-CM | POA: Diagnosis not present

## 2021-07-30 DIAGNOSIS — Z79899 Other long term (current) drug therapy: Secondary | ICD-10-CM | POA: Diagnosis not present

## 2021-07-30 LAB — CBC WITH DIFFERENTIAL/PLATELET
Abs Immature Granulocytes: 0.05 10*3/uL (ref 0.00–0.07)
Basophils Absolute: 0 10*3/uL (ref 0.0–0.1)
Basophils Relative: 0 %
Eosinophils Absolute: 0 10*3/uL (ref 0.0–0.5)
Eosinophils Relative: 0 %
HCT: 41.6 % (ref 39.0–52.0)
Hemoglobin: 13.8 g/dL (ref 13.0–17.0)
Immature Granulocytes: 0 %
Lymphocytes Relative: 7 %
Lymphs Abs: 0.9 10*3/uL (ref 0.7–4.0)
MCH: 27.9 pg (ref 26.0–34.0)
MCHC: 33.2 g/dL (ref 30.0–36.0)
MCV: 84 fL (ref 80.0–100.0)
Monocytes Absolute: 1.4 10*3/uL — ABNORMAL HIGH (ref 0.1–1.0)
Monocytes Relative: 11 %
Neutro Abs: 10.4 10*3/uL — ABNORMAL HIGH (ref 1.7–7.7)
Neutrophils Relative %: 82 %
Platelets: 298 10*3/uL (ref 150–400)
RBC: 4.95 MIL/uL (ref 4.22–5.81)
RDW: 14.3 % (ref 11.5–15.5)
WBC: 12.7 10*3/uL — ABNORMAL HIGH (ref 4.0–10.5)
nRBC: 0 % (ref 0.0–0.2)

## 2021-07-30 LAB — I-STAT CHEM 8, ED
BUN: 27 mg/dL — ABNORMAL HIGH (ref 6–20)
Calcium, Ion: 1.08 mmol/L — ABNORMAL LOW (ref 1.15–1.40)
Chloride: 87 mmol/L — ABNORMAL LOW (ref 98–111)
Creatinine, Ser: 1.1 mg/dL (ref 0.61–1.24)
Glucose, Bld: 113 mg/dL — ABNORMAL HIGH (ref 70–99)
HCT: 41 % (ref 39.0–52.0)
Hemoglobin: 13.9 g/dL (ref 13.0–17.0)
Potassium: 4.5 mmol/L (ref 3.5–5.1)
Sodium: 127 mmol/L — ABNORMAL LOW (ref 135–145)
TCO2: 26 mmol/L (ref 22–32)

## 2021-07-30 LAB — COMPREHENSIVE METABOLIC PANEL
ALT: 47 U/L — ABNORMAL HIGH (ref 0–44)
AST: 31 U/L (ref 15–41)
Albumin: 4 g/dL (ref 3.5–5.0)
Alkaline Phosphatase: 77 U/L (ref 38–126)
Anion gap: 15 (ref 5–15)
BUN: 24 mg/dL — ABNORMAL HIGH (ref 6–20)
CO2: 27 mmol/L (ref 22–32)
Calcium: 9.9 mg/dL (ref 8.9–10.3)
Chloride: 88 mmol/L — ABNORMAL LOW (ref 98–111)
Creatinine, Ser: 1.18 mg/dL (ref 0.61–1.24)
GFR, Estimated: >60 mL/min (ref >60)
Glucose, Bld: 132 mg/dL — ABNORMAL HIGH (ref 70–99)
Potassium: 4.2 mmol/L (ref 3.5–5.1)
Sodium: 130 mmol/L — ABNORMAL LOW (ref 135–145)
Total Bilirubin: 0.7 mg/dL (ref 0.3–1.2)
Total Protein: 8.6 g/dL — ABNORMAL HIGH (ref 6.5–8.1)

## 2021-07-30 LAB — LIPASE, BLOOD: Lipase: 48 U/L (ref 11–51)

## 2021-07-30 LAB — MAGNESIUM: Magnesium: 2 mg/dL (ref 1.7–2.4)

## 2021-07-30 MED ORDER — METOPROLOL TARTRATE 5 MG/5ML IV SOLN: 5.0000 mg | Freq: Four times a day (QID) | INTRAVENOUS | Status: DC | PRN

## 2021-07-30 MED ORDER — ONDANSETRON 4 MG PO TBDP
4.0000 mg | ORAL_TABLET | Freq: Four times a day (QID) | ORAL | Status: DC | PRN
  Filled 2021-07-30: qty 1

## 2021-07-30 MED ORDER — HYDROMORPHONE HCL 1 MG/ML IJ SOLN: 0.5000 mg | Freq: Once | INTRAMUSCULAR | Status: DC

## 2021-07-30 MED ORDER — SODIUM CHLORIDE 0.9 % IV SOLN: INTRAVENOUS | Status: AC

## 2021-07-30 MED ORDER — ONDANSETRON 4 MG PO TBDP
4.0000 mg | ORAL_TABLET | Freq: Once | ORAL | Status: AC
Start: 2021-07-30 — End: 2021-07-30
  Administered 2021-07-30: 4 mg via ORAL
  Filled 2021-07-30: qty 1

## 2021-07-30 MED ORDER — ACETAMINOPHEN 10 MG/ML IV SOLN
1000.0000 mg | Freq: Four times a day (QID) | INTRAVENOUS | Status: AC
  Administered 2021-07-30 – 2021-07-31 (×4): 1000 mg via INTRAVENOUS
  Filled 2021-07-30 (×5): qty 100

## 2021-07-30 MED ORDER — MORPHINE SULFATE (PF) 2 MG/ML IV SOLN
2.0000 mg | INTRAVENOUS | Status: DC | PRN
  Administered 2021-07-30: 4 mg via INTRAVENOUS
  Filled 2021-07-30: qty 2

## 2021-07-30 MED ORDER — SODIUM CHLORIDE 0.9 % IV BOLUS
1000.0000 mL | Freq: Once | INTRAVENOUS | Status: AC
  Administered 2021-07-30: 1000 mL via INTRAVENOUS

## 2021-07-30 MED ORDER — METHOCARBAMOL 1000 MG/10ML IJ SOLN
1000.0000 mg | Freq: Three times a day (TID) | INTRAVENOUS | Status: DC | PRN
  Administered 2021-07-31 – 2021-08-01 (×3): 1000 mg via INTRAVENOUS
  Filled 2021-07-30 (×4): qty 10

## 2021-07-30 MED ORDER — LACTATED RINGERS IV SOLN: INTRAVENOUS | Status: DC

## 2021-07-30 MED ORDER — MORPHINE SULFATE (PF) 2 MG/ML IV SOLN
2.0000 mg | INTRAVENOUS | Status: DC | PRN
  Administered 2021-07-30 – 2021-08-01 (×12): 4 mg via INTRAVENOUS
  Filled 2021-07-30 (×13): qty 2

## 2021-07-30 MED ORDER — ONDANSETRON HCL 4 MG/2ML IJ SOLN
4.0000 mg | Freq: Four times a day (QID) | INTRAMUSCULAR | Status: DC | PRN
  Administered 2021-07-30 – 2021-08-19 (×8): 4 mg via INTRAVENOUS
  Filled 2021-07-30 (×9): qty 2

## 2021-07-30 MED ORDER — ENOXAPARIN SODIUM 30 MG/0.3ML IJ SOSY
30.0000 mg | PREFILLED_SYRINGE | Freq: Two times a day (BID) | INTRAMUSCULAR | Status: DC
  Administered 2021-07-31 – 2021-08-22 (×16): 30 mg via SUBCUTANEOUS
  Filled 2021-07-30 (×40): qty 0.3

## 2021-07-30 MED ORDER — HYDROMORPHONE HCL 1 MG/ML IJ SOLN
0.5000 mg | Freq: Once | INTRAMUSCULAR | Status: AC
  Administered 2021-07-30: 0.5 mg via INTRAVENOUS
  Filled 2021-07-30: qty 1

## 2021-07-30 MED ORDER — IOHEXOL 300 MG/ML  SOLN
100.0000 mL | Freq: Once | INTRAMUSCULAR | Status: AC | PRN
  Administered 2021-07-30: 100 mL via INTRAVENOUS
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
## 2021-07-30 NOTE — ED Triage Notes (Signed)
Patient coming from home, complaint of nausea, vomiting, shortness of breath since yesterday, was here recently to have his colostomy reversed. ?

## 2021-07-30 NOTE — ED Notes (Signed)
This RN attempted NG tube administration. Pt was able to tolerate tube enough to get roughly of emesis out but refused to keep tube in and hooked up to suction. Pt reports that "last time they had me knocked out." Pt does report moderate relief  ?

## 2021-07-30 NOTE — ED Notes (Signed)
NG tube placement attempted but patient not able to tolerate it, states he needs sedation before anyone else try to attempt again. ?

## 2021-07-30 NOTE — ED Provider Triage Note (Signed)
Emergency Medicine Provider Triage Evaluation Note ? ?Alejandro Shelton , a 36 y.o. male  was evaluated in triage.  Pt complains of nausea, NBNB emesis, constipation, and SOB x last week. Pt reports he had his colostomy taken down last week. Per chart review colostomy take down on 03/21. Pt reports he has not had a BM since that time. ? ?Review of Systems  ?Positive: + abd pain, nausea, vomiting, constipation, SOB ?Negative: - chest pain ? ?Physical Exam  ?There were no vitals taken for this visit. ?Gen:   Awake, diaphoretic ?Resp:  Normal effort  ?MSK:   Moves extremities without difficulty  ?Other:  Steristrips overlying surgical scars to abdomen. Bowel sounds absent.  ? ?Medical Decision Making  ?Medically screening exam initiated at 1:46 PM.  Appropriate orders placed.  Alejandro Shelton was informed that the remainder of the evaluation will be completed by another provider, this initial triage assessment does not replace that evaluation, and the importance of remaining in the ED until their evaluation is complete. ? ? ?  ?Tanda Rockers, PA-C ?07/30/21 1349 ? ?
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
## 2021-07-31 ENCOUNTER — Encounter: Payer: Managed Care, Other (non HMO) | Admitting: Internal Medicine

## 2021-07-31 ENCOUNTER — Inpatient Hospital Stay: Payer: Self-pay

## 2021-07-31 ENCOUNTER — Inpatient Hospital Stay (HOSPITAL_COMMUNITY): Payer: Commercial Managed Care - HMO

## 2021-07-31 ENCOUNTER — Encounter (HOSPITAL_COMMUNITY): Payer: Self-pay

## 2021-07-31 LAB — BASIC METABOLIC PANEL
Anion gap: 9 (ref 5–15)
BUN: 22 mg/dL — ABNORMAL HIGH (ref 6–20)
CO2: 25 mmol/L (ref 22–32)
Calcium: 8.9 mg/dL (ref 8.9–10.3)
Chloride: 94 mmol/L — ABNORMAL LOW (ref 98–111)
Creatinine, Ser: 1 mg/dL (ref 0.61–1.24)
GFR, Estimated: >60 mL/min (ref >60)
Glucose, Bld: 117 mg/dL — ABNORMAL HIGH (ref 70–99)
Potassium: 4.1 mmol/L (ref 3.5–5.1)
Sodium: 128 mmol/L — ABNORMAL LOW (ref 135–145)

## 2021-07-31 LAB — CBC
HCT: 34.5 % — ABNORMAL LOW (ref 39.0–52.0)
Hemoglobin: 11.7 g/dL — ABNORMAL LOW (ref 13.0–17.0)
MCH: 27.9 pg (ref 26.0–34.0)
MCHC: 33.9 g/dL (ref 30.0–36.0)
MCV: 82.1 fL (ref 80.0–100.0)
Platelets: 498 10*3/uL — ABNORMAL HIGH (ref 150–400)
RBC: 4.2 MIL/uL — ABNORMAL LOW (ref 4.22–5.81)
RDW: 14.4 % (ref 11.5–15.5)
WBC: 10.2 10*3/uL (ref 4.0–10.5)
nRBC: 0 % (ref 0.0–0.2)

## 2021-07-31 LAB — PREALBUMIN: Prealbumin: 17.8 mg/dL — ABNORMAL LOW (ref 18–38)

## 2021-07-31 LAB — MAGNESIUM: Magnesium: 2.1 mg/dL (ref 1.7–2.4)

## 2021-07-31 MED ORDER — CHLORHEXIDINE GLUCONATE CLOTH 2 % EX PADS
6.0000 | MEDICATED_PAD | Freq: Every day | CUTANEOUS | Status: DC
  Administered 2021-07-31 – 2021-08-26 (×24): 6 via TOPICAL

## 2021-07-31 MED ORDER — SODIUM CHLORIDE 0.9% FLUSH
10.0000 mL | Freq: Two times a day (BID) | INTRAVENOUS | Status: DC
  Administered 2021-07-31: 10 mL
  Administered 2021-08-01: 40 mL
  Administered 2021-08-01 – 2021-08-25 (×35): 10 mL

## 2021-07-31 MED ORDER — METOPROLOL TARTRATE 5 MG/5ML IV SOLN
5.0000 mg | Freq: Four times a day (QID) | INTRAVENOUS | Status: DC | PRN
  Administered 2021-07-31: 5 mg via INTRAVENOUS
  Filled 2021-07-31: qty 5

## 2021-07-31 MED ORDER — SODIUM CHLORIDE 0.9% FLUSH: 10.0000 mL | INTRAVENOUS | Status: DC | PRN

## 2021-07-31 MED ORDER — LIDOCAINE 5 % EX PTCH
1.0000 | MEDICATED_PATCH | CUTANEOUS | Status: DC
  Administered 2021-07-31 – 2021-08-25 (×25): 1 via TRANSDERMAL
  Filled 2021-07-31 (×26): qty 1

## 2021-07-31 MED ORDER — CALCIUM GLUCONATE-NACL 2-0.675 GM/100ML-% IV SOLN
2.0000 g | Freq: Once | INTRAVENOUS | Status: AC
  Administered 2021-07-31: 2000 mg via INTRAVENOUS
  Filled 2021-07-31: qty 100
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
## 2021-07-31 NOTE — Plan of Care (Signed)

## 2021-07-31 NOTE — ED Notes (Signed)
Sent message to pharmacy to adjust IV ACTM doses to 6 hours apart d/t last dose given by prior nurse @0147  I am not able to give 0600 dose til 0747 must be 6 hours a part. ?

## 2021-07-31 NOTE — Progress Notes (Signed)
Patient received from ED via stretcher.  Patient is alert and oriented x 4.  Ambulated to the bathroom and back to bed, tolerated activity well.  Oriented to room and unit routine.  NGT hooked up to ILWS.  Needs addressed. ?

## 2021-07-31 NOTE — Progress Notes (Addendum)
PICC troubleshooting: Message from RN reporting PICC is occluded. Intact dressing changed, line pulled back 2 cm while trying to flush. Unable to flush/ no blood return. Chest Xray recommended. Discussed with Pamelia Hoit, RN at time of assessment. RN reports he paged covering MD.  ?

## 2021-07-31 NOTE — Progress Notes (Signed)
Peripherally Inserted Central Catheter Placement ? ?The IV Nurse has discussed with the patient and/or persons authorized to consent for the patient, the purpose of this procedure and the potential benefits and risks involved with this procedure.  The benefits include less needle sticks, lab draws from the catheter, and the patient may be discharged home with the catheter. Risks include, but not limited to, infection, bleeding, blood clot (thrombus formation), and puncture of an artery; nerve damage and irregular heartbeat and possibility to perform a PICC exchange if needed/ordered by physician.  Alternatives to this procedure were also discussed.  Bard Power PICC patient education guide, fact sheet on infection prevention and patient information card has been provided to patient /or left at bedside.   ? ?PICC Placement Documentation  ?PICC Double Lumen 07/31/21 Right Brachial 42 cm 0 cm (Active)  ?Indication for Insertion or Continuance of Line Administration of hyperosmolar/irritating solutions (i.e. TPN, Vancomycin, etc.) 07/31/21 2100  ?Exposed Catheter (cm) 0 cm 07/31/21 2100  ?Site Assessment Clean, Dry, Intact 07/31/21 2100  ?Lumen #1 Status Saline locked;Flushed;Blood return noted 07/31/21 2100  ?Lumen #2 Status Saline locked;Flushed;Blood return noted 07/31/21 2100  ?Dressing Type Securing device;Transparent 07/31/21 2100  ?Dressing Status Antimicrobial disc in place;Clean, Dry, Intact 07/31/21 2100  ?Safety Lock Not Applicable [id number]  ?Line Care Connections checked and tightened 07/31/21 2100  ?Line Adjustment (NICU/IV Team Only) No 07/31/21 2100  ?Dressing Intervention New dressing 07/31/21 2100  ?Dressing Change Due 08/07/21 07/31/21 2100  ? ? ? ? ? ?Alejandro Shelton ?07/31/2021, 9:11 PM ? ?

## 2021-07-31 NOTE — Progress Notes (Addendum)
? ? ?   ?Subjective: ?CC: ?NG tube placed last night.  700 cc bilious output in canister.  Patient reports since placement has SOB, abdominal pain, abdominal distention, and nausea have completely resolved.  He feels more energized after receiving IV fluid.  Voiding.  Notes no flatus or BM since discharge and inability to take p.o. for 2 days PTA. ? ?Objective: ?Vital signs in last 24 hours: ?Temp:  [99.3 ?F (37.4 ?C)] 99.3 ?F (37.4 ?C) (04/10 1755) ?Pulse Rate:  [109-139] 109 (04/11 0830) ?Resp:  [11-30] 13 (04/11 0830) ?BP: (126-156)/(82-115) 139/89 (04/11 0830) ?SpO2:  [94 %-100 %] 99 % (04/11 0830) ?  ? ?Intake/Output from previous day: ?04/10 0701 - 04/11 0700 ?In: 1560.7 [I.V.:265.5; IV Piggyback:1295.1] ?Out: 2800 [Urine:300; Emesis/NG output:500] ?Intake/Output this shift: ?Total I/O ?In: 100 [IV Piggyback:100] ?Out: -  ? ?PE: ?Gen:  Alert, NAD, pleasant ?Card:  Tachycardic with regular rhythm ?Pulm:  CTAB, no W/R/R, effort normal ?Abd: Soft w/ mild distension. NT. Hypoactive BS. Incisions c/d/I. NGT w/ bilious output.  ?Ext:  MAE's ?Psych: A&Ox3  ?Skin: no rashes noted, warm and dry ? ?Lab Results:  ?Recent Labs  ?  07/30/21 ?1358 07/30/21 ?1801 07/31/21 ?0522  ?WBC 12.7*  --  10.2  ?HGB 13.8 13.9 11.7*  ?HCT 41.6 41.0 34.5*  ?PLT 298  --  498*  ? ?BMET ?Recent Labs  ?  07/30/21 ?1358 07/30/21 ?1801 07/31/21 ?0522  ?NA 130* 127* 128*  ?K 4.2 4.5 4.1  ?CL 88* 87* 94*  ?CO2 27  --  25  ?GLUCOSE 132* 113* 117*  ?BUN 24* 27* 22*  ?CREATININE 1.18 1.10 1.00  ?CALCIUM 9.9  --  8.9  ? ?PT/INR ?No results for input(s): LABPROT, INR in the last 72 hours. ?CMP  ?   ?Component Value Date/Time  ? NA 128 (L) 07/31/2021 0522  ? NA 138 02/09/2021 1036  ? K 4.1 07/31/2021 0522  ? CL 94 (L) 07/31/2021 0522  ? CO2 25 07/31/2021 0522  ? GLUCOSE 117 (H) 07/31/2021 0522  ? BUN 22 (H) 07/31/2021 0522  ? BUN 11 02/09/2021 1036  ? CREATININE 1.00 07/31/2021 0522  ? CALCIUM 8.9 07/31/2021 0522  ? PROT 8.6 (H) 07/30/2021 1358  ?  ALBUMIN 4.0 07/30/2021 1358  ? AST 31 07/30/2021 1358  ? ALT 47 (H) 07/30/2021 1358  ? ALKPHOS 77 07/30/2021 1358  ? BILITOT 0.7 07/30/2021 1358  ? GFRNONAA >60 07/31/2021 0522  ? GFRAA >90 12/12/2012 0526  ? ?Lipase  ?   ?Component Value Date/Time  ? LIPASE 48 07/30/2021 1358  ? ? ?Studies/Results: ?CT Abdomen Pelvis W Contrast ? ?Result Date: 07/30/2021 ?CLINICAL DATA:  Recent abdominal surgery, colostomy reversal. Nausea and vomiting. Small-bowel obstruction by radiography. EXAM: CT ABDOMEN AND PELVIS WITH CONTRAST TECHNIQUE: Multidetector CT imaging of the abdomen and pelvis was performed using the standard protocol following bolus administration of intravenous contrast. RADIATION DOSE REDUCTION: This exam was performed according to the departmental dose-optimization program which includes automated exposure control, adjustment of the mA and/or kV according to patient size and/or use of iterative reconstruction technique. CONTRAST:  OMNIPAQUE IOHEXOL 300 MG/ML  SOLN COMPARISON:  07/16/2021.  10/08/2020. FINDINGS: Lower chest: Lung bases are clear. Hepatobiliary: Chronic scar of the right lobe of the liver. No acute liver finding. Gallbladder appears normal. Pancreas: Normal Spleen: Normal Adrenals/Urinary Tract: Adrenal glands are normal. Left kidney is normal. 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 Bladder is normal. Stomach/Bowel: Fluid-filled distended stomach. Multiple  loops of fluid-filled distended small intestine, diameter up to 6.5 cm. Distal small bowel and colon are collapsed, consistent with high-grade small bowel obstruction. Vascular/Lymphatic: Aorta and IVC are normal.  No adenopathy. Reproductive: Normal Other: No free fluid or free air. Musculoskeletal: No acute skeletal finding. IMPRESSION: High-grade small bowel obstruction. Diameter of the small intestine up to 6.5 cm. No free air. Stomach is distended in full of fluid as well. Electronically Signed   By: Paulina Fusi M.D.    On: 07/30/2021 15:17  ? ?DG Abdomen Acute W/Chest ? ?Result Date: 07/30/2021 ?CLINICAL DATA:  Postoperative nausea and vomiting. Recent colostomy reversal. EXAM: DG ABDOMEN ACUTE WITH 1 VIEW CHEST COMPARISON:  07/21/2021 FINDINGS: There are markedly dilated fluid and air-filled loops of small intestine, dilated up to approximately 8 cm, consistent with high-grade small bowel obstruction. No free air is shown. Heart and mediastinal shadows are normal. The lungs are clear. IMPRESSION: High-grade small bowel obstruction.  No free air is demonstrated. Electronically Signed   By: Paulina Fusi M.D.   On: 07/30/2021 15:13  ? ?DG Abd Portable 1V ? ?Result Date: 07/31/2021 ?CLINICAL DATA:  Nasogastric tube placement. EXAM: PORTABLE ABDOMEN - 1 VIEW COMPARISON:  July 30, 2021 FINDINGS: A nasogastric tube is seen with its distal tip overlying the expected region of the gastric fundus. The bowel gas pattern is normal. No radio-opaque calculi or other significant radiographic abnormality are seen. IMPRESSION: Nasogastric tube positioning, as described above. Electronically Signed   By: Aram Candela M.D.   On: 07/31/2021 00:17   ? ?Anti-infectives: ?Anti-infectives (From admission, onward)  ? ? None  ? ?  ? ? ? ?Assessment/Plan ?POD 21 s/p colostomy takedown w/ 30 min LOA by Dr. Janee Morn on 07/10/21.  ?SBO vs Ileus  ?- CT A/P high-grade small bowel obstruction w/ diameter of the small intestine up to 6.5 cm. No free air.  ?- Question ileus vs early post op SBO. Continue conservative management. No indication for emergency surgery. ?- Cont NGT and IVF resuscitation.  ?- Keep K > 4 and Mg > 2 and mobilize for bowel function ?- Start TPN (checking pre-alb, albumin 4.0)  ?- SBO protocol tomorrow, allow further decompression today ?Tachy/HTN - Was discharged on lopressor. Tachycardia improved some after IVF. Change parameters for PRN IV Metoprolol. May need to schedule IV Metoprolol. Will monitor for the next 24 hours.   ?Hyponatremia - NS IVF. Recheck in AM.  ?  ?FEN - NPO, NGT, IVF ?VTE - SCDs, Lovenox ?ID - None  ?Foley - None, monitor I/O, voiding ?Dispo - Admit to inpatient  ? ? LOS: 1 day  ? ? ?Jacinto Halim , PA-C ?[facility] ?07/31/2021, 9:02 AM ?Please see Amion for pager number during day hours 7:00am-4:30pm ? ?
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
## 2021-07-31 NOTE — Progress Notes (Signed)
?  Transition of Care (TOC) Screening Note ? ? ?Patient Details  ?Name: Alejandro Shelton ?Date of Birth: 1986/02/05 ? ? ?Transition of Care [facility]) CM/SW Contact:    ?Glennon Mac, RN ?Phone Number: ?07/31/2021, 5:07 PM ? ? ? ?Transition of Care Department [facility]) has reviewed patient and no TOC needs have been identified at this time. We will continue to monitor patient advancement through interdisciplinary progression rounds. If new patient transition needs arise, please place a TOC consult. ? ?Quintella Baton, RN, BSN  ?Trauma/Neuro ICU Case Manager ?[phone] ? ?
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
## 2021-07-31 NOTE — Progress Notes (Signed)
Patient has been yellow MEWS since 07/30/21.  Patient received from ED in no distress. ? ? 07/31/21 1539  ?Assess: MEWS Score  ?Temp 99 ?F (37.2 ?C)  ?BP (!) 148/99  ?Pulse Rate (!) 118  ?Resp 18  ?Level of Consciousness Alert  ?SpO2 100 %  ?O2 Device Room Air  ?Assess: MEWS Score  ?MEWS Temp 0  ?MEWS Systolic 0  ?MEWS Pulse 2  ?MEWS RR 0  ?MEWS LOC 0  ?MEWS Score 2  ?MEWS Score Color Yellow  ?Assess: if the MEWS score is Yellow or Red  ?Were vital signs taken at a resting state? Yes  ?Focused Assessment No change from prior assessment  ?Early Detection of Sepsis Score *See Row Information* Low  ?MEWS guidelines implemented *See Row Information* No, previously yellow, continue vital signs every 4 hours  ?Treat  ?MEWS Interventions Administered prn meds/treatments  ?Pain Scale 0-10  ?Pain Score 8  ?Pain Type Chronic pain;Surgical pain  ?Pain Location Abdomen  ?Pain Orientation Mid  ?Pain Descriptors / Indicators Aching;Burning;Discomfort;Sharp  ?Pain Frequency Constant  ?Pain Onset On-going  ?Patients Stated Pain Goal 3  ?Pain Intervention(s) Medication (See eMAR)  ?Multiple Pain Sites No  ?Breathing 0  ?Negative Vocalization 0  ?Facial Expression 0  ?Body Language 0  ?Consolability 0  ?PAINAD Score 0  ?Notify: Charge Nurse/RN  ?Name of Charge Nurse/RN Notified Alferd Apa RN  ?Date Charge Nurse/RN Notified 07/31/21  ?Time Charge Nurse/RN Notified 1550  ?Notify: Provider  ?Provider Name/Title Not applicable, trauma attending has been aware  ?Notify: Rapid Response  ?Name of Rapid Response RN Notified Not applicable  ?Document  ?Patient Outcome Other (Comment) ?(Patient is stable and in no distress at this time.)  ? ? ?

## 2021-08-01 ENCOUNTER — Inpatient Hospital Stay (HOSPITAL_COMMUNITY): Payer: Commercial Managed Care - HMO

## 2021-08-01 DIAGNOSIS — E43 Unspecified severe protein-calorie malnutrition: Secondary | ICD-10-CM | POA: Insufficient documentation

## 2021-08-01 LAB — CBC
HCT: 31.8 % — ABNORMAL LOW (ref 39.0–52.0)
Hemoglobin: 10.8 g/dL — ABNORMAL LOW (ref 13.0–17.0)
MCH: 28.1 pg (ref 26.0–34.0)
MCHC: 34 g/dL (ref 30.0–36.0)
MCV: 82.6 fL (ref 80.0–100.0)
Platelets: 478 10*3/uL — ABNORMAL HIGH (ref 150–400)
RBC: 3.85 MIL/uL — ABNORMAL LOW (ref 4.22–5.81)
RDW: 14.3 % (ref 11.5–15.5)
WBC: 8.2 10*3/uL (ref 4.0–10.5)
nRBC: 0 % (ref 0.0–0.2)

## 2021-08-01 LAB — GLUCOSE, CAPILLARY: Glucose-Capillary: 91 mg/dL (ref 70–99)

## 2021-08-01 LAB — BASIC METABOLIC PANEL
Anion gap: 9 (ref 5–15)
BUN: 19 mg/dL (ref 6–20)
CO2: 24 mmol/L (ref 22–32)
Calcium: 8.7 mg/dL — ABNORMAL LOW (ref 8.9–10.3)
Chloride: 101 mmol/L (ref 98–111)
Creatinine, Ser: 0.95 mg/dL (ref 0.61–1.24)
GFR, Estimated: >60 mL/min (ref >60)
Glucose, Bld: 86 mg/dL (ref 70–99)
Potassium: 4 mmol/L (ref 3.5–5.1)
Sodium: 134 mmol/L — ABNORMAL LOW (ref 135–145)

## 2021-08-01 LAB — PHOSPHORUS: Phosphorus: 3.5 mg/dL (ref 2.5–4.6)

## 2021-08-01 LAB — HEMOGLOBIN A1C
Hgb A1c MFr Bld: 4.7 % — ABNORMAL LOW (ref 4.8–5.6)
Mean Plasma Glucose: 88.19 mg/dL

## 2021-08-01 LAB — MAGNESIUM: Magnesium: 2 mg/dL (ref 1.7–2.4)

## 2021-08-01 MED ORDER — SODIUM CHLORIDE 0.9 % IV SOLN: INTRAVENOUS | Status: AC

## 2021-08-01 MED ORDER — TRAVASOL 10 % IV SOLN
INTRAVENOUS | Status: AC
  Filled 2021-08-01: qty 594

## 2021-08-01 MED ORDER — INSULIN ASPART 100 UNIT/ML IJ SOLN: 0.0000 [IU] | Freq: Four times a day (QID) | INTRAMUSCULAR | Status: DC

## 2021-08-01 MED ORDER — DEXTROSE 5 % IV SOLN
1000.0000 mg | Freq: Three times a day (TID) | INTRAVENOUS | Status: DC
  Administered 2021-08-01 – 2021-08-13 (×35): 1000 mg via INTRAVENOUS
  Filled 2021-08-01 (×5): qty 10
  Filled 2021-08-01: qty 1000
  Filled 2021-08-01 (×11): qty 10
  Filled 2021-08-01: qty 1000
  Filled 2021-08-01 (×8): qty 10
  Filled 2021-08-01: qty 1000
  Filled 2021-08-01 (×4): qty 10
  Filled 2021-08-01: qty 1000
  Filled 2021-08-01 (×2): qty 10
  Filled 2021-08-01: qty 1000
  Filled 2021-08-01: qty 10
  Filled 2021-08-01: qty 1000
  Filled 2021-08-01 (×3): qty 10

## 2021-08-01 MED ORDER — HYDROMORPHONE HCL 1 MG/ML IJ SOLN
1.0000 mg | INTRAMUSCULAR | Status: DC | PRN
  Administered 2021-08-01 – 2021-08-03 (×22): 2 mg via INTRAVENOUS
  Administered 2021-08-04: 1 mg via INTRAVENOUS
  Administered 2021-08-04: 2 mg via INTRAVENOUS
  Administered 2021-08-04 (×2): 1 mg via INTRAVENOUS
  Administered 2021-08-04: 2 mg via INTRAVENOUS
  Administered 2021-08-04 (×2): 1 mg via INTRAVENOUS
  Administered 2021-08-04: 2 mg via INTRAVENOUS
  Administered 2021-08-04 (×2): 1 mg via INTRAVENOUS
  Administered 2021-08-05 (×6): 2 mg via INTRAVENOUS
  Administered 2021-08-05: 1 mg via INTRAVENOUS
  Administered 2021-08-05 – 2021-08-08 (×31): 2 mg via INTRAVENOUS
  Administered 2021-08-08: 1 mg via INTRAVENOUS
  Administered 2021-08-08 – 2021-08-10 (×13): 2 mg via INTRAVENOUS
  Administered 2021-08-10: 1 mg via INTRAVENOUS
  Administered 2021-08-10 (×3): 2 mg via INTRAVENOUS
  Administered 2021-08-10: 1 mg via INTRAVENOUS
  Administered 2021-08-10: 2 mg via INTRAVENOUS
  Administered 2021-08-10 (×3): 1 mg via INTRAVENOUS
  Administered 2021-08-11 – 2021-08-13 (×24): 2 mg via INTRAVENOUS
  Filled 2021-08-01 (×2): qty 2
  Filled 2021-08-01: qty 1
  Filled 2021-08-01 (×2): qty 2
  Filled 2021-08-01: qty 1
  Filled 2021-08-01 (×26): qty 2
  Filled 2021-08-01 (×2): qty 1
  Filled 2021-08-01 (×4): qty 2
  Filled 2021-08-01: qty 1
  Filled 2021-08-01 (×4): qty 2
  Filled 2021-08-01: qty 1
  Filled 2021-08-01 (×8): qty 2
  Filled 2021-08-01 (×3): qty 1
  Filled 2021-08-01 (×10): qty 2
  Filled 2021-08-01: qty 1
  Filled 2021-08-01 (×16): qty 2
  Filled 2021-08-01: qty 1
  Filled 2021-08-01 (×14): qty 2
  Filled 2021-08-01 (×2): qty 1
  Filled 2021-08-01 (×2): qty 2
  Filled 2021-08-01: qty 1
  Filled 2021-08-01 (×7): qty 2
  Filled 2021-08-01: qty 1
  Filled 2021-08-01 (×8): qty 2
  Filled 2021-08-01: qty 1
  Filled 2021-08-01: qty 2

## 2021-08-01 MED ORDER — TRAVASOL 10 % IV SOLN: INTRAVENOUS | Status: DC

## 2021-08-01 MED ORDER — ACETAMINOPHEN 10 MG/ML IV SOLN
1000.0000 mg | Freq: Four times a day (QID) | INTRAVENOUS | Status: AC
  Administered 2021-08-01 – 2021-08-02 (×4): 1000 mg via INTRAVENOUS
  Filled 2021-08-01 (×4): qty 100

## 2021-08-01 MED ORDER — BENZOCAINE 20 % MT AERO
INHALATION_SPRAY | Freq: Four times a day (QID) | OROMUCOSAL | Status: DC | PRN
  Filled 2021-08-01: qty 57

## 2021-08-01 MED ORDER — METOPROLOL TARTRATE 5 MG/5ML IV SOLN
7.5000 mg | Freq: Four times a day (QID) | INTRAVENOUS | Status: DC
  Administered 2021-08-01 – 2021-08-16 (×58): 7.5 mg via INTRAVENOUS
  Filled 2021-08-01 (×59): qty 10

## 2021-08-01 MED ORDER — PHENOL 1.4 % MT LIQD
1.0000 | OROMUCOSAL | Status: DC | PRN
  Filled 2021-08-01: qty 177
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
## 2021-08-01 NOTE — Progress Notes (Signed)
Mobility Specialist Progress Note: ? ? 08/01/21 1636  ?Mobility  ?Activity Stood at bedside  ?Level of Assistance Independent  ?Assistive Device None  ?Distance Ambulated (ft) 4 ft  ?Activity Response Tolerated fair  ?$Mobility charge 1 Mobility  ? ?Pt received in bed. In a lot of pain and not wanting to ambulate. Stood at bedside for standing weight. Left in bed with call bell in reach and all needs met.  ? ?Lyda Colcord ?Mobility Specialist ?Primary Phone [phone] ? ?

## 2021-08-01 NOTE — Progress Notes (Signed)
? ?Progress Note ? ?   ?Subjective: ?Pt reports he is still not passing flatus and has not had any bowel function. He reports he has been mobilizing some, agreeable to use bouncing rocker again. He has a lot of gas-like pain. He is stressed about possibly losing his job and the financial strain of being in the hospital.  ? ?Objective: ?Vital signs in last 24 hours: ?Temp:  [98.3 ?F (36.8 ?C)-99 ?F (37.2 ?C)] 98.7 ?F (37.1 ?C) (04/12 4967) ?Pulse Rate:  [102-118] 107 (04/12 0708) ?Resp:  [12-19] 16 (04/12 0708) ?BP: (136-154)/(97-112) 154/112 (04/12 0708) ?SpO2:  [97 %-100 %] 100 % (04/12 0708) ?Weight:  [76.6 kg-79.4 kg] 79.4 kg (04/11 1536) ?Last BM Date : 07/23/21 ? ?Intake/Output from previous day: ?04/11 0701 - 04/12 0700 ?In: 1712.5 [P.O.:60; I.V.:1352.5; IV Piggyback:300] ?Out: 2150 [Emesis/NG output:2150] ?Intake/Output this shift: ?Total I/O ?In: -  ?Out: 600 [Urine:600] ? ?PE: ?Gen:  Alert, NAD, pleasant ?Card:  Tachycardic with regular rhythm ?Pulm:  CTAB, no W/R/R, effort normal ?Abd: Soft w/ mild distension. NT. Hypoactive BS. Incisions c/d/I. NGT w/ bilious output.  ?Ext:  MAE's ?Psych: A&Ox3  ?Skin: no rashes noted, warm and dry ? ? ?Lab Results:  ?Recent Labs  ?  07/31/21 ?0522 08/01/21 ?0108  ?WBC 10.2 8.2  ?HGB 11.7* 10.8*  ?HCT 34.5* 31.8*  ?PLT 498* 478*  ? ?BMET ?Recent Labs  ?  07/31/21 ?0522 08/01/21 ?0108  ?NA 128* 134*  ?K 4.1 4.0  ?CL 94* 101  ?CO2 25 24  ?GLUCOSE 117* 86  ?BUN 22* 19  ?CREATININE 1.00 0.95  ?CALCIUM 8.9 8.7*  ? ?PT/INR ?No results for input(s): LABPROT, INR in the last 72 hours. ?CMP  ?   ?Component Value Date/Time  ? NA 134 (L) 08/01/2021 0108  ? NA 138 02/09/2021 1036  ? K 4.0 08/01/2021 0108  ? CL 101 08/01/2021 0108  ? CO2 24 08/01/2021 0108  ? GLUCOSE 86 08/01/2021 0108  ? BUN 19 08/01/2021 0108  ? BUN 11 02/09/2021 1036  ? CREATININE 0.95 08/01/2021 0108  ? CALCIUM 8.7 (L) 08/01/2021 0108  ? PROT 8.6 (H) 07/30/2021 1358  ? ALBUMIN 4.0 07/30/2021 1358  ? AST 31  07/30/2021 1358  ? ALT 47 (H) 07/30/2021 1358  ? ALKPHOS 77 07/30/2021 1358  ? BILITOT 0.7 07/30/2021 1358  ? GFRNONAA >60 08/01/2021 0108  ? GFRAA >90 12/12/2012 0526  ? ?Lipase  ?   ?Component Value Date/Time  ? LIPASE 48 07/30/2021 1358  ? ? ? ? ? ?Studies/Results: ?CT Abdomen Pelvis W Contrast ? ?Result Date: 07/30/2021 ?CLINICAL DATA:  Recent abdominal surgery, colostomy reversal. Nausea and vomiting. Small-bowel obstruction by radiography. EXAM: CT ABDOMEN AND PELVIS WITH CONTRAST TECHNIQUE: Multidetector CT imaging of the abdomen and pelvis was performed using the standard protocol following bolus administration of intravenous contrast. RADIATION DOSE REDUCTION: This exam was performed according to the departmental dose-optimization program which includes automated exposure control, adjustment of the mA and/or kV according to patient size and/or use of iterative reconstruction technique. CONTRAST:  OMNIPAQUE IOHEXOL 300 MG/ML  SOLN COMPARISON:  07/16/2021.  10/08/2020. FINDINGS: Lower chest: Lung bases are clear. Hepatobiliary: Chronic scar of the right lobe of the liver. No acute liver finding. Gallbladder appears normal. Pancreas: Normal Spleen: Normal Adrenals/Urinary Tract: Adrenal glands are normal. Left kidney is normal. 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 Bladder is normal. Stomach/Bowel: Fluid-filled distended stomach. Multiple loops of fluid-filled distended small intestine, diameter up to 6.5  cm. Distal small bowel and colon are collapsed, consistent with high-grade small bowel obstruction. Vascular/Lymphatic: Aorta and IVC are normal.  No adenopathy. Reproductive: Normal Other: No free fluid or free air. Musculoskeletal: No acute skeletal finding. IMPRESSION: High-grade small bowel obstruction. Diameter of the small intestine up to 6.5 cm. No free air. Stomach is distended in full of fluid as well. Electronically Signed   By: Paulina Fusi M.D.   On: 07/30/2021 15:17  ? ?DG  CHEST PORT 1 VIEW ? ?Result Date: 08/01/2021 ?CLINICAL DATA:  Status post central line placement EXAM: PORTABLE CHEST 1 VIEW COMPARISON:  07/11/2021 FINDINGS: Nasogastric tube with the tip projecting over the stomach. Right-sided PICC line with the tip projecting over the SVC. No focal consolidation. No pleural effusion or pneumothorax. Heart and mediastinal contours are unremarkable. No acute osseous abnormality. IMPRESSION: 1. Right-sided PICC line with the tip projecting over the SVC. Electronically Signed   By: Elige Ko M.D.   On: 08/01/2021 07:39  ? ?DG Abdomen Acute W/Chest ? ?Result Date: 07/30/2021 ?CLINICAL DATA:  Postoperative nausea and vomiting. Recent colostomy reversal. EXAM: DG ABDOMEN ACUTE WITH 1 VIEW CHEST COMPARISON:  07/21/2021 FINDINGS: There are markedly dilated fluid and air-filled loops of small intestine, dilated up to approximately 8 cm, consistent with high-grade small bowel obstruction. No free air is shown. Heart and mediastinal shadows are normal. The lungs are clear. IMPRESSION: High-grade small bowel obstruction.  No free air is demonstrated. Electronically Signed   By: Paulina Fusi M.D.   On: 07/30/2021 15:13  ? ?DG Abd Portable 1V ? ?Result Date: 07/31/2021 ?CLINICAL DATA:  Nasogastric tube placement. EXAM: PORTABLE ABDOMEN - 1 VIEW COMPARISON:  July 30, 2021 FINDINGS: A nasogastric tube is seen with its distal tip overlying the expected region of the gastric fundus. The bowel gas pattern is normal. No radio-opaque calculi or other significant radiographic abnormality are seen. IMPRESSION: Nasogastric tube positioning, as described above. Electronically Signed   By: Aram Candela M.D.   On: 07/31/2021 00:17  ? ?[place] EKG SITE RITE ? ?Result Date: 07/31/2021 ?If [employer] not attached, placement could not be confirmed due to current cardiac rhythm.  ? ?Anti-infectives: ?Anti-infectives (From admission, onward)  ? ? None  ? ?  ? ? ? ?Assessment/Plan ?POD 20 s/p colostomy  takedown w/ 30 min LOA by Dr. Janee Morn on 07/10/21.  ?SBO vs Ileus  ?- CT A/P high-grade small bowel obstruction w/ diameter of the small intestine up to 6.5 cm. No free air.  ?- Question ileus vs early post op SBO. Continue conservative management. No indication for emergency surgery. ?- Cont NGT and IVF resuscitation - NGT with 2L bilious output for last 24 hrs ?- Keep K > 4 and Mg > 2 and mobilize for bowel function ?- Start TPN (checking pre-alb, albumin 4.0)  ?- continue decompression for now  ?- mobilize as tolerated to help with bowel motility ?Tachy/HTN - Scheduled IV lopressor 7.5 mg q6h ?Hyponatremia - 134 this AM, improved  ?  ?FEN - NPO, NGT, IVF; TPN to start ?VTE - SCDs, Lovenox ?ID - None  ?Foley - None, monitor I/O, voiding ? ?Dispo - Med-surg, await bowel function.  ? LOS: 2 days  ? ? ?Juliet Rude, PA-C ?[facility] ?08/01/2021, 10:27 AM ?Please see Amion for pager number during day hours 7:00am-4:30pm ? ?

## 2021-08-01 NOTE — Plan of Care (Signed)
  Problem: Education: Goal: Knowledge of General Education information will improve Description: Including pain rating scale, medication(s)/side effects and non-pharmacologic comfort measures Outcome: Progressing   Problem: Clinical Measurements: Goal: Ability to maintain clinical measurements within normal limits will improve Outcome: Progressing   
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
## 2021-08-02 ENCOUNTER — Inpatient Hospital Stay (HOSPITAL_COMMUNITY): Payer: Commercial Managed Care - HMO

## 2021-08-02 LAB — GLUCOSE, CAPILLARY
Glucose-Capillary: 100 mg/dL — ABNORMAL HIGH (ref 70–99)
Glucose-Capillary: 107 mg/dL — ABNORMAL HIGH (ref 70–99)
Glucose-Capillary: 139 mg/dL — ABNORMAL HIGH (ref 70–99)
Glucose-Capillary: 83 mg/dL (ref 70–99)

## 2021-08-02 LAB — CBC
HCT: 31.3 % — ABNORMAL LOW (ref 39.0–52.0)
Hemoglobin: 10.2 g/dL — ABNORMAL LOW (ref 13.0–17.0)
MCH: 27.5 pg (ref 26.0–34.0)
MCHC: 32.6 g/dL (ref 30.0–36.0)
MCV: 84.4 fL (ref 80.0–100.0)
Platelets: 405 10*3/uL — ABNORMAL HIGH (ref 150–400)
RBC: 3.71 MIL/uL — ABNORMAL LOW (ref 4.22–5.81)
RDW: 14.4 % (ref 11.5–15.5)
WBC: 8.1 10*3/uL (ref 4.0–10.5)
nRBC: 0 % (ref 0.0–0.2)

## 2021-08-02 LAB — COMPREHENSIVE METABOLIC PANEL
ALT: 22 U/L (ref 0–44)
AST: 15 U/L (ref 15–41)
Albumin: 3 g/dL — ABNORMAL LOW (ref 3.5–5.0)
Alkaline Phosphatase: 46 U/L (ref 38–126)
Anion gap: 6 (ref 5–15)
BUN: 15 mg/dL (ref 6–20)
CO2: 26 mmol/L (ref 22–32)
Calcium: 8.5 mg/dL — ABNORMAL LOW (ref 8.9–10.3)
Chloride: 100 mmol/L (ref 98–111)
Creatinine, Ser: 0.88 mg/dL (ref 0.61–1.24)
GFR, Estimated: >60 mL/min (ref >60)
Glucose, Bld: 114 mg/dL — ABNORMAL HIGH (ref 70–99)
Potassium: 3.4 mmol/L — ABNORMAL LOW (ref 3.5–5.1)
Sodium: 132 mmol/L — ABNORMAL LOW (ref 135–145)
Total Bilirubin: 0.3 mg/dL (ref 0.3–1.2)
Total Protein: 6.3 g/dL — ABNORMAL LOW (ref 6.5–8.1)

## 2021-08-02 LAB — PHOSPHORUS: Phosphorus: 3.4 mg/dL (ref 2.5–4.6)

## 2021-08-02 LAB — MAGNESIUM: Magnesium: 1.9 mg/dL (ref 1.7–2.4)

## 2021-08-02 MED ORDER — MAGNESIUM SULFATE 2 GM/50ML IV SOLN
2.0000 g | Freq: Once | INTRAVENOUS | Status: AC
  Administered 2021-08-02: 2 g via INTRAVENOUS
  Filled 2021-08-02: qty 50

## 2021-08-02 MED ORDER — POTASSIUM CHLORIDE 10 MEQ/50ML IV SOLN
10.0000 meq | INTRAVENOUS | Status: AC
  Administered 2021-08-02 (×6): 10 meq via INTRAVENOUS
  Filled 2021-08-02 (×6): qty 50

## 2021-08-02 MED ORDER — PANTOPRAZOLE SODIUM 40 MG IV SOLR
40.0000 mg | INTRAVENOUS | Status: DC
  Administered 2021-08-02 – 2021-08-12 (×11): 40 mg via INTRAVENOUS
  Filled 2021-08-02 (×11): qty 10

## 2021-08-02 MED ORDER — TRAVASOL 10 % IV SOLN
INTRAVENOUS | Status: AC
  Filled 2021-08-02: qty 1248

## 2021-08-02 MED ORDER — LIDOCAINE HCL URETHRAL/MUCOSAL 2 % EX GEL
1.0000 | Freq: Once | CUTANEOUS | Status: AC
Start: 2021-08-02 — End: 2021-08-02
  Administered 2021-08-02: 1 via TOPICAL
  Filled 2021-08-02: qty 6

## 2021-08-02 MED ORDER — SODIUM CHLORIDE 0.9 % IV SOLN: INTRAVENOUS | Status: DC

## 2021-08-02 NOTE — Progress Notes (Signed)
Mobility Specialist Progress Note: ? ? 08/02/21 1431  ?Mobility  ?Activity Ambulated with assistance in hallway  ?Level of Assistance Independent after set-up  ?Assistive Device None  ?Distance Ambulated (ft) 1500 ft  ?Activity Response Tolerated well  ?$Mobility charge 1 Mobility  ? ?Pt received in bed willing to participate in mobility. Complaints of "a little" abdomen pain. Left in chair with call bell in reach and all needs met.  ? ?Alejandro Shelton ?Mobility Specialist ?Primary Phone [phone] ? ?
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
## 2021-08-02 NOTE — Progress Notes (Signed)
? ?Progress Note ? ?   ?Subjective: ?Pt reports he felt like NGT became dislodged overnight, film was done this AM and shows NGT in esophagus. He has had bloody discharge from NGT overnight. He feels more distended. No flatus or BM.  ? ?Objective: ?Vital signs in last 24 hours: ?Temp:  [98.4 ?F (36.9 ?C)-98.7 ?F (37.1 ?C)] 98.7 ?F (37.1 ?C) (04/13 0529) ?Pulse Rate:  [90-110] 94 (04/13 0529) ?Resp:  [16-17] 17 (04/13 0529) ?BP: (132-143)/(94-110) 140/99 (04/13 0529) ?SpO2:  [99 %-100 %] 100 % (04/13 0529) ?Weight:  [68.5 kg] 68.5 kg (04/12 1606) ?Last BM Date : 07/23/21 ? ?Intake/Output from previous day: ?04/12 0701 - 04/13 0700 ?In: 2033.9 [I.V.:1733.2; IV Piggyback:300.8] ?Out: 2600 [Urine:600; Emesis/NG output:2000] ?Intake/Output this shift: ?No intake/output data recorded. ? ?PE: ?Gen:  Alert, NAD, pleasant ?Card:  Tachycardic with regular rhythm ?Pulm:  CTAB, no W/R/R, effort normal ?Abd: Soft w/ distention. Appropriately TTP. Hypoactive BS. Incisions c/d/I. NGT w/ bilious output once advanced 30 cm  ?Ext:  MAE's ?Psych: A&Ox3  ?Skin: no rashes noted, warm and dry ? ? ?Lab Results:  ?Recent Labs  ?  08/01/21 ?0108 08/02/21 ?0444  ?WBC 8.2 8.1  ?HGB 10.8* 10.2*  ?HCT 31.8* 31.3*  ?PLT 478* 405*  ? ? ?BMET ?Recent Labs  ?  08/01/21 ?0108 08/02/21 ?0444  ?NA 134* 132*  ?K 4.0 3.4*  ?CL 101 100  ?CO2 24 26  ?GLUCOSE 86 114*  ?BUN 19 15  ?CREATININE 0.95 0.88  ?CALCIUM 8.7* 8.5*  ? ? ?PT/INR ?No results for input(s): LABPROT, INR in the last 72 hours. ?CMP  ?   ?Component Value Date/Time  ? NA 132 (L) 08/02/2021 0444  ? NA 138 02/09/2021 1036  ? K 3.4 (L) 08/02/2021 0444  ? CL 100 08/02/2021 0444  ? CO2 26 08/02/2021 0444  ? GLUCOSE 114 (H) 08/02/2021 0444  ? BUN 15 08/02/2021 0444  ? BUN 11 02/09/2021 1036  ? CREATININE 0.88 08/02/2021 0444  ? CALCIUM 8.5 (L) 08/02/2021 0444  ? PROT 6.3 (L) 08/02/2021 0444  ? ALBUMIN 3.0 (L) 08/02/2021 0444  ? AST 15 08/02/2021 0444  ? ALT 22 08/02/2021 0444  ? ALKPHOS 46  08/02/2021 0444  ? BILITOT 0.3 08/02/2021 0444  ? GFRNONAA >60 08/02/2021 0444  ? GFRAA >90 12/12/2012 0526  ? ?Lipase  ?   ?Component Value Date/Time  ? LIPASE 48 07/30/2021 1358  ? ? ? ? ? ?Studies/Results: ?DG CHEST PORT 1 VIEW ? ?Result Date: 08/01/2021 ?CLINICAL DATA:  Status post central line placement EXAM: PORTABLE CHEST 1 VIEW COMPARISON:  07/11/2021 FINDINGS: Nasogastric tube with the tip projecting over the stomach. Right-sided PICC line with the tip projecting over the SVC. No focal consolidation. No pleural effusion or pneumothorax. Heart and mediastinal contours are unremarkable. No acute osseous abnormality. IMPRESSION: 1. Right-sided PICC line with the tip projecting over the SVC. Electronically Signed   By: Elige Ko M.D.   On: 08/01/2021 07:39  ? ?DG Abd Portable 1V ? ?Result Date: 08/02/2021 ?CLINICAL DATA:  NG tube placement EXAM: PORTABLE ABDOMEN - 1 VIEW COMPARISON:  None. FINDINGS: NG tube tip is at the thoracic inlet, likely within the proximal esophagus. Right arm PICC with tip in the right atrium. Recommend advancement. Multiple dilated loops of bowel, compatible with small-bowel obstruction. Cardiac and mediastinal contours are within normal limits. Lungs are clear. No acute osseous abnormality. IMPRESSION: 1. NG tube tip is at the thoracic inlet, likely within the proximal esophagus.  Recommend advancement. 2. Right arm PICC with tip in the right atrium. These results will be called to the ordering clinician or representative by the Radiologist Assistant, and communication documented in the PACS or [employer]. Electronically Signed   By: Allegra Lai M.D.   On: 08/02/2021 07:56  ? ?[place] EKG SITE RITE ? ?Result Date: 07/31/2021 ?If [employer] not attached, placement could not be confirmed due to current cardiac rhythm.  ? ?Anti-infectives: ?Anti-infectives (From admission, onward)  ? ? None  ? ?  ? ? ? ?Assessment/Plan ?POD 23 s/p colostomy takedown w/ 30 min LOA by Dr.  Janee Morn on 07/10/21.  ?SBO vs Ileus  ?- CT A/P high-grade small bowel obstruction w/ diameter of the small intestine up to 6.5 cm. No free air.  ?- Question ileus vs early post op SBO. Continue conservative management. No indication for emergency surgery. ?- Cont NGT and IVF resuscitation - NGT with 2L bilious output for last 24 hrs ?- film with NGT in esophagus this AM - advanced ~30 cm, repeat film this AM ?- Keep K > 4 and Mg > 2 and mobilize for bowel function ?- Start TPN (checking pre-alb, albumin 4.0)  ?- continue decompression for now  ?- mobilize as tolerated to help with bowel motility ?Tachy/HTN - Scheduled IV lopressor 7.5 mg q6h ?Hyponatremia - 132 this AM, continue to monitor  ?  ?FEN - NPO, NGT, TPN ?VTE - SCDs, Lovenox ?ID - None  ?Foley - None, monitor I/O, voiding ? ?Dispo - Med-surg, await bowel function.  ? LOS: 3 days  ? ? ?Juliet Rude, PA-C ?[facility] ?08/02/2021, 8:15 AM ?Please see Amion for pager number during day hours 7:00am-4:30pm ? ?

## 2021-08-02 NOTE — Progress Notes (Signed)
Trauma Event Note ? ? ? ?TRN rounded on patient. Pt stable at this time, VS WDL. No needs at this time.  ? ?Last imported Vital Signs ?BP (!) 137/104 (BP Location: Left Arm)   Pulse 88   Temp 98.6 ?F (37 ?C) (Oral)   Resp 16   Ht 5\' 11"  (1.803 m)   Wt 150 lb 14.5 oz (68.5 kg)   SpO2 100%   BMI 21.05 kg/m?  ? ?Trending CBC ?Recent Labs  ?  07/31/21 ?0522 08/01/21 ?MR:[id number] 08/02/21 ?0444  ?WBC 10.2 8.2 8.1  ?HGB 11.7* 10.8* 10.2*  ?HCT 34.5* 31.8* 31.3*  ?PLT 498* 478* 405*  ? ? ?Trending Coag's ?No results for input(s): APTT, INR in the last 72 hours. ? ?Trending BMET ?Recent Labs  ?  07/31/21 ?0522 08/01/21 ?MR:[id number] 08/02/21 ?0444  ?NA 128* 134* 132*  ?K 4.1 4.0 3.4*  ?CL 94* 101 100  ?CO2 25 24 26   ?BUN 22* 19 15  ?CREATININE 1.00 0.95 0.88  ?GLUCOSE 117* 86 114*  ? ? ? ? ?[place]  ?Trauma Response RN ? ?Please call TRN at [phone] for further assistance. ? ? ?  ?

## 2021-08-02 NOTE — Plan of Care (Signed)
  Problem: Education: Goal: Knowledge of General Education information will improve Description: Including pain rating scale, medication(s)/side effects and non-pharmacologic comfort measures Outcome: Progressing   Problem: Clinical Measurements: Goal: Will remain free from infection Outcome: Progressing Goal: Diagnostic test results will improve Outcome: Progressing   

## 2021-08-03 LAB — GLUCOSE, CAPILLARY
Glucose-Capillary: 102 mg/dL — ABNORMAL HIGH (ref 70–99)
Glucose-Capillary: 106 mg/dL — ABNORMAL HIGH (ref 70–99)
Glucose-Capillary: 108 mg/dL — ABNORMAL HIGH (ref 70–99)

## 2021-08-03 LAB — CBC
HCT: 30.7 % — ABNORMAL LOW (ref 39.0–52.0)
Hemoglobin: 10 g/dL — ABNORMAL LOW (ref 13.0–17.0)
MCH: 27.8 pg (ref 26.0–34.0)
MCHC: 32.6 g/dL (ref 30.0–36.0)
MCV: 85.3 fL (ref 80.0–100.0)
Platelets: 400 10*3/uL (ref 150–400)
RBC: 3.6 MIL/uL — ABNORMAL LOW (ref 4.22–5.81)
RDW: 14.1 % (ref 11.5–15.5)
WBC: 7.6 10*3/uL (ref 4.0–10.5)
nRBC: 0 % (ref 0.0–0.2)

## 2021-08-03 LAB — BASIC METABOLIC PANEL
Anion gap: 5 (ref 5–15)
BUN: 11 mg/dL (ref 6–20)
CO2: 27 mmol/L (ref 22–32)
Calcium: 8.4 mg/dL — ABNORMAL LOW (ref 8.9–10.3)
Chloride: 99 mmol/L (ref 98–111)
Creatinine, Ser: 0.62 mg/dL (ref 0.61–1.24)
GFR, Estimated: >60 mL/min (ref >60)
Glucose, Bld: 119 mg/dL — ABNORMAL HIGH (ref 70–99)
Potassium: 3.6 mmol/L (ref 3.5–5.1)
Sodium: 131 mmol/L — ABNORMAL LOW (ref 135–145)

## 2021-08-03 LAB — MAGNESIUM: Magnesium: 2 mg/dL (ref 1.7–2.4)

## 2021-08-03 LAB — PHOSPHORUS: Phosphorus: 3.2 mg/dL (ref 2.5–4.6)

## 2021-08-03 MED ORDER — SIMETHICONE 40 MG/0.6ML PO SUSP
40.0000 mg | Freq: Four times a day (QID) | ORAL | Status: DC | PRN
  Administered 2021-08-03 – 2021-08-13 (×11): 40 mg
  Filled 2021-08-03 (×15): qty 0.6

## 2021-08-03 MED ORDER — TRAVASOL 10 % IV SOLN
INTRAVENOUS | Status: DC
  Filled 2021-08-03: qty 1248

## 2021-08-03 MED ORDER — TRAVASOL 10 % IV SOLN
INTRAVENOUS | Status: AC
  Filled 2021-08-03: qty 1248

## 2021-08-03 MED ORDER — POTASSIUM CHLORIDE 10 MEQ/50ML IV SOLN
10.0000 meq | INTRAVENOUS | Status: AC
  Administered 2021-08-03 (×4): 10 meq via INTRAVENOUS
  Filled 2021-08-03 (×4): qty 50

## 2021-08-03 MED ORDER — SIMETHICONE 80 MG PO CHEW
80.0000 mg | CHEWABLE_TABLET | Freq: Once | ORAL | Status: DC
Start: 2021-08-03 — End: 2021-08-03

## 2021-08-03 MED ORDER — HYDROMORPHONE HCL 1 MG/ML IJ SOLN
1.0000 mg | Freq: Once | INTRAMUSCULAR | Status: AC
  Administered 2021-08-03: 1 mg via INTRAVENOUS
  Filled 2021-08-03: qty 1

## 2021-08-03 MED ORDER — ACETAMINOPHEN 10 MG/ML IV SOLN
1000.0000 mg | Freq: Four times a day (QID) | INTRAVENOUS | Status: AC
  Administered 2021-08-03 – 2021-08-04 (×4): 1000 mg via INTRAVENOUS
  Filled 2021-08-03 (×4): qty 100

## 2021-08-03 MED ORDER — SIMETHICONE 80 MG PO CHEW: 80.0000 mg | CHEWABLE_TABLET | Freq: Four times a day (QID) | ORAL | Status: DC | PRN

## 2021-08-03 NOTE — Progress Notes (Signed)
Physical Therapy Discharge ?Patient Details ?Name: Alejandro Shelton ?MRN: [id number] ?DOB: [birth year] ?Today's Date: 08/03/2021 ?Time: 3903-0092 ?PT Time Calculation (min) (ACUTE ONLY): 9 min ? ?Patient discharged from PT services secondary to goals met and no further PT needs identified. ? ?Please see latest therapy progress note for current level of functioning and progress toward goals.   ? ?Progress and discharge plan discussed with patient and/or caregiver: Patient/Caregiver agrees with plan ? ?GP ?   ? ?Niranjan Rufener F [name] ?08/03/2021, 4:03 PM  ?Debhora Titus M,PT ?Acute Rehab Services ?[phone] ?[phone] (pager)  ?
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
## 2021-08-03 NOTE — Progress Notes (Signed)
Mobility Specialist Progress Note: ? ? 08/03/21 1308  ?Mobility  ?Activity Ambulated with assistance in hallway  ?Level of Assistance Independent after set-up  ?Assistive Device None  ?Distance Ambulated (ft) 120 ft  ?Activity Response Tolerated poorly  ?$Mobility charge 1 Mobility  ? ?Pt received in bed willing to ambulate. Complaints of gas pain. Pt could not stand the pain anymore and needed to go back to the room. Left pt in bed with call bell in reach and all needs met. Will follow-up for further ambulation as schedule permits.  ? ?Shajuan Musso ?Mobility Specialist ?Primary Phone [phone] ? ?

## 2021-08-03 NOTE — TOC CAGE-AID Note (Signed)
Transition of Care (TOC) - CAGE-AID Screening ? ? ?Patient Details  ?Name: Alejandro Shelton ?MRN: [id number] ?Date of Birth: November 24, 1985 ? ?Transition of Care (TOC) CM/SW Contact:    ?Jaylean Buenaventura C Tarpley-Carter, LCSWA ?Phone Number: ?08/03/2021, 10:00 AM ? ? ?Clinical Narrative: ?Pt participated in Cage-Aid.  Pt stated he does use substance to RN.  Pt was offered resources, due to usage of substance.    ? ?Insurance underwriter, MSW, LCSW-A ?Pronouns:  She/Her/Hers ?[facility] ?Clinical Social Worker ?Direct Number:  [phone] ?[email or web address] ? ?CAGE-AID Screening: ?  ? ?Have You Ever Felt You Ought to Cut Down on Your Drinking or Drug Use?: Yes ?Have People Annoyed You By Critizing Your Drinking Or Drug Use?: Yes ?Have You Felt Bad Or Guilty About Your Drinking Or Drug Use?: No ?Have You Ever Had a Drink or Used Drugs First Thing In The Morning to Steady Your Nerves or to Get Rid of a Hangover?: Yes ?CAGE-AID Score: 3 ? ?Substance Abuse Education Offered: Yes ? ?  ? ? ? ? ? ? ?

## 2021-08-03 NOTE — Progress Notes (Signed)
? ?Progress Note ? ?   ?Subjective: ?Pt reports he felt more distended and gassy overnight but is feeling a little better this AM. He is passing a little more flatus. NGT still bilious but seems to be working well. He is still distended. He has been ambulating and using chair in room.  ? ?Objective: ?Vital signs in last 24 hours: ?Temp:  [98.1 ?F (36.7 ?C)-98.6 ?F (37 ?C)] 98.5 ?F (36.9 ?C) (04/14 4166) ?Pulse Rate:  [88-98] 91 (04/14 0855) ?Resp:  [16-18] 18 (04/14 0855) ?BP: (118-153)/(84-108) 145/100 (04/14 0855) ?SpO2:  [99 %-100 %] 100 % (04/14 0855) ?Last BM Date : 07/23/21 ? ?Intake/Output from previous day: ?04/13 0701 - 04/14 0700 ?In: 3812.7 [I.V.:3328.3; IV Piggyback:484.4] ?Out: 525 [Emesis/NG output:525] ?Intake/Output this shift: ?No intake/output data recorded. ? ?PE: ?Gen:  Alert, NAD, pleasant ?Card:  Tachycardic with regular rhythm ?Pulm:  CTAB, no W/R/R, effort normal ?Abd: Soft w/ distention. Appropriately TTP. Hypoactive BS. Incisions c/d/I, suture tail trimmed from midline. NGT w/ bilious output, NGT flushes and pulls back easily.  ?Ext:  MAE's ?Psych: A&Ox3  ?Skin: no rashes noted, warm and dry ? ? ?Lab Results:  ?Recent Labs  ?  08/02/21 ?0444 08/03/21 ?0630  ?WBC 8.1 7.6  ?HGB 10.2* 10.0*  ?HCT 31.3* 30.7*  ?PLT 405* 400  ? ? ?BMET ?Recent Labs  ?  08/02/21 ?0444 08/03/21 ?1601  ?NA 132* 131*  ?K 3.4* 3.6  ?CL 100 99  ?CO2 26 27  ?GLUCOSE 114* 119*  ?BUN 15 11  ?CREATININE 0.88 0.62  ?CALCIUM 8.5* 8.4*  ? ? ?PT/INR ?No results for input(s): LABPROT, INR in the last 72 hours. ?CMP  ?   ?Component Value Date/Time  ? NA 131 (L) 08/03/2021 0417  ? NA 138 02/09/2021 1036  ? K 3.6 08/03/2021 0417  ? CL 99 08/03/2021 0417  ? CO2 27 08/03/2021 0417  ? GLUCOSE 119 (H) 08/03/2021 0417  ? BUN 11 08/03/2021 0417  ? BUN 11 02/09/2021 1036  ? CREATININE 0.62 08/03/2021 0417  ? CALCIUM 8.4 (L) 08/03/2021 0417  ? PROT 6.3 (L) 08/02/2021 0444  ? ALBUMIN 3.0 (L) 08/02/2021 0444  ? AST 15 08/02/2021 0444  ?  ALT 22 08/02/2021 0444  ? ALKPHOS 46 08/02/2021 0444  ? BILITOT 0.3 08/02/2021 0444  ? GFRNONAA >60 08/03/2021 0417  ? GFRAA >90 12/12/2012 0526  ? ?Lipase  ?   ?Component Value Date/Time  ? LIPASE 48 07/30/2021 1358  ? ? ? ? ? ?Studies/Results: ?DG CHEST PORT 1 VIEW ? ?Result Date: 08/02/2021 ?CLINICAL DATA:  Shortness of breath, NG tube placement EXAM: PORTABLE CHEST 1 VIEW COMPARISON:  08/01/2021 FINDINGS: Nasogastric tube with the tip projecting over the stomach. Right-sided PICC line with the tip projecting over the SVC. No focal consolidation. No pleural effusion or pneumothorax. Heart and mediastinal contours are unremarkable. No acute osseous abnormality. IMPRESSION: 1. Nasogastric tube with the tip projecting over the stomach. 2. Right-sided PICC line with the tip projecting over the SVC. Electronically Signed   By: Elige Ko M.D.   On: 08/02/2021 08:43  ? ?DG Abd Portable 1V ? ?Result Date: 08/02/2021 ?CLINICAL DATA:  NG tube placement EXAM: PORTABLE ABDOMEN - 1 VIEW COMPARISON:  None. FINDINGS: NG tube tip is at the thoracic inlet, likely within the proximal esophagus. Right arm PICC with tip in the right atrium. Recommend advancement. Multiple dilated loops of bowel, compatible with small-bowel obstruction. Cardiac and mediastinal contours are within normal limits. Lungs are clear.  No acute osseous abnormality. IMPRESSION: 1. NG tube tip is at the thoracic inlet, likely within the proximal esophagus. Recommend advancement. 2. Right arm PICC with tip in the right atrium. These results will be called to the ordering clinician or representative by the Radiologist Assistant, and communication documented in the PACS or [employer]. Electronically Signed   By: Allegra Lai M.D.   On: 08/02/2021 07:56   ? ?Anti-infectives: ?Anti-infectives (From admission, onward)  ? ? None  ? ?  ? ? ? ?Assessment/Plan ?POD 24 s/p colostomy takedown w/ 30 min LOA by Dr. Janee Morn on 07/10/21.  ?SBO vs Ileus  ?- CT A/P  high-grade small bowel obstruction w/ diameter of the small intestine up to 6.5 cm. No free air.  ?- Question ileus vs early post op SBO. Continue conservative management. No indication for emergency surgery. ?- Cont NGT on LIWS - NGT with bilious output ?- Keep K > 4 and Mg > 2 and mobilize for bowel function ?- continue TPN (checking pre-alb, albumin 4.0)  ?- continue decompression for now - may be able to start clamping some over the weekend if bowel function continues to increase and NGT output trending down ?- mobilize as tolerated to help with bowel motility ?Tachy/HTN - Scheduled IV lopressor 7.5 mg q6h ?Hyponatremia - 131 this AM, continue to monitor  ?  ?FEN - NPO, NGT, TPN ?VTE - SCDs, Lovenox ?ID - None  ?Foley - None, monitor I/O, voiding ? ?Dispo - Med-surg, await bowel function.  ? LOS: 4 days  ? ? ?Juliet Rude, PA-C ?[facility] ?08/03/2021, 9:40 AM ?Please see Amion for pager number during day hours 7:00am-4:30pm ? ?

## 2021-08-03 NOTE — Plan of Care (Signed)
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
## 2021-08-03 NOTE — Progress Notes (Signed)
Patient refused to put his tele on, CBG check, and vital signs for NT, Haley, so I was not able to administer his insulin and IV metoprolol.  ?Around 1600 I went into patient's room. He was in pain and stated that I did not care that he was in pain. He also demanded to talk with the charge nurse but when I told him it was Windell Moulding (who he referred to as "as the pregnant one") he said he demanded to talk with his doctor so I paged the Trauma team and Dr. Janee Morn came to see him. Later I went into his room and he complained that no one cared about him and the pain he was in. He said a lot of other negative comments to me.  ? ? ?

## 2021-08-03 NOTE — Progress Notes (Signed)
Patient ID: Alejandro Shelton, male   DOB: February 18, 1986, 36 y.o.   MRN: [id number] ?CTS patient as he is frustrated. He was up in the BR and has passed more gas. Having a lot of cramps and back pain. Add scheduled Tylenol. I reassured him he is actually making progress. I also spoke with his RN. ? ?Violeta Gelinas, MD, MPH, FACS ?Please use [email or web address] to contact on call provider ? ?

## 2021-08-04 LAB — CBC
HCT: 30 % — ABNORMAL LOW (ref 39.0–52.0)
Hemoglobin: 10.2 g/dL — ABNORMAL LOW (ref 13.0–17.0)
MCH: 28.3 pg (ref 26.0–34.0)
MCHC: 34 g/dL (ref 30.0–36.0)
MCV: 83.3 fL (ref 80.0–100.0)
Platelets: 352 10*3/uL (ref 150–400)
RBC: 3.6 MIL/uL — ABNORMAL LOW (ref 4.22–5.81)
RDW: 14.2 % (ref 11.5–15.5)
WBC: 8.8 10*3/uL (ref 4.0–10.5)
nRBC: 0 % (ref 0.0–0.2)

## 2021-08-04 LAB — BASIC METABOLIC PANEL
Anion gap: 7 (ref 5–15)
BUN: 15 mg/dL (ref 6–20)
CO2: 25 mmol/L (ref 22–32)
Calcium: 8.7 mg/dL — ABNORMAL LOW (ref 8.9–10.3)
Chloride: 101 mmol/L (ref 98–111)
Creatinine, Ser: 0.65 mg/dL (ref 0.61–1.24)
GFR, Estimated: >60 mL/min (ref >60)
Glucose, Bld: 109 mg/dL — ABNORMAL HIGH (ref 70–99)
Potassium: 3.9 mmol/L (ref 3.5–5.1)
Sodium: 133 mmol/L — ABNORMAL LOW (ref 135–145)

## 2021-08-04 LAB — PHOSPHORUS: Phosphorus: 4 mg/dL (ref 2.5–4.6)

## 2021-08-04 LAB — GLUCOSE, CAPILLARY
Glucose-Capillary: 103 mg/dL — ABNORMAL HIGH (ref 70–99)
Glucose-Capillary: 113 mg/dL — ABNORMAL HIGH (ref 70–99)
Glucose-Capillary: 113 mg/dL — ABNORMAL HIGH (ref 70–99)
Glucose-Capillary: 98 mg/dL (ref 70–99)

## 2021-08-04 LAB — MAGNESIUM: Magnesium: 1.9 mg/dL (ref 1.7–2.4)

## 2021-08-04 MED ORDER — ALUM & MAG HYDROXIDE-SIMETH 200-200-20 MG/5ML PO SUSP
30.0000 mL | Freq: Four times a day (QID) | ORAL | Status: DC | PRN
  Administered 2021-08-04 – 2021-08-25 (×8): 30 mL
  Filled 2021-08-04 (×9): qty 30

## 2021-08-04 MED ORDER — TRAVASOL 10 % IV SOLN
INTRAVENOUS | Status: AC
  Filled 2021-08-04: qty 1248

## 2021-08-04 MED ORDER — MAGNESIUM SULFATE 2 GM/50ML IV SOLN
2.0000 g | Freq: Once | INTRAVENOUS | Status: AC
  Administered 2021-08-04: 2 g via INTRAVENOUS
  Filled 2021-08-04: qty 50

## 2021-08-04 MED ORDER — POTASSIUM CHLORIDE 10 MEQ/50ML IV SOLN
10.0000 meq | Freq: Once | INTRAVENOUS | Status: AC
  Administered 2021-08-04: 10 meq via INTRAVENOUS
  Filled 2021-08-04: qty 50

## 2021-08-04 NOTE — Progress Notes (Signed)
? ?Progress Note ? ?   ?Subjective: ?Passing gas yesterday and this morning but still with some burping and feeling bloated. No BM. Having abdominal pain - stable but requiring pain medications. NGT still bilious but seems to be working well. ?NGT output 900 ml/24H ? ?Objective: ?Vital signs in last 24 hours: ?Temp:  [98 ?F (36.7 ?C)-98.7 ?F (37.1 ?C)] 98.7 ?F (37.1 ?C) (04/15 6283) ?Pulse Rate:  [91-107] 107 (04/15 0523) ?Resp:  [16-18] 18 (04/15 0523) ?BP: (125-145)/(100-110) 141/105 (04/15 0523) ?SpO2:  [100 %] 100 % (04/15 0523) ?Last BM Date : 07/23/21 ? ?Intake/Output from previous day: ?04/14 0701 - 04/15 0700 ?In: 1694.4 [I.V.:1295.8; IV Piggyback:398.6] ?Out: 900 [Emesis/NG output:900] ?Intake/Output this shift: ?No intake/output data recorded. ? ?PE: ?Gen:  Alert, NAD, pleasant ?Card:  Tachycardic with regular rhythm ?Pulm: effort normal ?Abd: Soft w/ distention. Appropriately TTP. Hypoactive BS. Incisions c/d/I. NGT w/ bilious output - not connected to suction currently as patient was recently OOB to bathroom  ?Ext:  MAE's ?Psych: A&Ox3  ?Skin: no rashes noted, warm and dry ? ? ?Lab Results:  ?Recent Labs  ?  08/03/21 ?6629 08/04/21 ?0357  ?WBC 7.6 8.8  ?HGB 10.0* 10.2*  ?HCT 30.7* 30.0*  ?PLT 400 352  ? ? ?BMET ?Recent Labs  ?  08/03/21 ?4765 08/04/21 ?0357  ?NA 131* 133*  ?K 3.6 3.9  ?CL 99 101  ?CO2 27 25  ?GLUCOSE 119* 109*  ?BUN 11 15  ?CREATININE 0.62 0.65  ?CALCIUM 8.4* 8.7*  ? ? ?PT/INR ?No results for input(s): LABPROT, INR in the last 72 hours. ?CMP  ?   ?Component Value Date/Time  ? NA 133 (L) 08/04/2021 0357  ? NA 138 02/09/2021 1036  ? K 3.9 08/04/2021 0357  ? CL 101 08/04/2021 0357  ? CO2 25 08/04/2021 0357  ? GLUCOSE 109 (H) 08/04/2021 0357  ? BUN 15 08/04/2021 0357  ? BUN 11 02/09/2021 1036  ? CREATININE 0.65 08/04/2021 0357  ? CALCIUM 8.7 (L) 08/04/2021 0357  ? PROT 6.3 (L) 08/02/2021 0444  ? ALBUMIN 3.0 (L) 08/02/2021 0444  ? AST 15 08/02/2021 0444  ? ALT 22 08/02/2021 0444  ? ALKPHOS  46 08/02/2021 0444  ? BILITOT 0.3 08/02/2021 0444  ? GFRNONAA >60 08/04/2021 0357  ? GFRAA >90 12/12/2012 0526  ? ?Lipase  ?   ?Component Value Date/Time  ? LIPASE 48 07/30/2021 1358  ? ? ? ? ? ?Studies/Results: ?DG CHEST PORT 1 VIEW ? ?Result Date: 08/02/2021 ?CLINICAL DATA:  Shortness of breath, NG tube placement EXAM: PORTABLE CHEST 1 VIEW COMPARISON:  08/01/2021 FINDINGS: Nasogastric tube with the tip projecting over the stomach. Right-sided PICC line with the tip projecting over the SVC. No focal consolidation. No pleural effusion or pneumothorax. Heart and mediastinal contours are unremarkable. No acute osseous abnormality. IMPRESSION: 1. Nasogastric tube with the tip projecting over the stomach. 2. Right-sided PICC line with the tip projecting over the SVC. Electronically Signed   By: Elige Ko M.D.   On: 08/02/2021 08:43   ? ?Anti-infectives: ?Anti-infectives (From admission, onward)  ? ? None  ? ?  ? ? ? ?Assessment/Plan ?POD 25 s/p colostomy takedown w/ 30 min LOA by Dr. Janee Morn on 07/10/21.  ?SBO vs Ileus  ?- CT A/P high-grade small bowel obstruction w/ diameter of the small intestine up to 6.5 cm. No free air.  ?- Question ileus vs early post op SBO. Continue conservative management. No indication for emergency surgery. ?- Cont NGT on LIWS -  NGT with bilious output ?- Keep K > 4 and Mg > 2 and mobilize for bowel function ?- continue TPN (pre-alb 17.8, albumin 4.0)  ?- continue decompression today, +flatus but still distended and 900 ml NGT output ?- mobilize as tolerated to help with bowel motility ?Tachy/HTN - Scheduled IV lopressor 7.5 mg q6h ?Hyponatremia - low 100s this AM, continue to monitor  ?  ?FEN - NPO, NGT LIWS, TPN ?VTE - SCDs, Lovenox ?ID - None  ?Foley - None, monitor I/O, voiding ? ?Dispo - Med-surg, await bowel function.  ? ? LOS: 5 days  ? ? ?Eric Form, PA-C ?[facility] ?08/04/2021, 7:52 AM ?Please see Amion for pager number during day hours 7:00am-4:30pm ? ?
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
## 2021-08-05 ENCOUNTER — Inpatient Hospital Stay (HOSPITAL_COMMUNITY): Payer: Commercial Managed Care - HMO

## 2021-08-05 LAB — CBC
HCT: 31.4 % — ABNORMAL LOW (ref 39.0–52.0)
Hemoglobin: 10.3 g/dL — ABNORMAL LOW (ref 13.0–17.0)
MCH: 27.6 pg (ref 26.0–34.0)
MCHC: 32.8 g/dL (ref 30.0–36.0)
MCV: 84.2 fL (ref 80.0–100.0)
Platelets: 405 10*3/uL — ABNORMAL HIGH (ref 150–400)
RBC: 3.73 MIL/uL — ABNORMAL LOW (ref 4.22–5.81)
RDW: 14.3 % (ref 11.5–15.5)
WBC: 10.3 10*3/uL (ref 4.0–10.5)
nRBC: 0 % (ref 0.0–0.2)

## 2021-08-05 LAB — BASIC METABOLIC PANEL
Anion gap: 7 (ref 5–15)
BUN: 16 mg/dL (ref 6–20)
CO2: 26 mmol/L (ref 22–32)
Calcium: 8.7 mg/dL — ABNORMAL LOW (ref 8.9–10.3)
Chloride: 99 mmol/L (ref 98–111)
Creatinine, Ser: 0.61 mg/dL (ref 0.61–1.24)
GFR, Estimated: >60 mL/min (ref >60)
Glucose, Bld: 112 mg/dL — ABNORMAL HIGH (ref 70–99)
Potassium: 4.2 mmol/L (ref 3.5–5.1)
Sodium: 132 mmol/L — ABNORMAL LOW (ref 135–145)

## 2021-08-05 LAB — MAGNESIUM: Magnesium: 2.1 mg/dL (ref 1.7–2.4)

## 2021-08-05 LAB — PHOSPHORUS: Phosphorus: 3.8 mg/dL (ref 2.5–4.6)

## 2021-08-05 LAB — GLUCOSE, CAPILLARY
Glucose-Capillary: 110 mg/dL — ABNORMAL HIGH (ref 70–99)
Glucose-Capillary: 118 mg/dL — ABNORMAL HIGH (ref 70–99)
Glucose-Capillary: 102 mg/dL — ABNORMAL HIGH (ref 70–99)
Glucose-Capillary: 108 mg/dL — ABNORMAL HIGH (ref 70–99)

## 2021-08-05 MED ORDER — TRAVASOL 10 % IV SOLN
INTRAVENOUS | Status: AC
  Filled 2021-08-05: qty 1248
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
## 2021-08-05 NOTE — Progress Notes (Signed)
? ?Progress Note ? ?   ?Subjective: ?Still passing flatus but bloating is not improved and having significant abdominal pain. Has been out of bed but not ambulating in hallway. No BM. Having some nausea and sensation of regurgitation but no emesis ? ?Objective: ?Vital signs in last 24 hours: ?Temp:  [98.3 ?F (36.8 ?C)-98.7 ?F (37.1 ?C)] 98.3 ?F (36.8 ?C) (04/16 0910) ?Pulse Rate:  [89-102] 93 (04/16 0910) ?Resp:  [18] 18 (04/16 0910) ?BP: (135-140)/(103-113) 135/113 (04/16 0910) ?SpO2:  [100 %] 100 % (04/16 0910) ?Last BM Date : 07/23/21 ? ?Intake/Output from previous day: ?04/15 0701 - 04/16 0700 ?In: 4620.4 [I.V.:3870.4; IV Piggyback:750] ?Out: 1750 [Urine:650; Emesis/NG output:1100] ?Intake/Output this shift: ?Total I/O ?In: 243.3 [I.V.:223.3; NG/GT:20] ?Out: 700 [Urine:700] ? ?PE: ?Gen:  Alert, NAD, pleasant ?Card:  Tachycardic with regular rhythm ?Pulm: effort normal ?Abd: Soft w/ moderate distention. Appropriately TTP. +BS. Incisions c/d/I. NGT w/ bilious output ?Ext:  MAE's ?Psych: A&Ox3  ?Skin: no rashes noted, warm and dry ? ? ?Lab Results:  ?Recent Labs  ?  08/04/21 ?0357 08/05/21 ?0175  ?WBC 8.8 10.3  ?HGB 10.2* 10.3*  ?HCT 30.0* 31.4*  ?PLT 352 405*  ? ? ?BMET ?Recent Labs  ?  08/04/21 ?0357 08/05/21 ?1025  ?NA 133* 132*  ?K 3.9 4.2  ?CL 101 99  ?CO2 25 26  ?GLUCOSE 109* 112*  ?BUN 15 16  ?CREATININE 0.65 0.61  ?CALCIUM 8.7* 8.7*  ? ? ?PT/INR ?No results for input(s): LABPROT, INR in the last 72 hours. ?CMP  ?   ?Component Value Date/Time  ? NA 132 (L) 08/05/2021 0317  ? NA 138 02/09/2021 1036  ? K 4.2 08/05/2021 0317  ? CL 99 08/05/2021 0317  ? CO2 26 08/05/2021 0317  ? GLUCOSE 112 (H) 08/05/2021 0317  ? BUN 16 08/05/2021 0317  ? BUN 11 02/09/2021 1036  ? CREATININE 0.61 08/05/2021 0317  ? CALCIUM 8.7 (L) 08/05/2021 0317  ? PROT 6.3 (L) 08/02/2021 0444  ? ALBUMIN 3.0 (L) 08/02/2021 0444  ? AST 15 08/02/2021 0444  ? ALT 22 08/02/2021 0444  ? ALKPHOS 46 08/02/2021 0444  ? BILITOT 0.3 08/02/2021 0444  ?  GFRNONAA >60 08/05/2021 0317  ? GFRAA >90 12/12/2012 0526  ? ?Lipase  ?   ?Component Value Date/Time  ? LIPASE 48 07/30/2021 1358  ? ? ? ? ? ?Studies/Results: ?No results found. ? ?Anti-infectives: ?Anti-infectives (From admission, onward)  ? ? None  ? ?  ? ? ? ?Assessment/Plan ?POD 26 s/p colostomy takedown w/ 30 min LOA by Dr. Janee Morn on 07/10/21.  ?SBO vs Ileus  ?- CT A/P high-grade small bowel obstruction w/ diameter of the small intestine up to 6.5 cm. No free air.  ?- Question ileus vs early post op SBO. Continue conservative management. No indication for emergency surgery. ?- Cont NGT on LIWS - NGT with bilious output, >1L over last 24H ?- afebrile, WBC normal. abd xray this am pending ?- Keep K > 4 and Mg > 2 and mobilize for bowel function ?- continue TPN (pre-alb 17.8, albumin 4.0)  ?- mobilize as tolerated to help with bowel motility ?Tachy/HTN - Scheduled IV lopressor 7.5 mg q6h ?Hyponatremia - 132 this am ?  ?FEN - NPO, NGT LIWS, TPN ?VTE - SCDs, Lovenox ?ID - None  ?Foley - None, monitor I/O, voiding ? ?Dispo - Med-surg, await bowel function.  ? ? LOS: 6 days  ? ? ?Eric Form, PA-C ?[facility] ?08/05/2021, 9:21 AM ?Please  see Amion for pager number during day hours 7:00am-4:30pm ? ?

## 2021-08-05 NOTE — Progress Notes (Signed)
Per MD, NGT advanced 5cm and re-secured. Output increase noted immediately.  ?

## 2021-08-06 LAB — COMPREHENSIVE METABOLIC PANEL
ALT: 30 U/L (ref 0–44)
AST: 19 U/L (ref 15–41)
Albumin: 3.1 g/dL — ABNORMAL LOW (ref 3.5–5.0)
Alkaline Phosphatase: 51 U/L (ref 38–126)
Anion gap: 7 (ref 5–15)
BUN: 15 mg/dL (ref 6–20)
CO2: 27 mmol/L (ref 22–32)
Calcium: 8.9 mg/dL (ref 8.9–10.3)
Chloride: 100 mmol/L (ref 98–111)
Creatinine, Ser: 0.68 mg/dL (ref 0.61–1.24)
GFR, Estimated: >60 mL/min (ref >60)
Glucose, Bld: 108 mg/dL — ABNORMAL HIGH (ref 70–99)
Potassium: 4.4 mmol/L (ref 3.5–5.1)
Sodium: 134 mmol/L — ABNORMAL LOW (ref 135–145)
Total Bilirubin: 0.1 mg/dL — ABNORMAL LOW (ref 0.3–1.2)
Total Protein: 6.6 g/dL (ref 6.5–8.1)

## 2021-08-06 LAB — GLUCOSE, CAPILLARY
Glucose-Capillary: 114 mg/dL — ABNORMAL HIGH (ref 70–99)
Glucose-Capillary: 117 mg/dL — ABNORMAL HIGH (ref 70–99)

## 2021-08-06 LAB — PHOSPHORUS: Phosphorus: 4.4 mg/dL (ref 2.5–4.6)

## 2021-08-06 LAB — MAGNESIUM: Magnesium: 1.9 mg/dL (ref 1.7–2.4)

## 2021-08-06 LAB — TRIGLYCERIDES: Triglycerides: 68 mg/dL (ref <150)

## 2021-08-06 MED ORDER — TRAVASOL 10 % IV SOLN
INTRAVENOUS | Status: AC
  Filled 2021-08-06: qty 1248

## 2021-08-06 MED ORDER — ACETAMINOPHEN 10 MG/ML IV SOLN
1000.0000 mg | Freq: Four times a day (QID) | INTRAVENOUS | Status: AC
  Administered 2021-08-06 – 2021-08-07 (×3): 1000 mg via INTRAVENOUS
  Filled 2021-08-06 (×3): qty 100

## 2021-08-06 NOTE — Plan of Care (Signed)
  Problem: Activity: Goal: Risk for activity intolerance will decrease Outcome: Progressing   Problem: Pain Managment: Goal: General experience of comfort will improve Outcome: Progressing   
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
## 2021-08-06 NOTE — Progress Notes (Signed)
? ? ?   ?Subjective: ?CC: ?Feels better compared to yesterday - less bloated after NGT was adjusted w/ 1.25L out in the last 24 hours. 1 episode of flatus yesterday. None this am. No BM. Stable abdominal pain and nausea. Did not ambulate in halls yesterday. Voiding.  ? ?Objective: ?Vital signs in last 24 hours: ?Temp:  [97.7 ?F (36.5 ?C)-98.5 ?F (36.9 ?C)] 98.3 ?F (36.8 ?C) (04/17 0736) ?Pulse Rate:  [84-109] 89 (04/17 0736) ?Resp:  [16-20] 16 (04/17 0736) ?BP: (107-150)/(79-113) 107/79 (04/17 0736) ?SpO2:  [100 %] 100 % (04/17 0736) ?Last BM Date : 07/23/21 ? ?Intake/Output from previous day: ?04/16 0701 - 04/17 0700 ?In: 2803.8 [I.V.:2783.8; NG/GT:20] ?Out: 2900 [Urine:1650; Emesis/NG output:1250] ?Intake/Output this shift: ?No intake/output data recorded. ? ?PE: ?Gen:  Alert, NAD, pleasant ?Card:  RRR ?Pulm: CTA b/l. Normal rate and effort  ?Abd: Soft w/ moderate distention. Appropriately TTP. +BS. Midline wound healed. NGT w/ bilious output ?Ext:  MAE's ?Psych: A&Ox3  ?Skin: no rashes noted, warm and dry ?  ? ?Lab Results:  ?Recent Labs  ?  08/04/21 ?0357 08/05/21 ?5621  ?WBC 8.8 10.3  ?HGB 10.2* 10.3*  ?HCT 30.0* 31.4*  ?PLT 352 405*  ? ?BMET ?Recent Labs  ?  08/05/21 ?3086 08/06/21 ?0425  ?NA 132* 134*  ?K 4.2 4.4  ?CL 99 100  ?CO2 26 27  ?GLUCOSE 112* 108*  ?BUN 16 15  ?CREATININE 0.61 0.68  ?CALCIUM 8.7* 8.9  ? ?PT/INR ?No results for input(s): LABPROT, INR in the last 72 hours. ?CMP  ?   ?Component Value Date/Time  ? NA 134 (L) 08/06/2021 0425  ? NA 138 02/09/2021 1036  ? K 4.4 08/06/2021 0425  ? CL 100 08/06/2021 0425  ? CO2 27 08/06/2021 0425  ? GLUCOSE 108 (H) 08/06/2021 0425  ? BUN 15 08/06/2021 0425  ? BUN 11 02/09/2021 1036  ? CREATININE 0.68 08/06/2021 0425  ? CALCIUM 8.9 08/06/2021 0425  ? PROT 6.6 08/06/2021 0425  ? ALBUMIN 3.1 (L) 08/06/2021 0425  ? AST 19 08/06/2021 0425  ? ALT 30 08/06/2021 0425  ? ALKPHOS 51 08/06/2021 0425  ? BILITOT 0.1 (L) 08/06/2021 0425  ? GFRNONAA >60 08/06/2021 0425  ?  GFRAA >90 12/12/2012 0526  ? ?Lipase  ?   ?Component Value Date/Time  ? LIPASE 48 07/30/2021 1358  ? ? ?Studies/Results: ?DG Abd Portable 1V ? ?Result Date: 08/05/2021 ?CLINICAL DATA:  Bowel obstruction. EXAM: PORTABLE ABDOMEN - 1 VIEW COMPARISON:  One-view abdomen 08/02/2021 FINDINGS: Multiple dilated loops of small bowel are again noted. No definite free air is present. NG tube terminates at the GE junction. IMPRESSION: 1. Persistent small bowel obstruction. 2. NG tube terminates at the GE junction. This needs to be advanced several cm for the side port to be in the stomach. These results will be called to the ordering clinician or representative by the Radiologist Assistant, and communication documented in the PACS or [employer]. Electronically Signed   By: Marin Roberts M.D.   On: 08/05/2021 12:09   ? ?Anti-infectives: ?Anti-infectives (From admission, onward)  ? ? None  ? ?  ? ? ? ?Assessment/Plan ?POD 27 s/p colostomy takedown w/ 30 min LOA by Dr. Janee Morn on 07/10/21.  ?SBO vs Ileus  ?- CT A/P 4/10 w/ high-grade small bowel obstruction w/ diameter of the small intestine up to 6.5 cm. No free air.  ?- Suspect post op SBO. Continue conservative management. No indication for emergency surgery. Usually try to wait  these out till ~6 weeks post op to see if they improve on their own.  ?- Cont NGT on LIWS - NGT with bilious output, >1L over last 24H ?- Afebrile, WBC normal 4/16 ?- Keep K > 4 and Mg > 2 and mobilize for bowel function ?- continue TPN (pre-alb 17.8, albumin 4.0)  ?- mobilize as tolerated to help with bowel motility ?Tachy/HTN - Scheduled IV lopressor 7.5 mg q6h, improved ?Hyponatremia - 134 this am ?FEN - NPO, NGT LIWS, TPN ?VTE - SCDs, Lovenox ?ID - None  ?Foley - None, monitor I/O, voiding ?Dispo - Med-surg, await bowel function.  ? ? LOS: 7 days  ? ? ?Jacinto Halim , PA-C ?[facility] ?08/06/2021, 8:08 AM ?Please see Amion for pager number during day hours  7:00am-4:30pm ? ?

## 2021-08-06 NOTE — Progress Notes (Signed)
Mobility Specialist Progress Note  ? ? 08/06/21 1142  ?Mobility  ?Activity Ambulated with assistance in hallway  ?Level of Assistance Contact guard assist, steadying assist  ?Assistive Device  ?(hallway railings, HHA)  ?Distance Ambulated (ft) 1100 ft  ?Activity Response Tolerated well  ?$Mobility charge 1 Mobility  ? ?Pt received in bed and agreeable. On second lap, pt started to hold abdomin and grimace in pain. Pt slightly stumbled towards wall but recovered w/o help. Returned to sitting EOB with call bell in reach.  ? ?Hildred Alamin ?Mobility Specialist  ?Primary: 5N M.S. Phone: [phone] ?Secondary: [address] M.S. Phone: [phone] ?  ?

## 2021-08-07 ENCOUNTER — Inpatient Hospital Stay (HOSPITAL_COMMUNITY): Payer: Commercial Managed Care - HMO

## 2021-08-07 LAB — BASIC METABOLIC PANEL
Anion gap: 9 (ref 5–15)
BUN: 15 mg/dL (ref 6–20)
CO2: 27 mmol/L (ref 22–32)
Calcium: 8.6 mg/dL — ABNORMAL LOW (ref 8.9–10.3)
Chloride: 96 mmol/L — ABNORMAL LOW (ref 98–111)
Creatinine, Ser: 0.72 mg/dL (ref 0.61–1.24)
GFR, Estimated: >60 mL/min (ref >60)
Glucose, Bld: 113 mg/dL — ABNORMAL HIGH (ref 70–99)
Potassium: 4.2 mmol/L (ref 3.5–5.1)
Sodium: 132 mmol/L — ABNORMAL LOW (ref 135–145)

## 2021-08-07 LAB — PHOSPHORUS: Phosphorus: 4.5 mg/dL (ref 2.5–4.6)

## 2021-08-07 LAB — MAGNESIUM: Magnesium: 2 mg/dL (ref 1.7–2.4)

## 2021-08-07 MED ORDER — TRAVASOL 10 % IV SOLN
INTRAVENOUS | Status: AC
  Filled 2021-08-07: qty 1248

## 2021-08-07 MED ORDER — ACETAMINOPHEN 10 MG/ML IV SOLN
1000.0000 mg | Freq: Four times a day (QID) | INTRAVENOUS | Status: AC
  Administered 2021-08-07 – 2021-08-08 (×4): 1000 mg via INTRAVENOUS
  Filled 2021-08-07 (×4): qty 100

## 2021-08-07 NOTE — Progress Notes (Signed)
Abd imaging resulted, recs. To advance NGT 5-10 cm, PA notified, good return of secretions noted, per PA no need for repeat Abd imaging. ?

## 2021-08-07 NOTE — Plan of Care (Signed)
Pt bowel sounds active abdomen less distended. Pt Denies having gas and last bm 4/3. See previous notes regarding NG tube. Pt continues to need dilaudid every 2 hours  ?Problem: Elimination: ?Goal: Will not experience complications related to bowel motility ?Outcome: Not Progressing ?  ?Problem: Education: ?Goal: Knowledge of General Education information will improve ?Description: Including pain rating scale, medication(s)/side effects and non-pharmacologic comfort measures ?Outcome: Progressing ?  ?Problem: Health Behavior/Discharge Planning: ?Goal: Ability to manage health-related needs will improve ?Outcome: Progressing ?  ?Problem: Clinical Measurements: ?Goal: Ability to maintain clinical measurements within normal limits will improve ?Outcome: Progressing ?Goal: Will remain free from infection ?Outcome: Progressing ?Goal: Diagnostic test results will improve ?Outcome: Progressing ?Goal: Respiratory complications will improve ?Outcome: Progressing ?Goal: Cardiovascular complication will be avoided ?Outcome: Progressing ?  ?Problem: Activity: ?Goal: Risk for activity intolerance will decrease ?Outcome: Progressing ?  ?Problem: Nutrition: ?Goal: Adequate nutrition will be maintained ?Outcome: Progressing ?  ?Problem: Coping: ?Goal: Level of anxiety will decrease ?Outcome: Progressing ?  ?Problem: Elimination: ?Goal: Will not experience complications related to urinary retention ?Outcome: Progressing ?  ?Problem: Pain Managment: ?Goal: General experience of comfort will improve ?Outcome: Progressing ?  ?Problem: Safety: ?Goal: Ability to remain free from injury will improve ?Outcome: Progressing ?  ?Problem: Skin Integrity: ?Goal: Risk for impaired skin integrity will decrease ?Outcome: Progressing ?  ?
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
## 2021-08-07 NOTE — Progress Notes (Signed)
Mobility Specialist Progress Note: ? ? 08/07/21 1438  ?Mobility  ?Activity Ambulated with assistance in hallway  ?Level of Assistance Modified independent, requires aide device or extra time  ?Assistive Device None  ?Distance Ambulated (ft) 1100 ft  ?Activity Response Tolerated well  ?$Mobility charge 1 Mobility  ? ?Pt received in bed willing to participate in mobility. Complaints of 4/10 pain. Left in bathroom with all needs met.  ? ?Alejandro Shelton ?Mobility Specialist ?Primary Phone [phone] ? ?

## 2021-08-07 NOTE — Progress Notes (Addendum)
Pt called out his NG tube was making noises. Looked and tube and came displaced from original location. The clamp was on the tube. Tube was clamped to shirt as well. Tube advanced. Trauma on call contacted for xray order to confirm. Xray obtained an awaiting for news on correct placement. Pt stated during overnight this tube is coming out today. When entering room pt continuously has fingers on NG.  ?

## 2021-08-07 NOTE — Progress Notes (Signed)
? ? ?   ?Subjective: ?CC: ?Patient reports that NG tube became dislodged overnight.  Had increased abdominal tightness/distention and nausea after this.  RN has advanced tube and xray pending. Some relief after NGT rehooked to LIWS.  Still passing small amounts of flatus but very infrequently.  Voiding without difficulty. ? ?We had a long discussion about his diagnosis of early post operative SBO.  Discussed we are trying to treat this nonoperatively to see if this improves.  Also discussed that we usually continue conservative/nonoperative management for this until ~6 weeks post op before considering surgery and discussed reasons why. Patient very receptive and understanding of this. States he understands this may take time to improve and will be patient. He is hopeful it will improve.  He is going to try to ambulate more in the halls.  ? ?Objective: ?Vital signs in last 24 hours: ?Temp:  [97.5 ?F (36.4 ?C)-98.4 ?F (36.9 ?C)] 97.5 ?F (36.4 ?C) (04/18 0441) ?Pulse Rate:  [87-97] 97 (04/18 0441) ?Resp:  [16-17] 16 (04/18 0441) ?BP: (133-145)/(98-105) 139/100 (04/18 0441) ?SpO2:  [99 %-100 %] 100 % (04/18 0441) ?Last BM Date : 07/23/21 ? ?Intake/Output from previous day: ?04/17 0701 - 04/18 0700 ?In: 481.3 [I.V.:381.3; IV Piggyback:100] ?Out: 1250 [Urine:1100; Emesis/NG output:150] ?Intake/Output this shift: ?No intake/output data recorded. ? ?PE: ?Gen:  Alert, NAD, pleasant ?Card:  RRR ?Pulm: CTA b/l. Normal rate and effort  ?Abd: Soft w/ moderate distention. Appropriately TTP. +BS. Midline wound healed. NGT w/ bilious output ?Ext:  MAE's ?Psych: A&Ox3  ?Skin: no rashes noted, warm and dry ?  ? ?Lab Results:  ?Recent Labs  ?  08/05/21 ?4098  ?WBC 10.3  ?HGB 10.3*  ?HCT 31.4*  ?PLT 405*  ? ?BMET ?Recent Labs  ?  08/06/21 ?0425 08/07/21 ?0454  ?NA 134* 132*  ?K 4.4 4.2  ?CL 100 96*  ?CO2 27 27  ?GLUCOSE 108* 113*  ?BUN 15 15  ?CREATININE 0.68 0.72  ?CALCIUM 8.9 8.6*  ? ?PT/INR ?No results for input(s): LABPROT, INR in  the last 72 hours. ?CMP  ?   ?Component Value Date/Time  ? NA 132 (L) 08/07/2021 0454  ? NA 138 02/09/2021 1036  ? K 4.2 08/07/2021 0454  ? CL 96 (L) 08/07/2021 0454  ? CO2 27 08/07/2021 0454  ? GLUCOSE 113 (H) 08/07/2021 0454  ? BUN 15 08/07/2021 0454  ? BUN 11 02/09/2021 1036  ? CREATININE 0.72 08/07/2021 0454  ? CALCIUM 8.6 (L) 08/07/2021 0454  ? PROT 6.6 08/06/2021 0425  ? ALBUMIN 3.1 (L) 08/06/2021 0425  ? AST 19 08/06/2021 0425  ? ALT 30 08/06/2021 0425  ? ALKPHOS 51 08/06/2021 0425  ? BILITOT 0.1 (L) 08/06/2021 0425  ? GFRNONAA >60 08/07/2021 0454  ? GFRAA >90 12/12/2012 0526  ? ?Lipase  ?   ?Component Value Date/Time  ? LIPASE 48 07/30/2021 1358  ? ? ?Studies/Results: ?DG Abd Portable 1V ? ?Result Date: 08/05/2021 ?CLINICAL DATA:  Bowel obstruction. EXAM: PORTABLE ABDOMEN - 1 VIEW COMPARISON:  One-view abdomen 08/02/2021 FINDINGS: Multiple dilated loops of small bowel are again noted. No definite free air is present. NG tube terminates at the GE junction. IMPRESSION: 1. Persistent small bowel obstruction. 2. NG tube terminates at the GE junction. This needs to be advanced several cm for the side port to be in the stomach. These results will be called to the ordering clinician or representative by the Radiologist Assistant, and communication documented in the PACS or [employer]. Electronically Signed  By: Marin Roberts M.D.   On: 08/05/2021 12:09   ? ?Anti-infectives: ?Anti-infectives (From admission, onward)  ? ? None  ? ?  ? ? ? ?Assessment/Plan ?POD 26 s/p colostomy takedown w/ 30 min LOA by Dr. Janee Morn on 07/10/21.  ?SBO vs Ileus  ?- CT A/P 4/10 w/ high-grade small bowel obstruction w/ diameter of the small intestine up to 6.5 cm. No free air.  ?- Suspect post op SBO. Continue conservative management. No indication for emergency surgery. Usually try to wait these out till ~6 weeks post op to see if they improve on their own.  ?- Cont NGT on LIWS - NGT with bilious output ?- Afebrile, WBC  normal 4/16. Repeat CBC in AM ?- Keep K > 4 and Mg > 2 and mobilize for bowel function ?- Continue TPN (pre-alb 17.8, albumin 4.0)  ?- mobilize as tolerated to help with bowel motility. Okay to clamp NGT for abmulatin.  ?Tachy/HTN - Scheduled IV lopressor 7.5 mg q6h, improved ?Hyponatremia - 132 this am ?FEN - NPO, NGT LIWS, TPN ?VTE - SCDs, Lovenox ?ID - None  ?Foley - None, monitor I/O, voiding ?Dispo - Med-surg, await bowel function.  ? ? LOS: 8 days  ? ? ?Jacinto Halim , PA-C ?[facility] ?08/07/2021, 7:50 AM ?Please see Amion for pager number during day hours 7:00am-4:30pm ? ?

## 2021-08-08 LAB — CBC
HCT: 30.9 % — ABNORMAL LOW (ref 39.0–52.0)
Hemoglobin: 10.1 g/dL — ABNORMAL LOW (ref 13.0–17.0)
MCH: 27.4 pg (ref 26.0–34.0)
MCHC: 32.7 g/dL (ref 30.0–36.0)
MCV: 84 fL (ref 80.0–100.0)
Platelets: 383 10*3/uL (ref 150–400)
RBC: 3.68 MIL/uL — ABNORMAL LOW (ref 4.22–5.81)
RDW: 14.2 % (ref 11.5–15.5)
WBC: 7.8 10*3/uL (ref 4.0–10.5)
nRBC: 0 % (ref 0.0–0.2)

## 2021-08-08 LAB — BASIC METABOLIC PANEL
Anion gap: 6 (ref 5–15)
BUN: 15 mg/dL (ref 6–20)
CO2: 27 mmol/L (ref 22–32)
Calcium: 8.8 mg/dL — ABNORMAL LOW (ref 8.9–10.3)
Chloride: 101 mmol/L (ref 98–111)
Creatinine, Ser: 0.71 mg/dL (ref 0.61–1.24)
GFR, Estimated: >60 mL/min (ref >60)
Glucose, Bld: 111 mg/dL — ABNORMAL HIGH (ref 70–99)
Potassium: 4 mmol/L (ref 3.5–5.1)
Sodium: 134 mmol/L — ABNORMAL LOW (ref 135–145)

## 2021-08-08 LAB — GLUCOSE, CAPILLARY
Glucose-Capillary: 98 mg/dL (ref 70–99)
Glucose-Capillary: 103 mg/dL — ABNORMAL HIGH (ref 70–99)

## 2021-08-08 MED ORDER — TRAVASOL 10 % IV SOLN
INTRAVENOUS | Status: AC
  Filled 2021-08-08: qty 1248

## 2021-08-08 MED ORDER — ACETAMINOPHEN 10 MG/ML IV SOLN
1000.0000 mg | Freq: Four times a day (QID) | INTRAVENOUS | Status: AC
  Administered 2021-08-08 – 2021-08-09 (×4): 1000 mg via INTRAVENOUS
  Filled 2021-08-08 (×3): qty 100

## 2021-08-08 NOTE — Plan of Care (Signed)
  Problem: Activity: Goal: Risk for activity intolerance will decrease Outcome: Progressing   Problem: Coping: Goal: Level of anxiety will decrease Outcome: Progressing   Problem: Pain Managment: Goal: General experience of comfort will improve Outcome: Progressing   Problem: Safety: Goal: Ability to remain free from injury will improve Outcome: Progressing   
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
## 2021-08-08 NOTE — Progress Notes (Signed)
Trauma Event Note ? ? ? ?TRN rounded on patient. Complains of gas pains. NG tube to wall suction with ~500ML output since last charted output approx 1400. Continues to require 2MG  dilaudid q2hrs. Pt stable at this time, VS WDL. No needs at this time.  ? ?Last imported Vital Signs ?BP (!) 136/105   Pulse 97   Temp 98.3 ?F (36.8 ?C) (Oral)   Resp 20   Ht 5\' 11"  (1.803 m)   Wt 150 lb 14.5 oz (68.5 kg)   SpO2 100%   BMI 21.05 kg/m?  ? ?Trending CBC ?Recent Labs  ?  08/05/21 ?[id number]  ?WBC 10.3  ?HGB 10.3*  ?HCT 31.4*  ?PLT 405*  ? ? ?Trending Coag's ?No results for input(s): APTT, INR in the last 72 hours. ? ?Trending BMET ?Recent Labs  ?  08/05/21 ?[id number] 08/06/21 ?0425 08/07/21 ?0454  ?NA 132* 134* 132*  ?K 4.2 4.4 4.2  ?CL 99 100 96*  ?CO2 26 27 27   ?BUN 16 15 15   ?CREATININE 0.61 0.68 0.72  ?GLUCOSE 112* 108* 113*  ? ? ? ? ?[place]  ?Trauma Response RN ? ?Please call TRN at [phone] for further assistance. ? ? ?  ?
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
## 2021-08-08 NOTE — Progress Notes (Signed)
? ? ?   ?Subjective: ?CC: ?Stable abdominal pain and distension. NGT functioning. No flatus or bm. Ambulating in halls.  ? ?Objective: ?Vital signs in last 24 hours: ?Temp:  [97.7 ?F (36.5 ?C)-98.4 ?F (36.9 ?C)] 98.2 ?F (36.8 ?C) (04/19 2482) ?Pulse Rate:  [86-105] 86 (04/19 0833) ?Resp:  [16-20] 16 (04/19 5003) ?BP: (129-156)/(93-106) 137/96 (04/19 7048) ?SpO2:  [99 %-100 %] 100 % (04/19 0833) ?Last BM Date : 07/23/21 ? ?Intake/Output from previous day: ?04/18 0701 - 04/19 0700 ?In: 1585.5 [I.V.:1285.5; IV Piggyback:300] ?Out: 2350 [Urine:1200; Emesis/NG output:1150] ?Intake/Output this shift: ?No intake/output data recorded. ? ?PE: ?Gen:  Alert, NAD, pleasant ?Card:  RRR ?Pulm: CTA b/l. Normal rate and effort  ?Abd: Soft w/ moderate distention. Appropriately TTP. +BS. Midline wound healed. NGT w/ bilious output ?Ext:  MAE's ?Psych: A&Ox3  ?Skin: no rashes noted, warm and dry ? ?Lab Results:  ?Recent Labs  ?  08/08/21 ?0356  ?WBC 7.8  ?HGB 10.1*  ?HCT 30.9*  ?PLT 383  ? ?BMET ?Recent Labs  ?  08/07/21 ?0454 08/08/21 ?0356  ?NA 132* 134*  ?K 4.2 4.0  ?CL 96* 101  ?CO2 27 27  ?GLUCOSE 113* 111*  ?BUN 15 15  ?CREATININE 0.72 0.71  ?CALCIUM 8.6* 8.8*  ? ?PT/INR ?No results for input(s): LABPROT, INR in the last 72 hours. ?CMP  ?   ?Component Value Date/Time  ? NA 134 (L) 08/08/2021 0356  ? NA 138 02/09/2021 1036  ? K 4.0 08/08/2021 0356  ? CL 101 08/08/2021 0356  ? CO2 27 08/08/2021 0356  ? GLUCOSE 111 (H) 08/08/2021 0356  ? BUN 15 08/08/2021 0356  ? BUN 11 02/09/2021 1036  ? CREATININE 0.71 08/08/2021 0356  ? CALCIUM 8.8 (L) 08/08/2021 0356  ? PROT 6.6 08/06/2021 0425  ? ALBUMIN 3.1 (L) 08/06/2021 0425  ? AST 19 08/06/2021 0425  ? ALT 30 08/06/2021 0425  ? ALKPHOS 51 08/06/2021 0425  ? BILITOT 0.1 (L) 08/06/2021 0425  ? GFRNONAA >60 08/08/2021 0356  ? GFRAA >90 12/12/2012 0526  ? ?Lipase  ?   ?Component Value Date/Time  ? LIPASE 48 07/30/2021 1358  ? ? ?Studies/Results: ?DG Abd Portable 1V ? ?Result Date:  08/07/2021 ?CLINICAL DATA:  Evaluate location of enteric tube EXAM: PORTABLE ABDOMEN - 1 VIEW COMPARISON:  08/07/2021 FINDINGS: Dilation of small-bowel loops has not changed significantly. Tip of enteric tube is seen in the fundus of the stomach. Side-port in the enteric tube is in the lower thoracic esophagus close to the gastroesophageal junction. No abnormal masses or calcifications are seen. Kidneys are mostly obscured by bowel contents. IMPRESSION: Dilation of small-bowel loops has not changed significantly. This may suggest ileus or partial obstruction. Tip of enteric tube is noted within the fundus of the stomach. Side-port in the enteric tube is noted in the lower thoracic esophagus. Enteric tube should be advanced 5-10 cm to place the side port within the stomach. Electronically Signed   By: Ernie Avena M.D.   On: 08/07/2021 09:26  ? ?DG Abd Portable 1V ? ?Result Date: 08/07/2021 ?CLINICAL DATA:  Small bowel obstruction. EXAM: PORTABLE ABDOMEN - 1 VIEW COMPARISON:  August 05, 2021. FINDINGS: Distal tip of nasogastric tube is only slightly more distal compared to prior exam and probably only slightly beyond the gastroesophageal junction; advancement is recommended. Continued small bowel dilatation is noted. IMPRESSION: Distal tip of nasogastric tube may be only slightly beyond the gastroesophageal junction; advancement is recommended. Continued small bowel dilatation concerning for  obstruction. Electronically Signed   By: Lupita Raider M.D.   On: 08/07/2021 08:13   ? ?Anti-infectives: ?Anti-infectives (From admission, onward)  ? ? None  ? ?  ? ? ? ?Assessment/Plan ?POD 29 s/p colostomy takedown w/ 30 min LOA by Dr. Janee Morn on 07/10/21.  ?SBO vs Ileus  ?- CT A/P 4/10 w/ high-grade small bowel obstruction w/ diameter of the small intestine up to 6.5 cm. No free air.  ?- Suspect post op SBO. Continue conservative management. No indication for emergency surgery. Usually try to wait these out till ~6 weeks  post op to see if they improve on their own.  ?- Cont NGT on LIWS - NGT with bilious output ?- Afebrile, WBC normal.  ?- Keep K > 4 and Mg > 2 and mobilize for bowel function ?- Continue TPN (pre-alb 17.8, albumin 4.0)  ?- Mobilize as tolerated to help with bowel motility. Okay to clamp NGT for ambulation.  ?Tachy/HTN - Scheduled IV lopressor 7.5 mg q6h, improved ?Hyponatremia - improved, 134 this am ?FEN - NPO, NGT LIWS, TPN ?VTE - SCDs, Lovenox ?ID - None  ?Foley - None, monitor I/O, voiding ?Dispo - Med-surg, await bowel function.  ? ? LOS: 9 days  ? ? ?Jacinto Halim , PA-C ?[facility] ?08/08/2021, 8:36 AM ?Please see Amion for pager number during day hours 7:00am-4:30pm ? ?

## 2021-08-09 LAB — COMPREHENSIVE METABOLIC PANEL
ALT: 25 U/L (ref 0–44)
AST: 18 U/L (ref 15–41)
Albumin: 2.9 g/dL — ABNORMAL LOW (ref 3.5–5.0)
Alkaline Phosphatase: 58 U/L (ref 38–126)
Anion gap: 7 (ref 5–15)
BUN: 15 mg/dL (ref 6–20)
CO2: 25 mmol/L (ref 22–32)
Calcium: 8.7 mg/dL — ABNORMAL LOW (ref 8.9–10.3)
Chloride: 99 mmol/L (ref 98–111)
Creatinine, Ser: 0.59 mg/dL — ABNORMAL LOW (ref 0.61–1.24)
GFR, Estimated: >60 mL/min (ref >60)
Glucose, Bld: 110 mg/dL — ABNORMAL HIGH (ref 70–99)
Potassium: 4 mmol/L (ref 3.5–5.1)
Sodium: 131 mmol/L — ABNORMAL LOW (ref 135–145)
Total Bilirubin: 0.4 mg/dL (ref 0.3–1.2)
Total Protein: 6.5 g/dL (ref 6.5–8.1)

## 2021-08-09 LAB — MAGNESIUM: Magnesium: 1.8 mg/dL (ref 1.7–2.4)

## 2021-08-09 LAB — PHOSPHORUS: Phosphorus: 4.2 mg/dL (ref 2.5–4.6)

## 2021-08-09 MED ORDER — ACETAMINOPHEN 10 MG/ML IV SOLN
1000.0000 mg | Freq: Four times a day (QID) | INTRAVENOUS | Status: AC
  Administered 2021-08-09 – 2021-08-10 (×4): 1000 mg via INTRAVENOUS
  Filled 2021-08-09 (×4): qty 100

## 2021-08-09 MED ORDER — HYDRALAZINE HCL 20 MG/ML IJ SOLN: 10.0000 mg | Freq: Three times a day (TID) | INTRAMUSCULAR | Status: DC | PRN

## 2021-08-09 MED ORDER — TRAVASOL 10 % IV SOLN
INTRAVENOUS | Status: AC
  Filled 2021-08-09: qty 1248

## 2021-08-09 MED ORDER — MAGNESIUM SULFATE 2 GM/50ML IV SOLN
2.0000 g | Freq: Once | INTRAVENOUS | Status: AC
  Administered 2021-08-09: 2 g via INTRAVENOUS
  Filled 2021-08-09: qty 50

## 2021-08-09 NOTE — Progress Notes (Signed)
? ? ?   ?Subjective: ?CC: ?Feels less bloated this am. Abdominal pain improved (used dilaudid x 11 yesterday) No flatus for 2 days. No bm. Walking in halls. 900cc/24 hours from ngt.  ? ?Objective: ?Vital signs in last 24 hours: ?Temp:  [97.8 ?F (36.6 ?C)-98.4 ?F (36.9 ?C)] 98.4 ?F (36.9 ?C) (04/20 4580) ?Pulse Rate:  [85-96] 95 (04/20 0838) ?Resp:  [16-19] 19 (04/20 9983) ?BP: (140-144)/(99-105) 142/101 (04/20 3825) ?SpO2:  [100 %] 100 % (04/20 0838) ?Last BM Date : 07/23/21 ? ?Intake/Output from previous day: ?04/19 0701 - 04/20 0700 ?In: 1749.7 [I.V.:1349.7; IV Piggyback:400] ?Out: 1600 [Urine:700; Emesis/NG output:900] ?Intake/Output this shift: ?No intake/output data recorded. ? ?PE: ?Gen:  Alert, NAD, pleasant ?Card:  RRR ?Pulm: CTA b/l. Normal rate and effort  ?Abd: Soft w/ moderate distention. Appropriately TTP. +BS. Midline wound healed. NGT w/ bilious output ?Ext:  MAE's ?Psych: A&Ox3  ?Skin: no rashes noted, warm and dry ? ?Lab Results:  ?Recent Labs  ?  08/08/21 ?0356  ?WBC 7.8  ?HGB 10.1*  ?HCT 30.9*  ?PLT 383  ? ?BMET ?Recent Labs  ?  08/08/21 ?0356 08/09/21 ?0421  ?NA 134* 131*  ?K 4.0 4.0  ?CL 101 99  ?CO2 27 25  ?GLUCOSE 111* 110*  ?BUN 15 15  ?CREATININE 0.71 0.59*  ?CALCIUM 8.8* 8.7*  ? ?PT/INR ?No results for input(s): LABPROT, INR in the last 72 hours. ?CMP  ?   ?Component Value Date/Time  ? NA 131 (L) 08/09/2021 0421  ? NA 138 02/09/2021 1036  ? K 4.0 08/09/2021 0421  ? CL 99 08/09/2021 0421  ? CO2 25 08/09/2021 0421  ? GLUCOSE 110 (H) 08/09/2021 0421  ? BUN 15 08/09/2021 0421  ? BUN 11 02/09/2021 1036  ? CREATININE 0.59 (L) 08/09/2021 0421  ? CALCIUM 8.7 (L) 08/09/2021 0421  ? PROT 6.5 08/09/2021 0421  ? ALBUMIN 2.9 (L) 08/09/2021 0421  ? AST 18 08/09/2021 0421  ? ALT 25 08/09/2021 0421  ? ALKPHOS 58 08/09/2021 0421  ? BILITOT 0.4 08/09/2021 0421  ? GFRNONAA >60 08/09/2021 0421  ? GFRAA >90 12/12/2012 0526  ? ?Lipase  ?   ?Component Value Date/Time  ? LIPASE 48 07/30/2021 1358   ? ? ?Studies/Results: ?DG Abd Portable 1V ? ?Result Date: 08/07/2021 ?CLINICAL DATA:  Evaluate location of enteric tube EXAM: PORTABLE ABDOMEN - 1 VIEW COMPARISON:  08/07/2021 FINDINGS: Dilation of small-bowel loops has not changed significantly. Tip of enteric tube is seen in the fundus of the stomach. Side-port in the enteric tube is in the lower thoracic esophagus close to the gastroesophageal junction. No abnormal masses or calcifications are seen. Kidneys are mostly obscured by bowel contents. IMPRESSION: Dilation of small-bowel loops has not changed significantly. This may suggest ileus or partial obstruction. Tip of enteric tube is noted within the fundus of the stomach. Side-port in the enteric tube is noted in the lower thoracic esophagus. Enteric tube should be advanced 5-10 cm to place the side port within the stomach. Electronically Signed   By: Ernie Avena M.D.   On: 08/07/2021 09:26   ? ?Anti-infectives: ?Anti-infectives (From admission, onward)  ? ? None  ? ?  ? ? ? ?Assessment/Plan ?POD 30 s/p colostomy takedown w/ 30 min LOA by Dr. Janee Morn on 07/10/21.  ?SBO vs Ileus  ?- CT A/P 4/10 w/ high-grade small bowel obstruction w/ diameter of the small intestine up to 6.5 cm. No free air.  ?- Suspect post op SBO. Continue conservative management.  No indication for emergency surgery. Usually try to wait these out till ~6 weeks post op to see if they improve on their own.  ?- Cont NGT on LIWS - NGT with bilious output ?- Afebrile, WBC normal 4/19 ?- Keep K > 4 and Mg > 2 and mobilize for bowel function ?- Continue TPN (pre-alb 17.8, albumin 2.9)  ?- Mobilize as tolerated to help with bowel motility. Okay to clamp NGT for ambulation.  ?Tachy/HTN - Scheduled IV lopressor 7.5 mg q6h. Add PRN hydralazine.  ?Hyponatremia - 131 this am, recheck in am ?FEN - NPO, NGT LIWS, TPN ?VTE - SCDs, Lovenox ?ID - None  ?Foley - None, monitor I/O, voiding ?Dispo - Med-surg, await bowel function.  ? ? LOS: 10 days   ? ? ?Jacinto Halim , PA-C ?[facility] ?08/09/2021, 8:47 AM ?Please see Amion for pager number during day hours 7:00am-4:30pm ? ?
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
## 2021-08-09 NOTE — Progress Notes (Signed)
Mobility Specialist Progress Note: ? ? 08/09/21 1435  ?Mobility  ?Activity Ambulated with assistance in hallway  ?Level of Assistance Independent after set-up  ?Assistive Device None  ?Distance Ambulated (ft) 300 ft  ?Activity Response Tolerated well  ?$Mobility charge 1 Mobility  ? ?Pt received in bed willing to participate in mobility. Complaints of abdominal pain. Left in bed with call bell in reach and all needs met.  ? ?[place] ?Mobility Specialist ?Primary Phone [phone] ? ?

## 2021-08-10 LAB — BASIC METABOLIC PANEL
Anion gap: 6 (ref 5–15)
BUN: 14 mg/dL (ref 6–20)
CO2: 25 mmol/L (ref 22–32)
Calcium: 8.6 mg/dL — ABNORMAL LOW (ref 8.9–10.3)
Chloride: 102 mmol/L (ref 98–111)
Creatinine, Ser: 0.72 mg/dL (ref 0.61–1.24)
GFR, Estimated: >60 mL/min (ref >60)
Glucose, Bld: 113 mg/dL — ABNORMAL HIGH (ref 70–99)
Potassium: 3.9 mmol/L (ref 3.5–5.1)
Sodium: 133 mmol/L — ABNORMAL LOW (ref 135–145)

## 2021-08-10 LAB — MAGNESIUM: Magnesium: 1.9 mg/dL (ref 1.7–2.4)

## 2021-08-10 LAB — PHOSPHORUS: Phosphorus: 4.3 mg/dL (ref 2.5–4.6)

## 2021-08-10 MED ORDER — TRAVASOL 10 % IV SOLN
INTRAVENOUS | Status: AC
  Filled 2021-08-10: qty 1248

## 2021-08-10 MED ORDER — MAGNESIUM SULFATE 2 GM/50ML IV SOLN
2.0000 g | Freq: Once | INTRAVENOUS | Status: AC
Start: 2021-08-10 — End: 2021-08-10
  Administered 2021-08-10: 2 g via INTRAVENOUS
  Filled 2021-08-10: qty 50

## 2021-08-10 MED ORDER — ACETAMINOPHEN 10 MG/ML IV SOLN
1000.0000 mg | Freq: Four times a day (QID) | INTRAVENOUS | Status: AC
  Administered 2021-08-10 – 2021-08-11 (×4): 1000 mg via INTRAVENOUS
  Filled 2021-08-10 (×4): qty 100
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
## 2021-08-11 LAB — BASIC METABOLIC PANEL
Anion gap: 7 (ref 5–15)
BUN: 16 mg/dL (ref 6–20)
CO2: 26 mmol/L (ref 22–32)
Calcium: 9 mg/dL (ref 8.9–10.3)
Chloride: 104 mmol/L (ref 98–111)
Creatinine, Ser: 0.75 mg/dL (ref 0.61–1.24)
GFR, Estimated: >60 mL/min (ref >60)
Glucose, Bld: 100 mg/dL — ABNORMAL HIGH (ref 70–99)
Potassium: 4.1 mmol/L (ref 3.5–5.1)
Sodium: 137 mmol/L (ref 135–145)

## 2021-08-11 LAB — CBC
HCT: 32 % — ABNORMAL LOW (ref 39.0–52.0)
Hemoglobin: 10.2 g/dL — ABNORMAL LOW (ref 13.0–17.0)
MCH: 27.1 pg (ref 26.0–34.0)
MCHC: 31.9 g/dL (ref 30.0–36.0)
MCV: 84.9 fL (ref 80.0–100.0)
Platelets: 410 10*3/uL — ABNORMAL HIGH (ref 150–400)
RBC: 3.77 MIL/uL — ABNORMAL LOW (ref 4.22–5.81)
RDW: 14.4 % (ref 11.5–15.5)
WBC: 6.5 10*3/uL (ref 4.0–10.5)
nRBC: 0 % (ref 0.0–0.2)

## 2021-08-11 MED ORDER — TRAVASOL 10 % IV SOLN
INTRAVENOUS | Status: AC
  Filled 2021-08-11: qty 1248
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
## 2021-08-11 NOTE — Progress Notes (Signed)
? ?  Subjective/Chief Complaint: ?Pt feels like he is passing goof flatus ?No BM ?TNA ? ? ?Objective: ?Vital signs in last 24 hours: ?Temp:  [97.8 ?F (36.6 ?C)-98.6 ?F (37 ?C)] 98.1 ?F (36.7 ?C) (04/22 0736) ?Pulse Rate:  [88-108] 88 (04/22 0736) ?Resp:  [17-19] 18 (04/22 0736) ?BP: (133-147)/(97-109) 135/101 (04/22 0736) ?SpO2:  [100 %] 100 % (04/22 0736) ?Last BM Date : 07/23/21 ? ?Intake/Output from previous day: ?04/21 0701 - 04/22 0700 ?In: 3203.6 [I.V.:2803.6; IV Piggyback:400] ?Out: 2400 [Urine:1400; Emesis/NG output:1000] ?Intake/Output this shift: ?No intake/output data recorded. ? ?General appearance: alert and cooperative ?GI: soft, non-tender; bowel sounds normal; no masses,  no organomegaly and inc c/d/i ? ?Lab Results:  ?Recent Labs  ?  08/11/21 ?[id number]  ?WBC 6.5  ?HGB 10.2*  ?HCT 32.0*  ?PLT 410*  ? ?BMET ?Recent Labs  ?  08/10/21 ?0122 08/11/21 ?[id number]  ?NA 133* 137  ?K 3.9 4.1  ?CL 102 104  ?CO2 25 26  ?GLUCOSE 113* 100*  ?BUN 14 16  ?CREATININE 0.72 0.75  ?CALCIUM 8.6* 9.0  ? ?Assessment/Plan: ?POD 32 s/p colostomy takedown w/ 30 min LOA by Dr. Grandville Silos on 07/10/21.  ?SBO vs Ileus  ?- CT A/P 4/10 w/ high-grade small bowel obstruction w/ diameter of the small intestine up to 6.5 cm. No free air.  ?- Suspect post op SBO. Continue conservative management. No indication for emergency surgery. Usually try to wait these out till ~6 weeks post op to see if they improve on their own.  ?- Cont NGT on LIWS - NGT with bilious output. Will try clamping trials later today ?- Afebrile, WBC normal 4/19 ?- Keep K > 4 and Mg > 2 and mobilize for bowel function ?- Continue TPN (pre-alb 17.8, albumin 2.9)  ?- Mobilize as tolerated to help with bowel motility. Okay to clamp NGT for ambulation.  ?Tachy/HTN - Scheduled IV lopressor 7.5 mg q6h. Add PRN hydralazine.  ?Hyponatremia - 133 this am, recheck in am ?FEN - NPO, NGT LIWS, TPN ?VTE - SCDs, Lovenox ?ID - None  ?Foley - None, monitor I/O, voiding ?Dispo - Med-surg,  await bowel function.  ? LOS: 12 days  ? ? ?Ralene Ok ?08/11/2021 ? ?

## 2021-08-11 NOTE — Plan of Care (Signed)

## 2021-08-12 MED ORDER — TRAVASOL 10 % IV SOLN
INTRAVENOUS | Status: AC
  Filled 2021-08-12: qty 1248
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
## 2021-08-12 NOTE — Progress Notes (Signed)
NG tube removed at 0930, Pt is comfortable. ?

## 2021-08-12 NOTE — Progress Notes (Signed)
? ?  Subjective/Chief Complaint: ?Patient tolerated clamping since yesterday.  No nausea no vomiting. ?Patient states he is passing flatus however no bowel movement. ?He does state that his abdomen is less bloated. ? ? ?Objective: ?Vital signs in last 24 hours: ?Temp:  [97.8 ?F (36.6 ?C)-98.5 ?F (36.9 ?C)] 98.1 ?F (36.7 ?C) (04/23 0745) ?Pulse Rate:  [88-96] 94 (04/23 0745) ?Resp:  [18] 18 (04/23 0745) ?BP: (131-139)/(98-102) 134/102 (04/23 0745) ?SpO2:  [99 %-100 %] 100 % (04/23 0745) ?Last BM Date :  (utd) ? ?Intake/Output from previous day: ?04/22 0701 - 04/23 0700 ?In: 919.2 [I.V.:919.2] ?Out: 700 [Urine:700] ?Intake/Output this shift: ?No intake/output data recorded. ? ?General appearance: alert and cooperative ?Head: Normocephalic, without obvious abnormality, atraumatic ?GI: soft, non-tender; bowel sounds normal; no masses,  no organomegaly and incision clean dry and intact. ? ?Lab Results:  ?Recent Labs  ?  08/11/21 ?0444  ?WBC 6.5  ?HGB 10.2*  ?HCT 32.0*  ?PLT 410*  ? ?BMET ?Recent Labs  ?  08/10/21 ?0122 08/11/21 ?0444  ?NA 133* 137  ?K 3.9 4.1  ?CL 102 104  ?CO2 25 26  ?GLUCOSE 113* 100*  ?BUN 14 16  ?CREATININE 0.72 0.75  ?CALCIUM 8.6* 9.0  ? ?Assessment/Plan: ?POD 33 s/p colostomy takedown w/ 30 min LOA by Dr. Janee Morn on 07/10/21.  ?SBO vs Ileus  ?- CT A/P 4/10 w/ high-grade small bowel obstruction w/ diameter of the small intestine up to 6.5 cm. No free air.  ?- Suspect post op SBO. Continue conservative management. No indication for emergency surgery. Usually try to wait these out till ~6 weeks post op to see if they improve on their own.  ?-DC NG tube today, start clear liquids. ?- Afebrile, WBC normal 4/19 ?- Keep K > 4 and Mg > 2 and mobilize for bowel function ?- Continue TPN (pre-alb 17.8, albumin 2.9)  ?- Mobilize as tolerated to help with bowel motility. Okay to clamp NGT for ambulation.  ?Tachy/HTN - Scheduled IV lopressor 7.5 mg q6h. Add PRN hydralazine.  ?Hyponatremia - 133 this am, recheck  in am ?FEN -clear liquids, TPN ?VTE - SCDs, Lovenox ?ID - None  ?Foley - None, monitor I/O, voiding ?Dispo - Med-surg, await bowel function.  ? LOS: 13 days  ? ? ?Axel Filler ?08/12/2021 ? ?

## 2021-08-13 LAB — COMPREHENSIVE METABOLIC PANEL
ALT: 38 U/L (ref 0–44)
AST: 26 U/L (ref 15–41)
Albumin: 3 g/dL — ABNORMAL LOW (ref 3.5–5.0)
Alkaline Phosphatase: 64 U/L (ref 38–126)
Anion gap: 7 (ref 5–15)
BUN: 15 mg/dL (ref 6–20)
CO2: 27 mmol/L (ref 22–32)
Calcium: 8.9 mg/dL (ref 8.9–10.3)
Chloride: 102 mmol/L (ref 98–111)
Creatinine, Ser: 0.7 mg/dL (ref 0.61–1.24)
GFR, Estimated: >60 mL/min (ref >60)
Glucose, Bld: 105 mg/dL — ABNORMAL HIGH (ref 70–99)
Potassium: 4 mmol/L (ref 3.5–5.1)
Sodium: 136 mmol/L (ref 135–145)
Total Bilirubin: 0.1 mg/dL — ABNORMAL LOW (ref 0.3–1.2)
Total Protein: 6.8 g/dL (ref 6.5–8.1)

## 2021-08-13 LAB — PHOSPHORUS: Phosphorus: 5.1 mg/dL — ABNORMAL HIGH (ref 2.5–4.6)

## 2021-08-13 LAB — MAGNESIUM: Magnesium: 1.8 mg/dL (ref 1.7–2.4)

## 2021-08-13 LAB — TRIGLYCERIDES: Triglycerides: 50 mg/dL (ref <150)

## 2021-08-13 MED ORDER — KETOROLAC TROMETHAMINE 30 MG/ML IJ SOLN
30.0000 mg | Freq: Three times a day (TID) | INTRAMUSCULAR | Status: AC
  Administered 2021-08-13 – 2021-08-15 (×9): 30 mg via INTRAVENOUS
  Filled 2021-08-13 (×9): qty 1

## 2021-08-13 MED ORDER — PANTOPRAZOLE SODIUM 40 MG PO TBEC
40.0000 mg | DELAYED_RELEASE_TABLET | Freq: Every day | ORAL | Status: DC
  Administered 2021-08-13 – 2021-08-19 (×7): 40 mg via ORAL
  Filled 2021-08-13 (×7): qty 1

## 2021-08-13 MED ORDER — GLYCERIN (LAXATIVE) 2 G RE SUPP
1.0000 | Freq: Every day | RECTAL | Status: DC | PRN
  Filled 2021-08-13: qty 1

## 2021-08-13 MED ORDER — POLYETHYLENE GLYCOL 3350 17 G PO PACK
17.0000 g | PACK | Freq: Every day | ORAL | Status: DC
  Administered 2021-08-13 – 2021-08-22 (×4): 17 g via ORAL
  Filled 2021-08-13 (×10): qty 1

## 2021-08-13 MED ORDER — MAGNESIUM SULFATE 2 GM/50ML IV SOLN
2.0000 g | Freq: Once | INTRAVENOUS | Status: AC
  Administered 2021-08-13: 2 g via INTRAVENOUS
  Filled 2021-08-13: qty 50

## 2021-08-13 MED ORDER — GLYCERIN (LAXATIVE) 2 G RE SUPP
1.0000 | Freq: Once | RECTAL | Status: DC
  Filled 2021-08-13: qty 1

## 2021-08-13 MED ORDER — OXYCODONE HCL 5 MG PO TABS
5.0000 mg | ORAL_TABLET | ORAL | Status: DC | PRN
  Administered 2021-08-13 – 2021-08-26 (×32): 10 mg via ORAL
  Filled 2021-08-13 (×34): qty 2

## 2021-08-13 MED ORDER — HYDROMORPHONE HCL 1 MG/ML IJ SOLN
0.5000 mg | INTRAMUSCULAR | Status: DC | PRN
  Administered 2021-08-13 – 2021-08-22 (×52): 1 mg via INTRAVENOUS
  Filled 2021-08-13 (×57): qty 1

## 2021-08-13 MED ORDER — METHOCARBAMOL 500 MG PO TABS
1000.0000 mg | ORAL_TABLET | Freq: Three times a day (TID) | ORAL | Status: DC
  Administered 2021-08-13 – 2021-08-19 (×18): 1000 mg via ORAL
  Filled 2021-08-13 (×19): qty 2

## 2021-08-13 MED ORDER — ACETAMINOPHEN 500 MG PO TABS
1000.0000 mg | ORAL_TABLET | Freq: Four times a day (QID) | ORAL | Status: DC
Start: 2021-08-13 — End: 2021-08-19
  Administered 2021-08-13 – 2021-08-19 (×10): 1000 mg via ORAL
  Filled 2021-08-13 (×14): qty 2

## 2021-08-13 MED ORDER — TRAVASOL 10 % IV SOLN
INTRAVENOUS | Status: AC
  Filled 2021-08-13: qty 1248
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
## 2021-08-13 NOTE — Plan of Care (Signed)
  Problem: Health Behavior/Discharge Planning: Goal: Ability to manage health-related needs will improve Outcome: Progressing   Problem: Clinical Measurements: Goal: Will remain free from infection Outcome: Progressing   Problem: Pain Managment: Goal: General experience of comfort will improve Outcome: Progressing   

## 2021-08-13 NOTE — Progress Notes (Signed)
Pt was advance to a full liquid diet today, tolerated fair, pt complaining of his stomach still hurting and bloated. He refused the suppository. Pt did ambulated in the hall. ?

## 2021-08-13 NOTE — Progress Notes (Signed)
? ?  Subjective/Chief Complaint: ?No nausea no vomiting with CLD.  Had BM this morning with a lot of pain.  States stool was hard little pellet type balls.  Still with flatus.  Doesn't feel bloated ? ?Objective: ?Vital signs in last 24 hours: ?Temp:  [98.2 ?F (36.8 ?C)-98.5 ?F (36.9 ?C)] 98.2 ?F (36.8 ?C) (04/24 0530) ?Pulse Rate:  [94-105] 100 (04/24 0530) ?Resp:  [18] 18 (04/24 0530) ?BP: (127-150)/(97-102) 127/97 (04/24 0530) ?SpO2:  [100 %] 100 % (04/24 0530) ?Last BM Date : 08/13/21 ? ?Intake/Output from previous day: ?04/23 0701 - 04/24 0700 ?In: 2937.8 [P.O.:560; I.V.:2377.8] ?Out: -  ?Intake/Output this shift: ?No intake/output data recorded. ? ?PE: ?Gen: NAD ?Heart: regular ?Lungs: respiratory effort nonlabored ?Abd: soft, still seems bloated, but unclear how this compares since it is my first day seeing him.  +BS, not overtly tender to palpation, all incisions are well healed. ? ?Lab Results:  ?Recent Labs  ?  08/11/21 ?0444  ?WBC 6.5  ?HGB 10.2*  ?HCT 32.0*  ?PLT 410*  ? ?BMET ?Recent Labs  ?  08/11/21 ?0444 08/13/21 ?0424  ?NA 137 136  ?K 4.1 4.0  ?CL 104 102  ?CO2 26 27  ?GLUCOSE 100* 105*  ?BUN 16 15  ?CREATININE 0.75 0.70  ?CALCIUM 9.0 8.9  ? ?Assessment/Plan: ?POD 34, s/p colostomy takedown w/ 30 min LOA by Dr. Janee Morn on 07/10/21.  ?SBO vs Ileus  ?- CT A/P 4/10 w/ high-grade small bowel obstruction w/ diameter of the small intestine up to 6.5 cm. No free air.  ?- Suspect post op SBO. Continue conservative management. No indication for emergency surgery. Usually try to wait these out till ~6 weeks post op to see if they improve on their own.  ?-adv to FLD today to see how he does ?-start miralax (at his request) and try a glycerin suppository to help soften up stool from below ?-adjust pain meds to oral and dilaudid for breakthrough ?-cont to mobilize and pulm toilet ?- Continue TPN (pre-alb 17.8, albumin 2.9)  ?Tachy/HTN - Scheduled IV lopressor 7.5 mg q6h. Improved. Add PRN hydralazine.   ?Hyponatremia - resolved ?FEN -full liquids, TPN ?VTE - SCDs, Lovenox (refusing) ?ID - None  ?Foley - None, monitor I/O, voiding ?Dispo - Med-surg, await bowel function.  ? LOS: 14 days  ? ? ?Letha Cape ?08/13/2021 ? ?

## 2021-08-14 MED ORDER — SIMETHICONE 40 MG/0.6ML PO SUSP
80.0000 mg | Freq: Four times a day (QID) | ORAL | Status: DC
  Administered 2021-08-14 – 2021-08-26 (×47): 80 mg via ORAL
  Filled 2021-08-14 (×56): qty 1.2

## 2021-08-14 MED ORDER — TRAVASOL 10 % IV SOLN
INTRAVENOUS | Status: AC
  Filled 2021-08-14: qty 1248

## 2021-08-14 MED ORDER — ENSURE ENLIVE PO LIQD
237.0000 mL | Freq: Three times a day (TID) | ORAL | Status: DC
  Administered 2021-08-14 – 2021-08-16 (×3): 237 mL via ORAL
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
## 2021-08-14 NOTE — Progress Notes (Signed)
Mobility Specialist Progress Note  ? ? 08/14/21 1113  ?Mobility  ?Activity Ambulated independently in hallway  ?Level of Assistance Independent  ?Assistive Device None  ?Distance Ambulated (ft) 570 ft  ?Activity Response Tolerated well  ?$Mobility charge 1 Mobility  ? ?Pt received in bed and agreeable. No complaints on walk. Returned to [facility] with visitor present.    ? ?[place] ?Mobility Specialist  ?Primary: 5N M.S. Phone: [phone] ?Secondary: 6N M.S. Phone: [phone] ?  ?
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
## 2021-08-14 NOTE — Progress Notes (Signed)
? ?  Subjective/Chief Complaint: ?No nausea no vomiting with FLD but feels more full and wants to go back to CLD.  Had another stool this morning.  Ambulating some.  ? ?Objective: ?Vital signs in last 24 hours: ?Temp:  [98.1 ?F (36.7 ?C)-99.2 ?F (37.3 ?C)] 98.2 ?F (36.8 ?C) (04/25 7622) ?Pulse Rate:  [97-101] 99 (04/25 0528) ?Resp:  [16-18] 18 (04/25 0528) ?BP: (131-148)/(93-100) 142/100 (04/25 0528) ?SpO2:  [100 %] 100 % (04/25 0528) ?Last BM Date : 08/13/21 ? ?Intake/Output from previous day: ?04/24 0701 - 04/25 0700 ?In: 1436.6 [I.V.:1436.6] ?Out: -  ?Intake/Output this shift: ?No intake/output data recorded. ? ?PE: ?Gen: NAD ?Heart: regular ?Lungs: respiratory effort nonlabored ?Abd: soft, slightly more bloated than yesterday.  +BS, not overtly tender to palpation, all incisions are well healed. ? ?Lab Results:  ?No results for input(s): WBC, HGB, HCT, PLT in the last 72 hours. ? ?BMET ?Recent Labs  ?  08/13/21 ?0424  ?NA 136  ?K 4.0  ?CL 102  ?CO2 27  ?GLUCOSE 105*  ?BUN 15  ?CREATININE 0.70  ?CALCIUM 8.9  ? ?Assessment/Plan: ?POD 35, s/p colostomy takedown w/ 30 min LOA by Dr. Janee Morn on 07/10/21.  ?SBO vs Ileus  ?- CT A/P 4/10 w/ high-grade small bowel obstruction w/ diameter of the small intestine up to 6.5 cm. No free air.  ?- Suspect post op SBO. Continue conservative management. No indication for emergency surgery. Usually try to wait these out till ~6 weeks post op to see if they improve on their own.  ?-decrease diet back to CLD, Ensure, due to increase in bloating. ?-start miralax  ?-adjust pain meds to oral and dilaudid for breakthrough ?-cont to mobilize and pulm toilet ?-schedule simethicone  ?- Continue TPN (pre-alb 17.8, albumin 2.9)  ?Tachy/HTN - Scheduled IV lopressor 7.5 mg q6h. Improved. Add PRN hydralazine.  ?Hyponatremia - resolved ?FEN -fCLD, ensure, TPN ?VTE - SCDs, Lovenox (refusing) ?ID - None  ?Foley - None, monitor I/O, voiding ?Dispo - Med-surg, await bowel function.  ? LOS: 15 days   ? ? ?Letha Cape ?08/14/2021 ? ?

## 2021-08-15 MED ORDER — TRAVASOL 10 % IV SOLN
INTRAVENOUS | Status: AC
  Filled 2021-08-15: qty 1248

## 2021-08-15 NOTE — Progress Notes (Signed)
Pt continues to report pain all the time mildly relieved with pain medication. Ambulated in hallway. Reports gas but no bowel movement. Will continue to monitor ?

## 2021-08-15 NOTE — Progress Notes (Signed)
? ?  Subjective/Chief Complaint: ?No nausea no vomiting with decrease back to CLD.  Still having some pain on his right side.  Walking.  Last BM was yesterday morning. ? ?Objective: ?Vital signs in last 24 hours: ?Temp:  [97.8 ?F (36.6 ?C)-98.7 ?F (37.1 ?C)] 97.8 ?F (36.6 ?C) (04/26 0525) ?Pulse Rate:  [91-100] 100 (04/26 0525) ?Resp:  [17-18] 18 (04/26 0525) ?BP: (99-141)/(81-106) 99/81 (04/26 0525) ?SpO2:  [99 %-100 %] 100 % (04/26 0525) ?Weight:  [72.7 kg] 72.7 kg (04/25 1014) ?Last BM Date : 08/13/21 ? ?Intake/Output from previous day: ?04/25 0701 - 04/26 0700 ?In: 2100.8 [I.V.:2100.8] ?Out: -  ?Intake/Output this shift: ?No intake/output data recorded. ? ?PE: ?Gen: NAD ?Heart: regular ?Lungs: respiratory effort nonlabored ?Abd: soft, slightly softer today.  +BS, not overtly tender to palpation, all incisions are well healed. ? ?Lab Results:  ?No results for input(s): WBC, HGB, HCT, PLT in the last 72 hours. ? ?BMET ?Recent Labs  ?  08/13/21 ?0424  ?NA 136  ?K 4.0  ?CL 102  ?CO2 27  ?GLUCOSE 105*  ?BUN 15  ?CREATININE 0.70  ?CALCIUM 8.9  ? ?Assessment/Plan: ?POD 36, s/p colostomy takedown w/ 30 min LOA by Dr. Grandville Silos on 07/10/21.  ?SBO vs Ileus  ?- CT A/P 4/10 w/ high-grade small bowel obstruction w/ diameter of the small intestine up to 6.5 cm. No free air.  ?- Suspect post op SBO. Continue conservative management. No indication for emergency surgery. Usually try to wait these out till ~6 weeks post op to see if they improve on their own.  ?-cont CLD, Ensure ?-miralax  ?-adjust pain meds to oral and dilaudid for breakthrough ?-cont to mobilize and pulm toilet ?-schedule simethicone  ?- Continue TPN (pre-alb 17.8, albumin 2.9)  ?-conts to complain of abdominal pain.  Suspect this is secondary to bowel dilatation as his last scan a couple weeks ago had no suggestion of infectious issues intra-abdominally.  He is AF and WBC normal.  Doubt needs repeat CT, but may be something needed given persistent issues with  diet intake. ?Tachy/HTN - Scheduled IV lopressor 7.5 mg q6h. Improved. Add PRN hydralazine.  ?Hyponatremia - resolved ?FEN -CLD, ensure, TPN ?VTE - SCDs, Lovenox (refusing) ?ID - None  ?Foley - None, monitor I/O, voiding ?Dispo - Med-surg, await bowel function.  ? LOS: 16 days  ? ? ?Henreitta Cea ?08/15/2021 ? ?

## 2021-08-15 NOTE — Progress Notes (Signed)
Trauma Event Note ? ? ? ?TRN at bedside to round, resting in bed at this time. VSS, denies needs at this time. ? ?Last imported Vital Signs ?BP (!) 141/106 (BP Location: Left Arm)   Pulse 92   Temp 97.8 ?F (36.6 ?C) (Oral)   Resp 18   Ht 5\' 11"  (1.803 m)   Wt 160 lb 4.4 oz (72.7 kg)   SpO2 100%   BMI 22.35 kg/m?  ? ?Trending CBC ?No results for input(s): WBC, HGB, HCT, PLT in the last 72 hours. ? ?Trending Coag's ?No results for input(s): APTT, INR in the last 72 hours. ? ?Trending BMET ?Recent Labs  ?  08/13/21 ?0424  ?NA 136  ?K 4.0  ?CL 102  ?CO2 27  ?BUN 15  ?CREATININE 0.70  ?GLUCOSE 105*  ? ? ? ? ?[name] [name]  ?Trauma Response RN ? ?Please call TRN at [phone] for further assistance. ? ? ?  ?

## 2021-08-15 NOTE — Progress Notes (Signed)
Mobility Specialist Progress Note: ? ? 08/15/21 1352  ?Mobility  ?Activity Ambulated with assistance in hallway  ?Level of Assistance Independent  ?Assistive Device None  ?Distance Ambulated (ft) 450 ft  ?Activity Response Tolerated well  ?$Mobility charge 1 Mobility  ? ?Pt received walking. Complaints of 8/10 abdominal pain. Left ambulating with all needs met.  ? ?Alejandro Shelton ?Mobility Specialist ?Primary Phone [phone] ? ?
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
## 2021-08-16 LAB — COMPREHENSIVE METABOLIC PANEL
ALT: 31 U/L (ref 0–44)
AST: 18 U/L (ref 15–41)
Albumin: 2.7 g/dL — ABNORMAL LOW (ref 3.5–5.0)
Alkaline Phosphatase: 57 U/L (ref 38–126)
Anion gap: 5 (ref 5–15)
BUN: 15 mg/dL (ref 6–20)
CO2: 26 mmol/L (ref 22–32)
Calcium: 8.7 mg/dL — ABNORMAL LOW (ref 8.9–10.3)
Chloride: 105 mmol/L (ref 98–111)
Creatinine, Ser: 0.7 mg/dL (ref 0.61–1.24)
GFR, Estimated: >60 mL/min (ref >60)
Glucose, Bld: 98 mg/dL (ref 70–99)
Potassium: 4 mmol/L (ref 3.5–5.1)
Sodium: 136 mmol/L (ref 135–145)
Total Bilirubin: 0.2 mg/dL — ABNORMAL LOW (ref 0.3–1.2)
Total Protein: 6.3 g/dL — ABNORMAL LOW (ref 6.5–8.1)

## 2021-08-16 LAB — PHOSPHORUS: Phosphorus: 4.3 mg/dL (ref 2.5–4.6)

## 2021-08-16 LAB — MAGNESIUM: Magnesium: 1.7 mg/dL (ref 1.7–2.4)

## 2021-08-16 MED ORDER — TRAVASOL 10 % IV SOLN
INTRAVENOUS | Status: AC
  Filled 2021-08-16: qty 1248

## 2021-08-16 MED ORDER — METOPROLOL TARTRATE 25 MG PO TABS
37.5000 mg | ORAL_TABLET | Freq: Two times a day (BID) | ORAL | Status: DC
  Administered 2021-08-16 – 2021-08-26 (×19): 37.5 mg via ORAL
  Filled 2021-08-16 (×22): qty 1

## 2021-08-16 MED ORDER — MAGNESIUM SULFATE 4 GM/100ML IV SOLN
4.0000 g | Freq: Once | INTRAVENOUS | Status: AC
  Administered 2021-08-16: 4 g via INTRAVENOUS
  Filled 2021-08-16: qty 100
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
## 2021-08-16 NOTE — Progress Notes (Signed)
Patient noted without BM x 3 days, continues to take pain medications consistently throughout the day, also with known ileus. Pt noted to be refusing multiple meds throughout day and night. Pt encouraged to be compliant with current bowel regimen due to risk of worsening condition. Dr. Bobbye Morton made aware, no new orders noted at this time.  ?

## 2021-08-16 NOTE — Progress Notes (Addendum)
? ?  Subjective/Chief Complaint: ?Feeling better today and wanting to try FLD again.  He's ready to get out of here and go home.  Tolerating Ensures. ? ?Objective: ?Vital signs in last 24 hours: ?Temp:  [98.3 ?F (36.8 ?C)-98.4 ?F (36.9 ?C)] 98.4 ?F (36.9 ?C) (04/27 4098) ?Pulse Rate:  [83-98] 83 (04/27 0609) ?Resp:  [16] 16 (04/27 1191) ?BP: (120-150)/(83-95) 134/83 (04/27 4782) ?SpO2:  [100 %] 100 % (04/27 0609) ?Last BM Date : 08/13/21 ? ?Intake/Output from previous day: ?04/26 0701 - 04/27 0700 ?In: 1729.8 [I.V.:1729.8] ?Out: -  ?Intake/Output this shift: ?No intake/output data recorded. ? ?PE: ?Gen: NAD ?Heart: regular ?Lungs: respiratory effort nonlabored ?Abd: soft, slightly softer today.  +BS, not overtly tender to palpation, all incisions are well healed.  Few sutures through skin and removed today. ? ?Lab Results:  ?No results for input(s): WBC, HGB, HCT, PLT in the last 72 hours. ? ?BMET ?Recent Labs  ?  08/16/21 ?0322  ?NA 136  ?K 4.0  ?CL 105  ?CO2 26  ?GLUCOSE 98  ?BUN 15  ?CREATININE 0.70  ?CALCIUM 8.7*  ? ?Assessment/Plan: ?POD 37, s/p colostomy takedown w/ 30 min LOA by Dr. Janee Morn on 07/10/21.  ?SBO vs Ileus  ?- CT A/P 4/10 w/ high-grade small bowel obstruction w/ diameter of the small intestine up to 6.5 cm. No free air.  ?- Suspect post op SBO. Continue conservative management. No indication for emergency surgery. Usually try to wait these out till ~6 weeks post op to see if they improve on their own.  ?-try FLD again, Ensure ?-miralax  ?-adjust pain meds to oral and dilaudid for breakthrough ?-cont to mobilize and pulm toilet ?-schedule simethicone  ?- Continue TPN (pre-alb 17.8, albumin 2.9)  ?Tachy/HTN - Scheduled IV lopressor 7.5 mg q6h. Improved. Add PRN hydralazine.  ?Hyponatremia - resolved ?FEN -FLD, ensure, TPN ?VTE - SCDs, Lovenox (refusing) ?ID - None  ?Foley - None, monitor I/O, voiding ?Dispo - Med-surg, await bowel function.  ? LOS: 17 days  ? ? ?Letha Cape ?08/16/2021 ? ?

## 2021-08-16 NOTE — Progress Notes (Signed)
Pt continues to refuse lovenox, Dr. Bedelia Person made aware.  ?

## 2021-08-17 ENCOUNTER — Inpatient Hospital Stay (HOSPITAL_COMMUNITY): Payer: Commercial Managed Care - HMO

## 2021-08-17 LAB — CBC
HCT: 30.3 % — ABNORMAL LOW (ref 39.0–52.0)
Hemoglobin: 10 g/dL — ABNORMAL LOW (ref 13.0–17.0)
MCH: 32.1 pg (ref 26.0–34.0)
MCHC: 33 g/dL (ref 30.0–36.0)
MCV: 97.1 fL (ref 80.0–100.0)
Platelets: 349 10*3/uL (ref 150–400)
RBC: 3.12 MIL/uL — ABNORMAL LOW (ref 4.22–5.81)
RDW: 15.9 % — ABNORMAL HIGH (ref 11.5–15.5)
WBC: 5 10*3/uL (ref 4.0–10.5)
nRBC: 0 % (ref 0.0–0.2)

## 2021-08-17 LAB — GLUCOSE, CAPILLARY: Glucose-Capillary: 109 mg/dL — ABNORMAL HIGH (ref 70–99)

## 2021-08-17 MED ORDER — HYDROMORPHONE HCL 1 MG/ML IJ SOLN
1.0000 mg | Freq: Once | INTRAMUSCULAR | Status: AC
  Administered 2021-08-17: 1 mg via INTRAVENOUS

## 2021-08-17 MED ORDER — TRAVASOL 10 % IV SOLN
INTRAVENOUS | Status: AC
  Filled 2021-08-17: qty 1248

## 2021-08-17 MED ORDER — IOHEXOL 300 MG/ML  SOLN
100.0000 mL | Freq: Once | INTRAMUSCULAR | Status: AC | PRN
  Administered 2021-08-17: 100 mL via INTRAVENOUS
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
## 2021-08-17 NOTE — Significant Event (Signed)
Rapid Response Event Note  ? ?Reason for Call :  ?Sever abdominal pain ? ?Initial Focused Assessment:  ?Per pt/chart: He has been here for a couple of weeks with SBO sp colostomy takedown 07/10/21.  His abdomen is generally firm an distended and he has mild to moderate pain.  He is  currently on TPN and full liquid diet. ? ?He currently has sever pain after drinking his full liquid diet tray and medications ?Abdomen is distended and firm.  He does have some bowel sounds. ?Lung sounds clear ?Heart tones regular ?He is Alert and Oriented x4 ? ?BP 148/117  HR 76  RR 16  O2 sat 100% on RA ?Oral temp 98.1 ? ? ?Interventions:  ?Abdominal Xray ?Chest Xray ?Labs drawn ?Additional dose of 1mg  Diluadid IV ? ?Changed to NPO status ? ?Plan of Care:  ?CT abdomen and Pelvis ? ? ?Event Summary:  ? ?MD Notified: PA ?Call Time: 1056 ?Arrival Time: 1105 ?End Time: 1155 ? ?Carl Best, RN ?

## 2021-08-17 NOTE — Progress Notes (Signed)
Patient complained of feeling like he was going to pass out. Went into room noticed patient was sweating profusely but cool to the touch. He complained of 10/10 abdominal pain. Took vitals and paged PA. PA placed abdominal x ray and abdominal ct orders. ? ?Also contacted rapid response to check patient. Rapid response recommended asking for an additional one time dilaudid dose. Asked PA, received orders. Administered one time dose.  ?

## 2021-08-17 NOTE — Progress Notes (Signed)
? ?  Subjective/Chief Complaint: ?Back on FLD yesterday but states he didn't really eat. Abdominal pain/gas pain is stable. No nausea/emesis. Passing flatus. Today is day 4 without BM. He has been refusing some meds because he states there are too many at one time ? ?Objective: ?Vital signs in last 24 hours: ?Temp:  [98 ?F (36.7 ?C)-98.4 ?F (36.9 ?C)] 98.4 ?F (36.9 ?C) (04/28 0962) ?Pulse Rate:  [78-86] 83 (04/28 8366) ?Resp:  [16] 16 (04/28 2947) ?BP: (116-136)/(95-96) 136/96 (04/28 6546) ?SpO2:  [100 %] 100 % (04/28 5035) ?Last BM Date : 08/13/21 ? ?Intake/Output from previous day: ?04/27 0701 - 04/28 0700 ?In: 2622.1 [P.O.:240; I.V.:2282.1; IV Piggyback:100] ?Out: -  ?Intake/Output this shift: ?No intake/output data recorded. ? ?PE: ?Gen: NAD ?Heart: regular ?Lungs: respiratory effort nonlabored ?Abd: soft.  +BS, not overtly tender to palpation, all incisions are well healed ? ?Lab Results:  ?No results for input(s): WBC, HGB, HCT, PLT in the last 72 hours. ? ?BMET ?Recent Labs  ?  08/16/21 ?0322  ?NA 136  ?K 4.0  ?CL 105  ?CO2 26  ?GLUCOSE 98  ?BUN 15  ?CREATININE 0.70  ?CALCIUM 8.7*  ? ? ?Assessment/Plan: ?POD 38, s/p colostomy takedown w/ 30 min LOA by Dr. Janee Morn on 07/10/21.  ?SBO vs Ileus  ?- CT A/P 4/10 w/ high-grade small bowel obstruction w/ diameter of the small intestine up to 6.5 cm. No free air.  ?- Suspect post op SBO. Continue conservative management. No indication for emergency surgery. Usually try to wait these out till ~6 weeks post op to see if they improve on their own.  ?- continue FLD today, Ensure ?-miralax  ?-adjust pain meds to oral and dilaudid for breakthrough ?-cont to mobilize and pulm toilet ?-schedule simethicone  ?- Continue TPN (pre-alb 17.8, albumin 2.9)  ?Tachy/HTN - Scheduled IV lopressor 7.5 mg q6h. Improved. Add PRN hydralazine.  ?Hyponatremia - resolved ?FEN -FLD, ensure, TPN ?VTE - SCDs, Lovenox (refusing) ?ID - None  ?Foley - None, monitor I/O, voiding ?Dispo - Med-surg,  await bowel function.  ? LOS: 18 days  ? ?Eric Form, PA-C ?[facility] ?08/17/2021, 7:57 AM ?Please see Amion for pager number during day hours 7:00am-4:30pm ? ? ?

## 2021-08-18 MED ORDER — TRAVASOL 10 % IV SOLN
INTRAVENOUS | Status: AC
  Filled 2021-08-18: qty 1248
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
## 2021-08-18 NOTE — Progress Notes (Signed)
[facility] ?Progress Note ? ?   ?Subjective: ?CC-  ?Still having some abdominal pain but it is much less than yesterday. Passing some flatus, last BM 4 days ago. Denies any current n/v. ?CT yesterday showed persistent high-grade small bowel obstruction pattern with some improvement from comparison exam 07/30/2021 ? ?Objective: ?Vital signs in last 24 hours: ?Temp:  [98.4 ?F (36.9 ?C)-98.9 ?F (37.2 ?C)] 98.6 ?F (37 ?C) (04/29 0725) ?Pulse Rate:  [78-94] 78 (04/29 0725) ?Resp:  [12-20] 12 (04/29 0725) ?BP: (123-128)/(84-93) 124/90 (04/29 0725) ?SpO2:  [100 %] 100 % (04/29 0725) ?Last BM Date : 08/13/21 ? ?Intake/Output from previous day: ?04/28 0701 - 04/29 0700 ?In: 1521.8 [I.V.:1521.8] ?Out: 3 [Urine:3] ?Intake/Output this shift: ?No intake/output data recorded. ? ?PE: ?Gen: NAD ?Heart: regular ?Lungs: respiratory effort nonlabored ?Abd: soft.  +BS, not overtly tender to palpation, all incisions are well healed ? ?Lab Results:  ?Recent Labs  ?  08/17/21 ?1115  ?WBC 5.0  ?HGB 10.0*  ?HCT 30.3*  ?PLT 349  ? ?BMET ?Recent Labs  ?  08/16/21 ?0322  ?NA 136  ?K 4.0  ?CL 105  ?CO2 26  ?GLUCOSE 98  ?BUN 15  ?CREATININE 0.70  ?CALCIUM 8.7*  ? ?PT/INR ?No results for input(s): LABPROT, INR in the last 72 hours. ?CMP  ?   ?Component Value Date/Time  ? NA 136 08/16/2021 0322  ? NA 138 02/09/2021 1036  ? K 4.0 08/16/2021 0322  ? CL 105 08/16/2021 0322  ? CO2 26 08/16/2021 0322  ? GLUCOSE 98 08/16/2021 0322  ? BUN 15 08/16/2021 0322  ? BUN 11 02/09/2021 1036  ? CREATININE 0.70 08/16/2021 0322  ? CALCIUM 8.7 (L) 08/16/2021 0322  ? PROT 6.3 (L) 08/16/2021 0322  ? ALBUMIN 2.7 (L) 08/16/2021 0322  ? AST 18 08/16/2021 0322  ? ALT 31 08/16/2021 0322  ? ALKPHOS 57 08/16/2021 0322  ? BILITOT 0.2 (L) 08/16/2021 0322  ? GFRNONAA >60 08/16/2021 0322  ? GFRAA >90 12/12/2012 0526  ? ?Lipase  ?   ?Component Value Date/Time  ? LIPASE 48 07/30/2021 1358  ? ? ? ? ? ?Studies/Results: ?CT ABDOMEN PELVIS W CONTRAST ? ?Result Date:  08/17/2021 ?CLINICAL DATA:  Postop colostomy reversal. Small bowel obstruction. Abdominal pain. EXAM: CT ABDOMEN AND PELVIS WITH CONTRAST TECHNIQUE: Multidetector CT imaging of the abdomen and pelvis was performed using the standard protocol following bolus administration of intravenous contrast. RADIATION DOSE REDUCTION: This exam was performed according to the departmental dose-optimization program which includes automated exposure control, adjustment of the mA and/or kV according to patient size and/or use of iterative reconstruction technique. CONTRAST:  OMNIPAQUE IOHEXOL 300 MG/ML  SOLN COMPARISON:  Postoperative CT 07/30/2021 FINDINGS: Lower chest: Lung bases are clear. Hepatobiliary: No focal hepatic lesion. Mild periportal edema. Gallbladder normal. Pancreas: Pancreas is normal. No ductal dilatation. No pancreatic inflammation. Spleen: Normal spleen Adrenals/urinary tract: Adrenal glands normal. Cortical scarring of the RIGHT kidney. Bilateral mild hydroureter. No obstructing lesion identified. Bladder mildly distended. Stomach/Bowel: Stomach is fluid-filled. There is dilatation of the proximal and mid small bowel. Air-fluid levels within the dilated small bowel. The small bowel measures 4.6 cm in diameter (image 43/3) compared to 6.3 on comparison exam at similar level. More distally the small bowel measures 3.7 cm (image 60/3) compared to 4.8. There is less dilated small bowel within the pelvis compared to prior. There is no pneumatosis or portal venous gas. No intraperitoneal free air. The colon is collapsed distally. There formed stool  within the proximal colon in the vicinity of the anastomosis Centrally within the peritoneal space there is a rounded collection measuring 3.5 x 5.0 cm (image 46/3) which has central soft tissue attenuation. Collection has appearance of a loop of small bowel cannot confidently connected no other loops of bowel. Findings similar to comparison exam. Vascular/Lymphatic:  Abdominal aorta is normal caliber. No periportal or retroperitoneal adenopathy. No pelvic adenopathy. Reproductive: Unremarkable Other: No free fluid. Musculoskeletal: Unremarkable IMPRESSION: 1. Persistent high-grade small bowel obstruction pattern with some improvement from comparison exam 07/30/2021. 2. No portal venous gas, intraperitoneal free air, or small bowel pneumatosis. 3. Unusual loop of isolated small bowel centrally within the peritoneal space could represent site of obstruction. Electronically Signed   By: Genevive Bi M.D.   On: 08/17/2021 14:00  ? ?DG CHEST PORT 1 VIEW ? ?Result Date: 08/17/2021 ?CLINICAL DATA:  Shortness of breath EXAM: PORTABLE CHEST 1 VIEW COMPARISON:  Chest x-ray dated August 02, 2020 FINDINGS: Unchanged position right arm PICC. Interval removal of enteric tube. Cardiac and mediastinal contours are within normal limits. Lungs are clear. No pleural effusion pneumothorax. IMPRESSION: No active disease. Electronically Signed   By: Allegra Lai M.D.   On: 08/17/2021 11:15  ? ?DG Abd Portable 1V ? ?Result Date: 08/17/2021 ?CLINICAL DATA:  Nausea. Emesis. Shortness of breath. Small bowel obstruction. EXAM: PORTABLE ABDOMEN - 1 VIEW COMPARISON:  08/07/2021; CT abdomen pelvis-07/30/2021 FINDINGS: Interval removal of enteric tube. Overall decreased gaseous distension of the small bowel however there is persistent moderate gaseous distension of several scattered loops with index loop within the left upper abdominal quadrant measuring 5.4 cm in diameter. This finding is associated with a conspicuous paucity of distal colonic gas. Nondiagnostic evaluation for pneumoperitoneum secondary to supine positioning and exclusion of the lower thorax. No definite pneumatosis or portal venous gas. No definite acute osseous abnormalities. Old/healed right eleventh rib fracture. IMPRESSION: 1. Findings suggestive of slightly improved though persistent small-bowel obstruction. 2. Interval removal of  enteric tube. Electronically Signed   By: Simonne Come M.D.   On: 08/17/2021 11:18   ? ?Anti-infectives: ?Anti-infectives (From admission, onward)  ? ? None  ? ?  ? ? ? ?Assessment/Plan ?POD 39, s/p colostomy takedown w/ 30 min LOA by Dr. Janee Morn on 07/10/21.  ?SBO vs Ileus  ?- CT A/P 4/10 w/ high-grade small bowel obstruction w/ diameter of the small intestine up to 6.5 cm. No free air.  ?- Suspect post op SBO. Continue conservative management. No indication for emergency surgery. Usually try to wait these out till ~6 weeks post op to see if they improve on their own.  ?- repeat CT yesterday due to increased pain showed persistent SBO with some improvement from comparison CT ?- feeling better today and passing some flatus. Trial clear liquids again ?-miralax  ?-cont to mobilize and pulm toilet ?-schedule simethicone  ?- Continue TPN (pre-alb 17.8, albumin 2.9)  ?Tachy/HTN - Scheduled IV lopressor 7.5 mg q6h. PRN hydralazine. improved ?Hyponatremia - resolved ?FEN -CLD, ensure, TPN ?VTE - SCDs, Lovenox (refusing) ?ID - None  ?Foley - None, monitor I/O, voiding ?Dispo - Med-surg, await bowel function ? ? ? LOS: 19 days  ? ? ?Franne Forts, PA-C ?[facility] ?08/18/2021, 11:43 AM ?Please see Amion for pager number during day hours 7:00am-4:30pm ? ?

## 2021-08-19 MED ORDER — KETOROLAC TROMETHAMINE 15 MG/ML IJ SOLN
15.0000 mg | Freq: Four times a day (QID) | INTRAMUSCULAR | Status: AC | PRN
  Administered 2021-08-19 – 2021-08-23 (×6): 15 mg via INTRAVENOUS
  Filled 2021-08-19 (×7): qty 1

## 2021-08-19 MED ORDER — METHOCARBAMOL 1000 MG/10ML IJ SOLN
1000.0000 mg | Freq: Three times a day (TID) | INTRAMUSCULAR | Status: DC
  Filled 2021-08-19 (×2): qty 10

## 2021-08-19 MED ORDER — METHOCARBAMOL 1000 MG/10ML IJ SOLN
1000.0000 mg | Freq: Three times a day (TID) | INTRAVENOUS | Status: DC
  Administered 2021-08-19 – 2021-08-22 (×9): 1000 mg via INTRAVENOUS
  Filled 2021-08-19 (×5): qty 10
  Filled 2021-08-19: qty 1000
  Filled 2021-08-19 (×4): qty 10
  Filled 2021-08-19: qty 1000

## 2021-08-19 MED ORDER — TRAVASOL 10 % IV SOLN
INTRAVENOUS | Status: AC
  Filled 2021-08-19: qty 1248

## 2021-08-19 MED ORDER — PANTOPRAZOLE 2 MG/ML SUSPENSION
40.0000 mg | Freq: Every day | ORAL | Status: DC
Start: 2021-08-20 — End: 2021-08-26
  Administered 2021-08-21 – 2021-08-26 (×5): 40 mg via ORAL
  Filled 2021-08-19 (×7): qty 20

## 2021-08-19 MED ORDER — ACETAMINOPHEN 160 MG/5ML PO SOLN
1000.0000 mg | Freq: Four times a day (QID) | ORAL | Status: DC
  Administered 2021-08-19: 650 mg via ORAL
  Administered 2021-08-20 – 2021-08-22 (×8): 1000 mg via ORAL
  Filled 2021-08-19 (×9): qty 40.6
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
## 2021-08-19 NOTE — Progress Notes (Signed)
[facility] ?Progress Note ? ?   ?Subjective: ?CC-  ?Complaining of some gas pains. Denies n/v. Passing some flatus, no BM. Tolerating clear liquids. ? ?Objective: ?Vital signs in last 24 hours: ?Temp:  [98.1 ?F (36.7 ?C)-99.1 ?F (37.3 ?C)] 98.1 ?F (36.7 ?C) (04/30 0559) ?Pulse Rate:  [86-95] 87 (04/30 0559) ?Resp:  [16-17] 17 (04/30 0559) ?BP: (126-132)/(91-96) 126/91 (04/30 0559) ?SpO2:  [100 %] 100 % (04/30 0559) ?Last BM Date : 08/13/21 ? ?Intake/Output from previous day: ?04/29 0701 - 04/30 0700 ?In: 988.2 [I.V.:988.2] ?Out: -  ?Intake/Output this shift: ?No intake/output data recorded. ? ?PE: ?Gen: NAD ?Heart: regular ?Lungs: respiratory effort nonlabored ?Abd: soft. Minimal distension. Few BS heard, nontender ? ?Lab Results:  ?Recent Labs  ?  08/17/21 ?1115  ?WBC 5.0  ?HGB 10.0*  ?HCT 30.3*  ?PLT 349  ? ?BMET ?No results for input(s): NA, K, CL, CO2, GLUCOSE, BUN, CREATININE, CALCIUM in the last 72 hours. ?PT/INR ?No results for input(s): LABPROT, INR in the last 72 hours. ?CMP  ?   ?Component Value Date/Time  ? NA 136 08/16/2021 0322  ? NA 138 02/09/2021 1036  ? K 4.0 08/16/2021 0322  ? CL 105 08/16/2021 0322  ? CO2 26 08/16/2021 0322  ? GLUCOSE 98 08/16/2021 0322  ? BUN 15 08/16/2021 0322  ? BUN 11 02/09/2021 1036  ? CREATININE 0.70 08/16/2021 0322  ? CALCIUM 8.7 (L) 08/16/2021 0322  ? PROT 6.3 (L) 08/16/2021 0322  ? ALBUMIN 2.7 (L) 08/16/2021 0322  ? AST 18 08/16/2021 0322  ? ALT 31 08/16/2021 0322  ? ALKPHOS 57 08/16/2021 0322  ? BILITOT 0.2 (L) 08/16/2021 0322  ? GFRNONAA >60 08/16/2021 0322  ? GFRAA >90 12/12/2012 0526  ? ?Lipase  ?   ?Component Value Date/Time  ? LIPASE 48 07/30/2021 1358  ? ? ? ? ? ?Studies/Results: ?CT ABDOMEN PELVIS W CONTRAST ? ?Result Date: 08/17/2021 ?CLINICAL DATA:  Postop colostomy reversal. Small bowel obstruction. Abdominal pain. EXAM: CT ABDOMEN AND PELVIS WITH CONTRAST TECHNIQUE: Multidetector CT imaging of the abdomen and pelvis was performed using the standard  protocol following bolus administration of intravenous contrast. RADIATION DOSE REDUCTION: This exam was performed according to the departmental dose-optimization program which includes automated exposure control, adjustment of the mA and/or kV according to patient size and/or use of iterative reconstruction technique. CONTRAST:  149mL OMNIPAQUE IOHEXOL 300 MG/ML  SOLN COMPARISON:  Postoperative CT 07/30/2021 FINDINGS: Lower chest: Lung bases are clear. Hepatobiliary: No focal hepatic lesion. Mild periportal edema. Gallbladder normal. Pancreas: Pancreas is normal. No ductal dilatation. No pancreatic inflammation. Spleen: Normal spleen Adrenals/urinary tract: Adrenal glands normal. Cortical scarring of the RIGHT kidney. Bilateral mild hydroureter. No obstructing lesion identified. Bladder mildly distended. Stomach/Bowel: Stomach is fluid-filled. There is dilatation of the proximal and mid small bowel. Air-fluid levels within the dilated small bowel. The small bowel measures 4.6 cm in diameter (image 43/3) compared to 6.3 on comparison exam at similar level. More distally the small bowel measures 3.7 cm (image 60/3) compared to 4.8. There is less dilated small bowel within the pelvis compared to prior. There is no pneumatosis or portal venous gas. No intraperitoneal free air. The colon is collapsed distally. There formed stool within the proximal colon in the vicinity of the anastomosis Centrally within the peritoneal space there is a rounded collection measuring 3.5 x 5.0 cm (image 46/3) which has central soft tissue attenuation. Collection has appearance of a loop of small bowel cannot confidently connected no other  loops of bowel. Findings similar to comparison exam. Vascular/Lymphatic: Abdominal aorta is normal caliber. No periportal or retroperitoneal adenopathy. No pelvic adenopathy. Reproductive: Unremarkable Other: No free fluid. Musculoskeletal: Unremarkable IMPRESSION: 1. Persistent high-grade small bowel  obstruction pattern with some improvement from comparison exam 07/30/2021. 2. No portal venous gas, intraperitoneal free air, or small bowel pneumatosis. 3. Unusual loop of isolated small bowel centrally within the peritoneal space could represent site of obstruction. Electronically Signed   By: Suzy Bouchard M.D.   On: 08/17/2021 14:00  ? ?DG CHEST PORT 1 VIEW ? ?Result Date: 08/17/2021 ?CLINICAL DATA:  Shortness of breath EXAM: PORTABLE CHEST 1 VIEW COMPARISON:  Chest x-ray dated August 02, 2020 FINDINGS: Unchanged position right arm PICC. Interval removal of enteric tube. Cardiac and mediastinal contours are within normal limits. Lungs are clear. No pleural effusion pneumothorax. IMPRESSION: No active disease. Electronically Signed   By: Yetta Glassman M.D.   On: 08/17/2021 11:15  ? ?DG Abd Portable 1V ? ?Result Date: 08/17/2021 ?CLINICAL DATA:  Nausea. Emesis. Shortness of breath. Small bowel obstruction. EXAM: PORTABLE ABDOMEN - 1 VIEW COMPARISON:  08/07/2021; CT abdomen pelvis-07/30/2021 FINDINGS: Interval removal of enteric tube. Overall decreased gaseous distension of the small bowel however there is persistent moderate gaseous distension of several scattered loops with index loop within the left upper abdominal quadrant measuring 5.4 cm in diameter. This finding is associated with a conspicuous paucity of distal colonic gas. Nondiagnostic evaluation for pneumoperitoneum secondary to supine positioning and exclusion of the lower thorax. No definite pneumatosis or portal venous gas. No definite acute osseous abnormalities. Old/healed right eleventh rib fracture. IMPRESSION: 1. Findings suggestive of slightly improved though persistent small-bowel obstruction. 2. Interval removal of enteric tube. Electronically Signed   By: Sandi Mariscal M.D.   On: 08/17/2021 11:18   ? ?Anti-infectives: ?Anti-infectives (From admission, onward)  ? ? None  ? ?  ? ? ? ?Assessment/Plan ?POD #40, s/p colostomy takedown w/ 30 min  LOA by Dr. Grandville Silos on 07/10/21.  ?SBO vs Ileus  ?- CT A/P 4/10 w/ high-grade small bowel obstruction w/ diameter of the small intestine up to 6.5 cm. No free air.  ?- Suspect post op SBO. Continue conservative management. No indication for emergency surgery. Usually try to wait these out till ~6 weeks post op to see if they improve on their own.  ?- repeat CT 4/28 due to increased pain showed persistent SBO with some improvement from comparison CT ?- Pain overall improved but not resolved, passing some flatus. Continue clear liquids today, may attempt to advance again tomorrow if still doing well. Mobilize. Continue TPN. ? ?Tachy/HTN - Scheduled IV lopressor 7.5 mg q6h. PRN hydralazine. improved ?Hyponatremia - resolved (4/27) ?FEN -CLD, ensure, TPN ?VTE - SCDs, Lovenox (refusing) ?ID - None  ?Foley - None, monitor I/O, voiding ?Dispo - Med-surg, await bowel function ? ? ? LOS: 20 days  ? ? ?Wellington Hampshire, PA-C ?[facility] ?08/19/2021, 8:32 AM ?Please see Amion for pager number during day hours 7:00am-4:30pm ? ?

## 2021-08-19 NOTE — Progress Notes (Signed)
Mobility Specialist Progress Note  ? ? 08/19/21 1239  ?Mobility  ?Activity Ambulated independently in hallway  ?Level of Assistance Independent  ?Assistive Device None  ?Distance Ambulated (ft) 800 ft  ?Activity Response Tolerated well  ?$Mobility charge 1 Mobility  ? ?Pt received in bed and agreeable. No complaints on walk. Returned to bed with call bell in reach.   ? ? [place] ?Mobility Specialist  ?Primary: 5N M.S. Phone: [phone] ?Secondary: 6N M.S. Phone: [phone] ?  ?

## 2021-08-20 LAB — COMPREHENSIVE METABOLIC PANEL
ALT: 21 U/L (ref 0–44)
AST: 16 U/L (ref 15–41)
Albumin: 2.9 g/dL — ABNORMAL LOW (ref 3.5–5.0)
Alkaline Phosphatase: 58 U/L (ref 38–126)
Anion gap: 7 (ref 5–15)
BUN: 16 mg/dL (ref 6–20)
CO2: 22 mmol/L (ref 22–32)
Calcium: 8.6 mg/dL — ABNORMAL LOW (ref 8.9–10.3)
Chloride: 106 mmol/L (ref 98–111)
Creatinine, Ser: 0.67 mg/dL (ref 0.61–1.24)
GFR, Estimated: >60 mL/min (ref >60)
Glucose, Bld: 91 mg/dL (ref 70–99)
Potassium: 4.1 mmol/L (ref 3.5–5.1)
Sodium: 135 mmol/L (ref 135–145)
Total Bilirubin: 0.3 mg/dL (ref 0.3–1.2)
Total Protein: 6.2 g/dL — ABNORMAL LOW (ref 6.5–8.1)

## 2021-08-20 LAB — PHOSPHORUS: Phosphorus: 3.9 mg/dL (ref 2.5–4.6)

## 2021-08-20 LAB — TRIGLYCERIDES: Triglycerides: 51 mg/dL (ref <150)

## 2021-08-20 LAB — MAGNESIUM: Magnesium: 1.8 mg/dL (ref 1.7–2.4)

## 2021-08-20 MED ORDER — BOOST / RESOURCE BREEZE PO LIQD CUSTOM
1.0000 | Freq: Three times a day (TID) | ORAL | Status: DC
  Administered 2021-08-21 – 2021-08-22 (×4): 1 via ORAL

## 2021-08-20 MED ORDER — TRAVASOL 10 % IV SOLN
INTRAVENOUS | Status: AC
  Filled 2021-08-20: qty 1248

## 2021-08-20 NOTE — Progress Notes (Signed)
Mobility Specialist Progress Note: ? ? 08/20/21 1038  ?Mobility  ?Activity Ambulated independently in hallway  ?Level of Assistance Independent  ?Assistive Device None  ?Distance Ambulated (ft) 570 ft  ?Activity Response Tolerated well  ?$Mobility charge 1 Mobility  ? ?Pt ambulating independently in hall. No complaints.  ? ?Alejandro Shelton ?Mobility Specialist ?Primary Phone [phone] ? ?
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
## 2021-08-20 NOTE — Progress Notes (Signed)
[facility] ?Progress Note ? ?   ?Subjective: ?No new complaints today.  Tolerating CLd, feels less bloated ? ?Objective: ?Vital signs in last 24 hours: ?Temp:  [98.4 ?F (36.9 ?C)-99 ?F (37.2 ?C)] 98.5 ?F (36.9 ?C) (05/01 0435) ?Pulse Rate:  [80-86] 80 (05/01 0435) ?Resp:  [17-18] 18 (05/01 0435) ?BP: (109-129)/(67-87) 129/87 (05/01 0435) ?SpO2:  [100 %] 100 % (05/01 0435) ?Last BM Date : 08/13/21 ? ?Intake/Output from previous day: ?04/30 0701 - 05/01 0700 ?In: 880 [I.V.:880] ?Out: -  ?Intake/Output this shift: ?No intake/output data recorded. ? ?PE: ?Gen: NAD ?Heart: regular ?Lungs: respiratory effort nonlabored ?Abd: soft. Minimal distension. Few BS heard, nontender ? ?Lab Results:  ?Recent Labs  ?  08/17/21 ?1115  ?WBC 5.0  ?HGB 10.0*  ?HCT 30.3*  ?PLT 349  ? ?BMET ?Recent Labs  ?  08/20/21 ?0330  ?NA 135  ?K 4.1  ?CL 106  ?CO2 22  ?GLUCOSE 91  ?BUN 16  ?CREATININE 0.67  ?CALCIUM 8.6*  ? ?PT/INR ?No results for input(s): LABPROT, INR in the last 72 hours. ?CMP  ?   ?Component Value Date/Time  ? NA 135 08/20/2021 0330  ? NA 138 02/09/2021 1036  ? K 4.1 08/20/2021 0330  ? CL 106 08/20/2021 0330  ? CO2 22 08/20/2021 0330  ? GLUCOSE 91 08/20/2021 0330  ? BUN 16 08/20/2021 0330  ? BUN 11 02/09/2021 1036  ? CREATININE 0.67 08/20/2021 0330  ? CALCIUM 8.6 (L) 08/20/2021 0330  ? PROT 6.2 (L) 08/20/2021 0330  ? ALBUMIN 2.9 (L) 08/20/2021 0330  ? AST 16 08/20/2021 0330  ? ALT 21 08/20/2021 0330  ? ALKPHOS 58 08/20/2021 0330  ? BILITOT 0.3 08/20/2021 0330  ? GFRNONAA >60 08/20/2021 0330  ? GFRAA >90 12/12/2012 0526  ? ?Lipase  ?   ?Component Value Date/Time  ? LIPASE 48 07/30/2021 1358  ? ? ? ? ? ?Studies/Results: ?No results found. ? ?Anti-infectives: ?Anti-infectives (From admission, onward)  ? ? None  ? ?  ? ? ? ?Assessment/Plan ?POD #41, s/p colostomy takedown w/ 30 min LOA by Dr. Grandville Silos on 07/10/21.  ?SBO vs Ileus  ?- CT A/P 4/10 w/ high-grade small bowel obstruction w/ diameter of the small intestine up  to 6.5 cm. No free air.  ?- Suspect post op SBO. Continue conservative management. No indication for emergency surgery. Usually try to wait these out till ~6 weeks post op to see if they improve on their own.  ?- repeat CT 4/28 due to increased pain showed persistent SBO with some improvement from comparison CT ?- Pain overall improved but not resolved, passing some flatus. Continue clear liquids today.  Mobilize. Continue TPN. ?Tachy/HTN - Scheduled IV lopressor 7.5 mg q6h. PRN hydralazine. improved ?Hyponatremia - resolved (4/27) ?FEN -CLD, ensure, TPN ?VTE - SCDs, Lovenox (refusing) ?ID - None  ?Foley - None, monitor I/O, voiding ?Dispo - Med-surg, await bowel function ? ? LOS: 21 days  ? ? ?Henreitta Cea, PA-C ?[facility] ?08/20/2021, 8:23 AM ?Please see Amion for pager number during day hours 7:00am-4:30pm ? ?

## 2021-08-20 NOTE — Progress Notes (Signed)
Pt reported having a bowel movement this evening ?

## 2021-08-20 NOTE — Progress Notes (Signed)
Pt continues with persistent abdominal pain, with periods of brief relief with pain medications given throughout the shift. Abdomen remains distended with hypoactive bowel sounds. Pain noted mostly 7-8/10 consistently. Ambulating in hallway at times. No bowel movement noted this shift. No reports of nausea, continues on clear liquid diet. Continues to refuse tylenol and lovenox, despite education on indications. Vital signs stable, no distress at this time, resting quietly.  ?

## 2021-08-21 LAB — BASIC METABOLIC PANEL
Anion gap: 9 (ref 5–15)
BUN: 14 mg/dL (ref 6–20)
CO2: 22 mmol/L (ref 22–32)
Calcium: 8.7 mg/dL — ABNORMAL LOW (ref 8.9–10.3)
Chloride: 106 mmol/L (ref 98–111)
Creatinine, Ser: 0.7 mg/dL (ref 0.61–1.24)
GFR, Estimated: >60 mL/min (ref >60)
Glucose, Bld: 101 mg/dL — ABNORMAL HIGH (ref 70–99)
Potassium: 4 mmol/L (ref 3.5–5.1)
Sodium: 137 mmol/L (ref 135–145)

## 2021-08-21 LAB — MAGNESIUM: Magnesium: 1.8 mg/dL (ref 1.7–2.4)

## 2021-08-21 MED ORDER — MAGNESIUM SULFATE IN D5W 1-5 GM/100ML-% IV SOLN
1.0000 g | Freq: Once | INTRAVENOUS | Status: AC
  Administered 2021-08-21: 1 g via INTRAVENOUS
  Filled 2021-08-21: qty 100

## 2021-08-21 MED ORDER — TRAVASOL 10 % IV SOLN
INTRAVENOUS | Status: AC
  Filled 2021-08-21: qty 1248

## 2021-08-21 NOTE — Progress Notes (Signed)
[facility] ?Progress Note ? ?   ?Subjective: ?Feeling much better today.  Passing a lot of flatus and has 2 BMs, once last night and one this morning.  Wants a pack of crackers with his broth ? ?Objective: ?Vital signs in last 24 hours: ?Temp:  [98.5 ?F (36.9 ?C)-98.7 ?F (37.1 ?C)] 98.5 ?F (36.9 ?C) (05/02 5993) ?Pulse Rate:  [77-93] 93 (05/02 0817) ?Resp:  [16-18] 16 (05/02 0817) ?BP: (105-126)/(75-93) 126/93 (05/02 5701) ?SpO2:  [99 %-100 %] 100 % (05/02 0817) ?Weight:  [69.9 kg] 69.9 kg (05/01 1500) ?Last BM Date : 08/21/21 ? ?Intake/Output from previous day: ?05/01 0701 - 05/02 0700 ?In: 3003.6 [P.O.:195; I.V.:2608.6; IV Piggyback:200] ?Out: 1 [Stool:1] ?Intake/Output this shift: ?No intake/output data recorded. ? ?PE: ?Gen: NAD ?Heart: regular ?Lungs: respiratory effort nonlabored ?Abd: soft. Minimal distension, he is the softest today that I have seen him thus far. Few BS heard, nontender ? ?Lab Results:  ?No results for input(s): WBC, HGB, HCT, PLT in the last 72 hours. ? ?BMET ?Recent Labs  ?  08/20/21 ?0330 08/21/21 ?0243  ?NA 135 137  ?K 4.1 4.0  ?CL 106 106  ?CO2 22 22  ?GLUCOSE 91 101*  ?BUN 16 14  ?CREATININE 0.67 0.70  ?CALCIUM 8.6* 8.7*  ? ?PT/INR ?No results for input(s): LABPROT, INR in the last 72 hours. ?CMP  ?   ?Component Value Date/Time  ? NA 137 08/21/2021 0243  ? NA 138 02/09/2021 1036  ? K 4.0 08/21/2021 0243  ? CL 106 08/21/2021 0243  ? CO2 22 08/21/2021 0243  ? GLUCOSE 101 (H) 08/21/2021 0243  ? BUN 14 08/21/2021 0243  ? BUN 11 02/09/2021 1036  ? CREATININE 0.70 08/21/2021 0243  ? CALCIUM 8.7 (L) 08/21/2021 0243  ? PROT 6.2 (L) 08/20/2021 0330  ? ALBUMIN 2.9 (L) 08/20/2021 0330  ? AST 16 08/20/2021 0330  ? ALT 21 08/20/2021 0330  ? ALKPHOS 58 08/20/2021 0330  ? BILITOT 0.3 08/20/2021 0330  ? GFRNONAA >60 08/21/2021 0243  ? GFRAA >90 12/12/2012 0526  ? ?Lipase  ?   ?Component Value Date/Time  ? LIPASE 48 07/30/2021 1358  ? ? ? ? ? ?Studies/Results: ?No results  found. ? ?Anti-infectives: ?Anti-infectives (From admission, onward)  ? ? None  ? ?  ? ? ? ?Assessment/Plan ?POD #42, s/p colostomy takedown w/ 30 min LOA by Dr. Janee Morn on 07/10/21.  ?SBO vs Ileus  ?- CT A/P 4/10 w/ high-grade small bowel obstruction w/ diameter of the small intestine up to 6.5 cm. No free air.  ?- Suspect post op SBO. Continue conservative management. No indication for emergency surgery. Usually try to wait these out till ~6 weeks post op to see if they improve on their own.  ?- repeat CT 4/28 due to increased pain showed persistent SBO with some improvement from comparison CT ?- Pain overall improved but not resolved, passing flatus and had 2 BMs. Continue clear liquids today as he is improving.  Possible FLD tomorrow if continues to do well.  Mobilize. Continue TPN. ?Tachy/HTN - Scheduled IV lopressor 7.5 mg q6h. PRN hydralazine. improved ?Hyponatremia - resolved (4/27) ?FEN -CLD, ensure, TPN ?VTE - SCDs, Lovenox (refusing) ?ID - None  ?Foley - None, monitor I/O, voiding ?Dispo - Med-surg, await bowel function ? ? LOS: 22 days  ? ? ?Letha Cape, PA-C ?[facility] ?08/21/2021, 9:08 AM ?Please see Amion for pager number during day hours 7:00am-4:30pm ? ?
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
## 2021-08-21 NOTE — Progress Notes (Signed)
Mobility Specialist Progress Note: ? ? 08/21/21 1454  ?Mobility  ?Activity Ambulated with assistance in hallway  ?Level of Assistance Modified independent, requires aide device or extra time  ?Assistive Device None  ?Distance Ambulated (ft) 1500 ft  ?Activity Response Tolerated well  ?$Mobility charge 1 Mobility  ? ?Pt received walking in hall. Complaints of a little stomach tightness. Left in room with all needs met. ? ?Soma Lizak ?Mobility Specialist ?Primary Phone [phone] ? ?
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
## 2021-08-22 MED ORDER — TRAVASOL 10 % IV SOLN
INTRAVENOUS | Status: AC
  Filled 2021-08-22: qty 1248

## 2021-08-22 MED ORDER — ACETAMINOPHEN 500 MG PO TABS
1000.0000 mg | ORAL_TABLET | Freq: Four times a day (QID) | ORAL | Status: DC
Start: 2021-08-22 — End: 2021-08-26
  Administered 2021-08-23: 1000 mg via ORAL
  Filled 2021-08-22 (×9): qty 2

## 2021-08-22 MED ORDER — METHOCARBAMOL 500 MG PO TABS
1000.0000 mg | ORAL_TABLET | Freq: Three times a day (TID) | ORAL | Status: DC | PRN
  Administered 2021-08-26: 1000 mg via ORAL
  Filled 2021-08-22: qty 2

## 2021-08-22 MED ORDER — HYDROMORPHONE HCL 1 MG/ML IJ SOLN
0.5000 mg | Freq: Four times a day (QID) | INTRAMUSCULAR | Status: DC | PRN
Start: 2021-08-22 — End: 2021-08-26
  Administered 2021-08-22 – 2021-08-26 (×14): 0.5 mg via INTRAVENOUS
  Filled 2021-08-22 (×15): qty 0.5
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
## 2021-08-22 NOTE — Progress Notes (Signed)
[facility] ?Progress Note ? ?   ?Subjective: ?Says  he did well with his liquids yesterday.  No further BM, but refuses daily miralax, still with flatus. ? ?Objective: ?Vital signs in last 24 hours: ?Temp:  [98.2 ?F (36.8 ?C)-98.5 ?F (36.9 ?C)] 98.3 ?F (36.8 ?C) (05/03 0444) ?Pulse Rate:  [86-91] 91 (05/03 0444) ?Resp:  [16-19] 19 (05/03 0444) ?BP: (128-131)/(95-96) 131/95 (05/03 0444) ?SpO2:  [100 %] 100 % (05/03 0444) ?Last BM Date : 08/21/21 ? ?Intake/Output from previous day: ?05/02 0701 - 05/03 0700 ?In: 2584.6 [I.V.:2084.6; IV Piggyback:500] ?Out: -  ?Intake/Output this shift: ?No intake/output data recorded. ? ?PE: ?Gen: NAD ?Heart: regular ?Lungs: respiratory effort nonlabored ?Abd: soft. Minimal distension, slightly increased from yesterday. Some BS, nontender to palpation ? ?Lab Results:  ?No results for input(s): WBC, HGB, HCT, PLT in the last 72 hours. ? ?BMET ?Recent Labs  ?  08/20/21 ?0330 08/21/21 ?0243  ?NA 135 137  ?K 4.1 4.0  ?CL 106 106  ?CO2 22 22  ?GLUCOSE 91 101*  ?BUN 16 14  ?CREATININE 0.67 0.70  ?CALCIUM 8.6* 8.7*  ? ?PT/INR ?No results for input(s): LABPROT, INR in the last 72 hours. ?CMP  ?   ?Component Value Date/Time  ? NA 137 08/21/2021 0243  ? NA 138 02/09/2021 1036  ? K 4.0 08/21/2021 0243  ? CL 106 08/21/2021 0243  ? CO2 22 08/21/2021 0243  ? GLUCOSE 101 (H) 08/21/2021 0243  ? BUN 14 08/21/2021 0243  ? BUN 11 02/09/2021 1036  ? CREATININE 0.70 08/21/2021 0243  ? CALCIUM 8.7 (L) 08/21/2021 0243  ? PROT 6.2 (L) 08/20/2021 0330  ? ALBUMIN 2.9 (L) 08/20/2021 0330  ? AST 16 08/20/2021 0330  ? ALT 21 08/20/2021 0330  ? ALKPHOS 58 08/20/2021 0330  ? BILITOT 0.3 08/20/2021 0330  ? GFRNONAA >60 08/21/2021 0243  ? GFRAA >90 12/12/2012 0526  ? ?Lipase  ?   ?Component Value Date/Time  ? LIPASE 48 07/30/2021 1358  ? ? ? ? ? ?Studies/Results: ?No results found. ? ?Anti-infectives: ?Anti-infectives (From admission, onward)  ? ? None  ? ?  ? ? ? ?Assessment/Plan ?POD #43, s/p colostomy  takedown w/ 30 min LOA by Dr. Janee Morn on 07/10/21.  ?SBO vs Ileus  ?- CT A/P 4/10 w/ high-grade small bowel obstruction w/ diameter of the small intestine up to 6.5 cm. No free air.  ?- Suspect post op SBO. Continue conservative management. No indication for emergency surgery. Usually try to wait these out till ~6 weeks post op to see if they improve on their own.  ?- repeat CT 4/28 due to increased pain showed persistent SBO with some improvement from comparison CT ?- will try FLD today and see how he does.   ?-Continue TPN. ?Tachy/HTN - Scheduled IV lopressor 7.5 mg q6h. PRN hydralazine. improved ?Hyponatremia - resolved (4/27) ?FEN -FLD, Breeze, TPN ?VTE - SCDs, Lovenox (refusing) ?ID - None  ?Foley - None, monitor I/O, voiding ?Dispo - Med-surg, await bowel function ? ? LOS: 23 days  ? ? ?Letha Cape, PA-C ?[facility] ?08/22/2021, 9:25 AM ?Please see Amion for pager number during day hours 7:00am-4:30pm ? ?

## 2021-08-22 NOTE — Progress Notes (Signed)
?   08/22/21 1249  ?Mobility  ?Activity Ambulated independently in hallway  ?Level of Assistance Independent  ?Assistive Device None  ?Distance Ambulated (ft) 1500 ft  ?Activity Response Tolerated well  ?$Mobility charge 1 Mobility  ? ?Pt ambulating in hallways with Iv pole. No complaints.  ? ?Alejandro Shelton ?Mobility Specialist ?Primary Phone [phone] ? ?

## 2021-08-23 LAB — COMPREHENSIVE METABOLIC PANEL
ALT: 18 U/L (ref 0–44)
AST: 14 U/L — ABNORMAL LOW (ref 15–41)
Albumin: 3 g/dL — ABNORMAL LOW (ref 3.5–5.0)
Alkaline Phosphatase: 60 U/L (ref 38–126)
Anion gap: 6 (ref 5–15)
BUN: 14 mg/dL (ref 6–20)
CO2: 27 mmol/L (ref 22–32)
Calcium: 9 mg/dL (ref 8.9–10.3)
Chloride: 105 mmol/L (ref 98–111)
Creatinine, Ser: 0.77 mg/dL (ref 0.61–1.24)
GFR, Estimated: >60 mL/min (ref >60)
Glucose, Bld: 97 mg/dL (ref 70–99)
Potassium: 3.9 mmol/L (ref 3.5–5.1)
Sodium: 138 mmol/L (ref 135–145)
Total Bilirubin: 0.4 mg/dL (ref 0.3–1.2)
Total Protein: 6.3 g/dL — ABNORMAL LOW (ref 6.5–8.1)

## 2021-08-23 LAB — MAGNESIUM: Magnesium: 1.9 mg/dL (ref 1.7–2.4)

## 2021-08-23 LAB — PHOSPHORUS: Phosphorus: 4.6 mg/dL (ref 2.5–4.6)

## 2021-08-23 MED ORDER — TRAVASOL 10 % IV SOLN
INTRAVENOUS | Status: DC
  Filled 2021-08-23: qty 1248

## 2021-08-23 MED ORDER — ENSURE ENLIVE PO LIQD
237.0000 mL | ORAL | Status: DC
  Administered 2021-08-23: 237 mL via ORAL
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
## 2021-08-23 NOTE — Progress Notes (Signed)
[facility] ?Progress Note ? ?   ?Subjective: ?Did well with the full liquids.  Had a BM this morning.  No new complaints. ? ?Objective: ?Vital signs in last 24 hours: ?Temp:  [98.1 ?F (36.7 ?C)-98.7 ?F (37.1 ?C)] 98.1 ?F (36.7 ?C) (05/04 0850) ?Pulse Rate:  [88-97] 94 (05/04 0850) ?Resp:  [18-19] 19 (05/04 0850) ?BP: (120-142)/(76-94) 138/76 (05/04 0850) ?SpO2:  [98 %-100 %] 98 % (05/04 0850) ?Last BM Date : 08/20/21 ? ?Intake/Output from previous day: ?05/03 0701 - 05/04 0700 ?In: 1056.2 [I.V.:1056.2] ?Out: -  ?Intake/Output this shift: ?No intake/output data recorded. ? ?PE: ?Gen: NAD ?Heart: regular ?Lungs: respiratory effort nonlabored ?Abd: softer than yesterday, +BS, NT ? ?Lab Results:  ?No results for input(s): WBC, HGB, HCT, PLT in the last 72 hours. ? ?BMET ?Recent Labs  ?  08/21/21 ?0243 08/23/21 ?1194  ?NA 137 138  ?K 4.0 3.9  ?CL 106 105  ?CO2 22 27  ?GLUCOSE 101* 97  ?BUN 14 14  ?CREATININE 0.70 0.77  ?CALCIUM 8.7* 9.0  ? ?PT/INR ?No results for input(s): LABPROT, INR in the last 72 hours. ?CMP  ?   ?Component Value Date/Time  ? NA 138 08/23/2021 0516  ? NA 138 02/09/2021 1036  ? K 3.9 08/23/2021 0516  ? CL 105 08/23/2021 0516  ? CO2 27 08/23/2021 0516  ? GLUCOSE 97 08/23/2021 0516  ? BUN 14 08/23/2021 0516  ? BUN 11 02/09/2021 1036  ? CREATININE 0.77 08/23/2021 0516  ? CALCIUM 9.0 08/23/2021 0516  ? PROT 6.3 (L) 08/23/2021 0516  ? ALBUMIN 3.0 (L) 08/23/2021 0516  ? AST 14 (L) 08/23/2021 0516  ? ALT 18 08/23/2021 0516  ? ALKPHOS 60 08/23/2021 0516  ? BILITOT 0.4 08/23/2021 0516  ? GFRNONAA >60 08/23/2021 0516  ? GFRAA >90 12/12/2012 0526  ? ?Lipase  ?   ?Component Value Date/Time  ? LIPASE 48 07/30/2021 1358  ? ? ? ? ? ?Studies/Results: ?No results found. ? ?Anti-infectives: ?Anti-infectives (From admission, onward)  ? ? None  ? ?  ? ? ? ?Assessment/Plan ?POD #44, s/p colostomy takedown w/ 30 min LOA by Dr. Janee Morn on 07/10/21.  ?SBO vs Ileus  ?- CT A/P 4/10 w/ high-grade small bowel  obstruction w/ diameter of the small intestine up to 6.5 cm. No free air.  ?- Suspect post op SBO. Continue conservative management. No indication for emergency surgery. Usually try to wait these out till ~6 weeks post op to see if they improve on their own.  ?- repeat CT 4/28 due to increased pain showed persistent SBO with some improvement from comparison CT ?- cont FLD and cont to monitor for issues.   ?-Continue TPN. ?Tachy/HTN - Scheduled IV lopressor 7.5 mg q6h. PRN hydralazine. improved ?Hyponatremia - resolved (4/27) ?FEN -FLD, Breeze, TPN ?VTE - SCDs, Lovenox (refusing) ?ID - None  ?Foley - None, monitor I/O, voiding ?Dispo - Med-surg, await bowel function ? ? LOS: 24 days  ? ? ?Letha Cape, PA-C ?[facility] ?08/23/2021, 8:52 AM ?Please see Amion for pager number during day hours 7:00am-4:30pm ? ?

## 2021-08-24 MED ORDER — POTASSIUM CHLORIDE CRYS ER 20 MEQ PO TBCR
40.0000 meq | EXTENDED_RELEASE_TABLET | Freq: Once | ORAL | Status: AC
  Administered 2021-08-24: 40 meq via ORAL
  Filled 2021-08-24: qty 2

## 2021-08-24 MED ORDER — TRAVASOL 10 % IV SOLN
INTRAVENOUS | Status: AC
  Filled 2021-08-24: qty 1248

## 2021-08-24 MED ORDER — TRAVASOL 10 % IV SOLN
INTRAVENOUS | Status: AC
  Filled 2021-08-24: qty 624

## 2021-08-24 MED ORDER — POTASSIUM CHLORIDE CRYS ER 20 MEQ PO TBCR
20.0000 meq | EXTENDED_RELEASE_TABLET | Freq: Once | ORAL | Status: AC
  Administered 2021-08-24: 20 meq via ORAL
  Filled 2021-08-24: qty 1

## 2021-08-24 MED ORDER — MAGNESIUM SULFATE IN D5W 1-5 GM/100ML-% IV SOLN
1.0000 g | Freq: Once | INTRAVENOUS | Status: AC
  Administered 2021-08-24: 1 g via INTRAVENOUS
  Filled 2021-08-24: qty 100

## 2021-08-24 NOTE — Progress Notes (Signed)
[facility] ?Progress Note ? ?   ?Subjective: ?Did well with the full liquids again yesterday.  Understands eating better this time around and is trying to eat smaller meals more frequently, just hard in the hospital.  Had another BM this morning.  Still feels soft. ? ?Objective: ?Vital signs in last 24 hours: ?Temp:  [98.1 ?F (36.7 ?C)-98.8 ?F (37.1 ?C)] 98.8 ?F (37.1 ?C) (05/05 0802) ?Pulse Rate:  [78-106] 106 (05/05 0802) ?Resp:  [14-19] 16 (05/05 0802) ?BP: (113-139)/(73-95) 137/95 (05/05 0802) ?SpO2:  [98 %-100 %] 100 % (05/05 0802) ?Last BM Date : 08/23/21 ? ?Intake/Output from previous day: ?05/04 0701 - 05/05 0700 ?In: 1458.9 [P.O.:240; I.V.:1218.9] ?Out: -  ?Intake/Output this shift: ?No intake/output data recorded. ? ?PE: ?Gen: NAD ?Heart: regular ?Lungs: respiratory effort nonlabored ?Abd: softer, +BS, NT, less distention  ? ?Lab Results:  ?No results for input(s): WBC, HGB, HCT, PLT in the last 72 hours. ? ?BMET ?Recent Labs  ?  08/23/21 ?4259  ?NA 138  ?K 3.9  ?CL 105  ?CO2 27  ?GLUCOSE 97  ?BUN 14  ?CREATININE 0.77  ?CALCIUM 9.0  ? ?PT/INR ?No results for input(s): LABPROT, INR in the last 72 hours. ?CMP  ?   ?Component Value Date/Time  ? NA 138 08/23/2021 0516  ? NA 138 02/09/2021 1036  ? K 3.9 08/23/2021 0516  ? CL 105 08/23/2021 0516  ? CO2 27 08/23/2021 0516  ? GLUCOSE 97 08/23/2021 0516  ? BUN 14 08/23/2021 0516  ? BUN 11 02/09/2021 1036  ? CREATININE 0.77 08/23/2021 0516  ? CALCIUM 9.0 08/23/2021 0516  ? PROT 6.3 (L) 08/23/2021 0516  ? ALBUMIN 3.0 (L) 08/23/2021 0516  ? AST 14 (L) 08/23/2021 0516  ? ALT 18 08/23/2021 0516  ? ALKPHOS 60 08/23/2021 0516  ? BILITOT 0.4 08/23/2021 0516  ? GFRNONAA >60 08/23/2021 0516  ? GFRAA >90 12/12/2012 0526  ? ?Lipase  ?   ?Component Value Date/Time  ? LIPASE 48 07/30/2021 1358  ? ? ? ? ? ?Studies/Results: ?No results found. ? ?Anti-infectives: ?Anti-infectives (From admission, onward)  ? ? None  ? ?  ? ? ? ?Assessment/Plan ?POD #45, s/p colostomy  takedown w/ 30 min LOA by Dr. Janee Morn on 07/10/21.  ?SBO vs Ileus  ?- CT A/P 4/10 w/ high-grade small bowel obstruction w/ diameter of the small intestine up to 6.5 cm. No free air.  ?- Suspect post op SBO. Continue conservative management. No indication for emergency surgery. Usually try to wait these out till ~6 weeks post op to see if they improve on their own.  ?- repeat CT 4/28 due to increased pain showed persistent SBO with some improvement from comparison CT ?- adv to D2 diet to give him some additional options to eat while still being relatively soft. ?-cont Ensure per dietitian ?-half rate TNA today to 50% of needs and continue to work on wean as able pending diet tolerance. ?Tachy/HTN - Scheduled IV lopressor 7.5 mg q6h. PRN hydralazine. improved ?Hyponatremia - resolved (4/27) ?FEN -D2/Ensure, 1/2 rate TPN ?VTE - SCDs, Lovenox (refusing) ?ID - None  ?Foley - None, monitor I/O, voiding ?Dispo - Med-surg, diet advancement and TNA weaning ? ? LOS: 25 days  ? ? ?Letha Cape, PA-C ?[facility] ?08/24/2021, 8:16 AM ?Please see Amion for pager number during day hours 7:00am-4:30pm ? ?

## 2021-08-24 NOTE — Progress Notes (Signed)
Mobility Specialist Progress Note: ? ? 08/24/21 1035  ?Mobility  ?Activity Ambulated independently in hallway  ?Level of Assistance Independent  ?Assistive Device None  ?Distance Ambulated (ft) 1000 ft  ?Activity Response Tolerated well  ?$Mobility charge 1 Mobility  ? ?Pt walking in hall with IV pole.  ? ?Alejandro Shelton ?Mobility Specialist ?Primary Phone [phone] ? ?
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
## 2021-08-25 LAB — BASIC METABOLIC PANEL
Anion gap: 8 (ref 5–15)
BUN: 14 mg/dL (ref 6–20)
CO2: 25 mmol/L (ref 22–32)
Calcium: 8.9 mg/dL (ref 8.9–10.3)
Chloride: 103 mmol/L (ref 98–111)
Creatinine, Ser: 0.84 mg/dL (ref 0.61–1.24)
GFR, Estimated: >60 mL/min (ref >60)
Glucose, Bld: 99 mg/dL (ref 70–99)
Potassium: 4 mmol/L (ref 3.5–5.1)
Sodium: 136 mmol/L (ref 135–145)

## 2021-08-25 LAB — MAGNESIUM: Magnesium: 1.8 mg/dL (ref 1.7–2.4)

## 2021-08-25 LAB — PHOSPHORUS: Phosphorus: 5 mg/dL — ABNORMAL HIGH (ref 2.5–4.6)

## 2021-08-25 MED ORDER — ADULT MULTIVITAMIN W/MINERALS CH
1.0000 | ORAL_TABLET | Freq: Every day | ORAL | Status: DC
  Administered 2021-08-26: 1 via ORAL
  Filled 2021-08-25: qty 1

## 2021-08-25 NOTE — Progress Notes (Signed)
?   ? ?  Chief Complaint/Subjective: ?+BM, tolerating dys 2 diet, no abdominal pain ? ?Objective: ?Vital signs in last 24 hours: ?Temp:  [97.8 ?F (36.6 ?C)-98.5 ?F (36.9 ?C)] 98.1 ?F (36.7 ?C) (05/06 0831) ?Pulse Rate:  [85-100] 88 (05/06 0831) ?Resp:  [16-17] 17 (05/06 0831) ?BP: (109-135)/(79-98) 122/98 (05/06 0831) ?SpO2:  [99 %-100 %] 100 % (05/06 0831) ?Last BM Date : 08/24/21 ?Intake/Output from previous day: ?05/05 0701 - 05/06 0700 ?In: 1340 [P.O.:780; I.V.:560] ?Out: -  ?Intake/Output this shift: ?No intake/output data recorded. ? ?PE: ?Gen: NAD ?Resp: nonlabored ?Card: RRR ?Abd: soft, incision healed, ostomy site healed ? ?Lab Results:  ?No results for input(s): WBC, HGB, HCT, PLT in the last 72 hours. ?BMET ?Recent Labs  ?  08/23/21 ?0102 08/25/21 ?7253  ?NA 138 136  ?K 3.9 4.0  ?CL 105 103  ?CO2 27 25  ?GLUCOSE 97 99  ?BUN 14 14  ?CREATININE 0.77 0.84  ?CALCIUM 9.0 8.9  ? ?PT/INR ?No results for input(s): LABPROT, INR in the last 72 hours. ?CMP  ?   ?Component Value Date/Time  ? NA 136 08/25/2021 0313  ? NA 138 02/09/2021 1036  ? K 4.0 08/25/2021 0313  ? CL 103 08/25/2021 0313  ? CO2 25 08/25/2021 0313  ? GLUCOSE 99 08/25/2021 0313  ? BUN 14 08/25/2021 0313  ? BUN 11 02/09/2021 1036  ? CREATININE 0.84 08/25/2021 0313  ? CALCIUM 8.9 08/25/2021 0313  ? PROT 6.3 (L) 08/23/2021 0516  ? ALBUMIN 3.0 (L) 08/23/2021 0516  ? AST 14 (L) 08/23/2021 0516  ? ALT 18 08/23/2021 0516  ? ALKPHOS 60 08/23/2021 0516  ? BILITOT 0.4 08/23/2021 0516  ? GFRNONAA >60 08/25/2021 0313  ? GFRAA >90 12/12/2012 0526  ? ?Lipase  ?   ?Component Value Date/Time  ? LIPASE 48 07/30/2021 1358  ? ? ?Studies/Results: ?No results found. ? ?Anti-infectives: ?Anti-infectives (From admission, onward)  ? ? None  ? ?  ? ? ?Assessment/Plan ?SBO after colostomy take down ?FEN - soft diet ?VTE - lovenox ?ID - no issues ?Disposition - home soon, stop TPN ? ? LOS: 26 days  ? ?I reviewed last 24 h vitals and pain scores, last 48 h intake and output,  last 24 h labs and trends, and last 24 h imaging results. ? ?This care required moderate level of medical decision making.  ? ?De Blanch Kalianna Verbeke ?[facility] ?08/25/2021, 9:01 AM ?Please see Amion for pager number during day hours 7:00am-4:30pm or 7:00am -11:30am on weekends ? ? ?
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
## 2021-08-26 MED ORDER — OXYCODONE HCL 5 MG PO TABS: 5.0000 mg | ORAL_TABLET | ORAL | 0 refills | Status: AC | PRN

## 2021-08-26 MED ORDER — MILK OF MAGNESIA 7.75 % PO SUSP: 15.0000 mL | Freq: Every day | ORAL | 0 refills | Status: DC | PRN

## 2021-08-26 NOTE — Plan of Care (Signed)
  Problem: Nutrition: Goal: Adequate nutrition will be maintained Outcome: Progressing   Problem: Pain Managment: Goal: General experience of comfort will improve Outcome: Progressing   Problem: Safety: Goal: Ability to remain free from injury will improve Outcome: Progressing   

## 2021-08-26 NOTE — Progress Notes (Signed)
PICC line removed per order.  No signs of infection noted.  Site cleaned with CHG, covered with vaseline gauze and dry 2x2.  Verbalizes signs of infection and bleeding and interventions if needed.  Aware to remain in bed until 1100. ?
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
## 2021-08-26 NOTE — Progress Notes (Signed)
Pt PICC line removed. Pt has all belongings and understands d/c instructions. Pt refused wheelchair for d/c and wanted to walk himself out. Pt left the unit after receiving d/c paperwork, walk out with his family member.  ?

## 2021-08-26 NOTE — TOC Transition Note (Signed)
Transition of Care (TOC) - CM/SW Discharge Note ? ? ?Patient Details  ?Name: [name] ?MRN: [id number] ?Date of Birth: [birth year] ? ?Transition of Care (TOC) CM/SW Contact:  ?Bess Kinds, RN ?Phone Number: [phone] ?08/26/2021, 9:44 AM ? ? ?Clinical Narrative:    ? ?Patient to transition home today. No TOC needs identified at this time. ? ?Final next level of care: Home/Self Care ?Barriers to Discharge: No Barriers Identified ? ? ?Patient Goals and CMS Choice ?  ?  ?  ? ?Discharge Placement ?  ?           ?  ?  ?  ?  ? ?Discharge Plan and Services ?  ?  ?           ?  ?  ?  ?  ?  ?  ?  ?  ?  ?  ? ?Social Determinants of Health (SDOH) Interventions ?  ? ? ?Readmission Risk Interventions ? ?  08/31/2020  ?  3:56 PM  ?Readmission Risk Prevention Plan  ?Post Dischage Appt   ?Medication Screening   ?Transportation Screening   ?  ? Information is confidential and restricted. Go to Review Flowsheets to unlock data.  ? ? ? ? ? ?

## 2021-09-25 ENCOUNTER — Encounter: Payer: Managed Care, Other (non HMO) | Admitting: Student

## 2021-09-26 ENCOUNTER — Encounter: Payer: Self-pay | Admitting: Student

## 2021-10-26 ENCOUNTER — Encounter (HOSPITAL_COMMUNITY): Payer: Self-pay | Admitting: Nurse Practitioner

## 2021-11-02 ENCOUNTER — Encounter (HOSPITAL_COMMUNITY): Payer: Self-pay | Admitting: Nurse Practitioner

## 2021-11-13 ENCOUNTER — Encounter (HOSPITAL_COMMUNITY): Payer: Self-pay | Admitting: Nurse Practitioner

## 2022-09-15 ENCOUNTER — Encounter: Payer: Self-pay | Admitting: *Deleted

## 2022-09-18 ENCOUNTER — Encounter (HOSPITAL_COMMUNITY): Payer: Self-pay | Admitting: Emergency Medicine

## 2022-09-18 ENCOUNTER — Ambulatory Visit (HOSPITAL_COMMUNITY)
Admission: EM | Admit: 2022-09-18 | Discharge: 2022-09-18 | Disposition: A | Discharge status: Home / Self Care | Payer: Commercial Managed Care - HMO | Attending: Nurse Practitioner | Admitting: Nurse Practitioner

## 2022-09-18 DIAGNOSIS — R369 Urethral discharge, unspecified: Secondary | ICD-10-CM | POA: Diagnosis present

## 2022-09-18 DIAGNOSIS — Z202 Contact with and (suspected) exposure to infections with a predominantly sexual mode of transmission: Secondary | ICD-10-CM | POA: Insufficient documentation
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
## 2022-09-18 NOTE — ED Triage Notes (Signed)
Pt reports for a couple days having "snot color discharge esp in the mornings". Reports had unprotected sex couple days prior to discharge starting.

## 2022-09-18 NOTE — Discharge Instructions (Signed)
Your genital swab is pending results.  Based on the results you will receive a call from the nurse with recommendations and treatment options.  Your results will also upload into my chart.  Instructions to activate your MyChart is attached to your discharge instructions.  If your symptoms persist or get worse please follow back up with urgent care or with your primary care provider.

## 2022-09-19 LAB — CYTOLOGY, (ORAL, ANAL, URETHRAL) ANCILLARY ONLY
Chlamydia: NEGATIVE
Comment: NEGATIVE
Comment: NEGATIVE
Comment: NORMAL
Neisseria Gonorrhea: NEGATIVE
Trichomonas: NEGATIVE

## 2022-09-20 ENCOUNTER — Telehealth (HOSPITAL_COMMUNITY): Payer: Self-pay | Admitting: Emergency Medicine

## 2022-09-20 NOTE — Telephone Encounter (Signed)
Patient called for results review from recent visit.  Verified identity, provided negative results, no questions at this time

## 2022-09-23 ENCOUNTER — Telehealth: Payer: Self-pay | Admitting: Emergency Medicine

## 2022-09-23 NOTE — Telephone Encounter (Signed)
Patient called and states he is still having symptoms and unsure what to do.  Encouraged f/u visit, patient states he does not have a PCP right now
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
